# Patient Record
Sex: Male | Born: 1940
Health system: Southern US, Community
[De-identification: ages and names within clinical notes are randomized; demographics above are authoritative.]

## PROBLEM LIST (undated history)

## (undated) DIAGNOSIS — E785 Hyperlipidemia, unspecified: Secondary | ICD-10-CM

## (undated) DIAGNOSIS — I10 Essential (primary) hypertension: Secondary | ICD-10-CM

## (undated) DIAGNOSIS — F32A Depression, unspecified: Secondary | ICD-10-CM

## (undated) DIAGNOSIS — I4891 Unspecified atrial fibrillation: Secondary | ICD-10-CM

## (undated) DIAGNOSIS — Z85820 Personal history of malignant melanoma of skin: Secondary | ICD-10-CM

## (undated) DIAGNOSIS — F329 Major depressive disorder, single episode, unspecified: Secondary | ICD-10-CM

## (undated) DIAGNOSIS — N486 Induration penis plastica: Secondary | ICD-10-CM

## (undated) HISTORY — PX: TONSILLECTOMY: SUR1361

## (undated) HISTORY — DX: Hyperlipidemia, unspecified: E78.5

## (undated) HISTORY — DX: Essential (primary) hypertension: I10

## (undated) HISTORY — DX: Personal history of malignant melanoma of skin: Z85.820

## (undated) HISTORY — PX: MELANOMA EXCISION: SHX5266

## (undated) HISTORY — PX: OTHER SURGICAL HISTORY: SHX169

## (undated) HISTORY — DX: Depression, unspecified: F32.A

## (undated) HISTORY — DX: Unspecified atrial fibrillation: I48.91

## (undated) HISTORY — DX: Major depressive disorder, single episode, unspecified: F32.9

## (undated) HISTORY — PX: KNEE SURGERY: SHX244

## (undated) HISTORY — DX: Induration penis plastica: N48.6

---

## 2005-08-27 ENCOUNTER — Ambulatory Visit: Payer: Self-pay | Admitting: Gastroenterology

## 2005-09-07 ENCOUNTER — Ambulatory Visit: Payer: Self-pay | Admitting: Gastroenterology

## 2006-07-07 ENCOUNTER — Emergency Department (HOSPITAL_COMMUNITY): Admission: EM | Admit: 2006-07-07 | Discharge: 2006-07-08 | Payer: Self-pay | Admitting: Emergency Medicine

## 2006-07-07 ENCOUNTER — Ambulatory Visit: Payer: Self-pay | Admitting: *Deleted

## 2006-09-30 ENCOUNTER — Ambulatory Visit: Payer: Self-pay | Admitting: Cardiology

## 2006-10-24 ENCOUNTER — Ambulatory Visit: Payer: Self-pay

## 2007-03-24 ENCOUNTER — Ambulatory Visit: Payer: Self-pay | Admitting: Cardiology

## 2009-05-06 ENCOUNTER — Emergency Department (HOSPITAL_COMMUNITY): Admission: EM | Admit: 2009-05-06 | Discharge: 2009-05-06 | Payer: Self-pay | Admitting: Emergency Medicine

## 2010-03-24 LAB — COMPREHENSIVE METABOLIC PANEL
ALT: 26 U/L (ref 0–53)
AST: 33 U/L (ref 0–37)
Alkaline Phosphatase: 53 U/L (ref 39–117)
CO2: 27 mEq/L (ref 19–32)
Calcium: 9.3 mg/dL (ref 8.4–10.5)
Chloride: 102 mEq/L (ref 96–112)
GFR calc Af Amer: >60 mL/min (ref >60)
Potassium: 3.7 mEq/L (ref 3.5–5.1)
Sodium: 138 mEq/L (ref 135–145)

## 2010-03-24 LAB — LIPASE, BLOOD: Lipase: 28 U/L (ref 11–59)

## 2010-03-24 LAB — URINALYSIS, ROUTINE W REFLEX MICROSCOPIC
Glucose, UA: NEGATIVE mg/dL
Nitrite: NEGATIVE
Protein, ur: NEGATIVE mg/dL

## 2010-03-24 LAB — CBC
HCT: 43.4 % (ref 39.0–52.0)
MCV: 95.1 fL (ref 78.0–100.0)
Platelets: 126 10*3/uL — ABNORMAL LOW (ref 150–400)
WBC: 8.8 10*3/uL (ref 4.0–10.5)

## 2010-03-24 LAB — DIFFERENTIAL
Basophils Absolute: 0 10*3/uL (ref 0.0–0.1)
Eosinophils Absolute: 0 10*3/uL (ref 0.0–0.7)
Eosinophils Relative: 0 % (ref 0–5)
Lymphocytes Relative: 6 % — ABNORMAL LOW (ref 12–46)
Monocytes Absolute: 0.7 10*3/uL (ref 0.1–1.0)
Monocytes Relative: 8 % (ref 3–12)
Neutro Abs: 7.6 10*3/uL (ref 1.7–7.7)
Neutrophils Relative %: 86 % — ABNORMAL HIGH (ref 43–77)

## 2010-05-19 NOTE — Assessment & Plan Note (Signed)
[facility] HEALTHCARE                            CARDIOLOGY OFFICE NOTE   MAXUM, CASSARINO                  MRN:          [id number]  DATE:09/30/2006                            DOB:          1940-04-18    PRIMARY CARE PHYSICIAN:  Dr. Elias Else.   REASON FOR PRESENTATION:  Evaluate patient with atrial fibrillation.   HISTORY OF PRESENT ILLNESS:  The patient presents after an emergency  room visit on July 3 with tachy palpitations documented to be atrial  fibrillation.  He was seen in consultation by our group.  He had had 2  beers and 4 glasses of wine that evening.  This was felt to be related  to the onset of his rapid atrial fibrillation.  He was treated with a  Diltiazem drip.  Subsequently, he had resolution of his arrhythmia.  He  has not had any further tachy palpitations.  He denies any presyncope or  syncope.  He does not exercise routinely, but he works quite a bit in  his yard.  He does heavy lifting.  He denies any chest pressure, neck  discomfort, arm discomfort, activity-induced nausea and vomiting,  excessive diaphoresis.  He has had no shortness of breath, PND or  orthopnea.  He does drink, sometimes, 4 glasses of wine a night or  slightly more.   PAST MEDICAL HISTORY:  1. Hyperlipidemia.  2. Atrial fibrillation in 2003.   PAST SURGICAL HISTORY:  1. Left knee surgery.  2. Tonsillectomy.  3. Polypectomy.   ALLERGIES:  None.   MEDICATIONS:  1. Lipitor 40 mg daily.  2. Aspirin 325 mg daily.   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  He is married.  He occasionally smokes cigars.  He quit  smoking cigarettes 20 years ago.   FAMILY HISTORY:  Noncontributory for coronary disease.   REVIEW OF SYSTEMS:  As stated in the HPI and otherwise negative for  other systems.   PHYSICAL EXAMINATION:  The patient is in no distress.  Blood pressure 147/84, heart rate 62 and regular, weight 168  pounds,  body mass index 26.  HEENT:  Eyelids unremarkable, pupils are equal, round, and reactive to  light, fundi not visualized.  Oral mucosa unremarkable.  NECK:  No jugular venous distension at 45 degrees, carotid upstroke  brisk and symmetrical.  No bruits.  No thyromegaly.  LYMPHATICS:  No cervical, axillary, inguinal adenopathy.  LUNGS:  Clear to auscultation bilaterally.  BACK:  No costovertebral angle tenderness.  CHEST:  Unremarkable.  HEART:  PMI not displaced or sustained.  S1 and S2 are within normal  limits.  No S3, no S4.  No clicks, no rubs, no murmurs.  ABDOMEN:  Flat, positive bowel sounds, normal in frequency and pitch.  No bruits, no rebound, no guarding.  No midline pulsatile mass.  No  hepatomegaly, no splenomegaly.  SKIN:  No rashes, no nodules.  EXTREMITIES:  2+ pulses throughout, no edema.  NEURO:  Oriented to person, place, and time.  Cranial nerves II-XII  grossly intact, motor grossly intact.   EKG sinus  rhythm, rate 63, axis within normal limits, intervals within  normal limits, no acute ST-wave change.   ASSESSMENT AND PLAN:  1. Atrial fibrillation:  The patient had a recurrent paroxysm of this.      This is his 2nd one.  It has been 5 years between bouts.  At this      point I am going to check an echocardiogram.  He is encouraged not      to drink as much alcohol, as this may have contributed.  At this      point, he has no high risk features for an embolism, and would not      meet absolute criteria for Coumadin.  He would not want to take      this.  He is not taking the Cardizem that was prescription for him.      We discussed a pill in pocket approach.  He will let me know fi      he has any further palpitations.  2. Hypertension:  Blood pressure is slightly elevated but this has not      been an issue previously.  He is going to keep an eye on this and      has access to a blood pressure cuff.  If it is slightly elevated at      that point, he  would take the Cardizem.  3. Alcohol:  We did discuss reducing his alcohol intake to a healthier      level.  4. Followup:  To see him back in 6 months or sooner if needed.     [name], MD, [facility]  Electronically Signed    JH/MedQ  DD: 09/30/2006  DT: 10/01/2006  Job #: [id number]   cc:   [name], M.D.

## 2010-05-19 NOTE — Assessment & Plan Note (Signed)
[facility] HEALTHCARE                                 ON-CALL NOTE   Jose Fuentes, Jose Fuentes                       MRN:          [id number]  DATE:07/07/2006                            DOB:          [birth year]    TELEPHONE CONTACT NOTE   Jose Fuentes states that he saw Dr. Antoine Poche approximately 5 years ago  for some sort of arrhythmia.  He cannot remember exactly what it was,  but he stated that Dr. Antoine Poche told him that he did not need to be seen  again, except on a p.r.n. basis, and it would probably never happen  again.  Today, Jose Fuentes states he had onset of tachy palpitations.  He states that they are causing him some weakness and shortness of  breath, and he is a little bit dizzy.  I advised him that he would need  to come to the hospital to be evaluated, and I requested that he call 9-  1-1 to do so, as I did not feel that he was safe to drive and did not  feel comfortable with anyone else driving him.  He was in agreement with  this as a plan of care.      Jose Demark, PA-C  Electronically Signed      Madolyn Frieze. Jens Som, MD, [facility]  Electronically Signed   RB/MedQ  DD: 07/07/2006  DT: 07/08/2006  Job #: [id number]

## 2010-05-19 NOTE — Assessment & Plan Note (Signed)
[facility] HEALTHCARE                            CARDIOLOGY OFFICE NOTE   Jose Fuentes, Jose Fuentes                  MRN:          [id number]  DATE:03/24/2007                            DOB:          1940/05/13    PRIMARY PHYSICIAN:  Jose Fuentes, M.D.   REASON FOR PRESENTATION:  A patient with atrial fibrillation and  hypertension.   HISTORY OF PRESENT ILLNESS:  The patient is 70 years old.  He returns  for followup of the above.  He has not had any palpitations since I last  saw him.  He has not required any Cardizem.  He denies any presyncope or  syncope.  He has had no chest pain or shortness of breath.  He works in  his yard and does activities in his wood shop but does not exercise as  routinely as I would hope.  Of note he has been keeping his blood  pressure diary.  His blood pressures typically run above 140 systolic.   PAST MEDICAL HISTORY:  Hyperlipidemia, hypertension, now being  diagnosed, atrial fibrillation paroxysms, left knee surgery,  tonsillectomy, polypectomy.   ALLERGIES:  None.   MEDICATIONS:  1. Lipitor 40 mg daily at bedtime.  2. Aspirin 325 mg daily.   REVIEW OF SYSTEMS:  As stated in the HPI and otherwise negative for  other systems.   PHYSICAL EXAMINATION:  GENERAL:  The patient is in no distress.  VITAL SIGNS:  Blood pressure 145/78, heart rate 65 and regular, weight  173 pounds.  HEENT:  Eyelids unremarkable.  Pupils equal, round and reactive.  Fundi  not visualized.  Oral mucosa normal.  NECK:  No jugular venous distention at 45 degrees.  Carotid upstroke  brisk and symmetrical.  No bruits.  No thyromegaly.  LYMPHATICS:  No cervical, axillary or inguinal adenopathy.  LUNGS:  Clear to auscultation bilaterally.  BACK:  No costovertebral angle tenderness.  CHEST:  Unremarkable.  HEART:  PMI not displaced or sustained.  S1 and S2 within normal limits.  No S3, no S4, no clicks, rubs, murmurs.  ABDOMEN:  Flat, positive  bowel sounds.  Normal in frequency, pitch, no  bruits, no rebound, no guarding, no midline pulsatile mass.  No  hepatomegaly, no splenomegaly.  SKIN:  No rashes, no nodules.  EXTREMITIES:  2+ pulses throughout.  No edema, cyanosis, clubbing.  NEURO:  Oriented to person, place and time.  Cranial nerves II-XII  grossly intact.  Motor grossly intact.   EKG sinus rhythm, rate 61, axis within normal limits, intervals within  normal limits.  No acute ST-wave changes.   ASSESSMENT AND PLAN:  1. Atrial fibrillation.  The patient has not had any symptomatic      arrhythmias.  No further cardiovascular testing is suggested.  He      has Cardizem at home should he need it for any sustained      symptomatic arrhythmias.  2. Hypertension.  He does have high blood pressure.  Given this I am      going to prescribe hydrochlorothiazide 12.5 mg daily.  We will get  a BMET in 2 weeks and has been given written instructions for this.      We also discussed therapeutic lifestyle changes to include less      alcohol as he is still drinking 4 glasses of wine a night.  This      and increased physical activity can bring down his pressure and may      allow him to come off the drugs in the future.  3. Followup.  He can come back to this clinic as needed.  He will have      his blood pressure followed at Dr. Benjaman Fuentes.     Jose Rotunda, MD, [facility]  Electronically Signed    JH/MedQ  DD: 03/24/2007  DT: 03/24/2007  Job #: [id number]   cc:   Jose Fuentes, M.D.

## 2010-05-19 NOTE — Consult Note (Signed)
NAME:  Jose Fuentes, Jose Fuentes NO.:  [id number]   MEDICAL RECORD NO.:  [id number]          PATIENT TYPE:  EMS   LOCATION:  [facility]                         FACILITY:  [facility]   PHYSICIAN:  Madolyn Frieze. Jens Som, MD, FACCDATE OF BIRTH:  [birth year]   DATE OF CONSULTATION:  07/08/2006  DATE OF DISCHARGE:                                 CONSULTATION   CHIEF COMPLAINT:  Palpitations.   HISTORY OF PRESENT ILLNESS:  This is a 70 year old gentleman with a  history of hyperlipidemia, who approximately 5 years ago had an episode  of atrial fibrillation that was self-limited while helping his mom move  in [place].  He says at that time he was under a significant amount of  stress dealing with the move with his mother.  He was seen in the  emergency room after developing palpitations and was diagnosed with  atrial fibrillation.  Received a shot of what sounds like probably  diltiazem and at that time cardioverted back into sinus rhythm.  When he  returned back home here to the [place] area, he saw Dr. Antoine Poche who  followed the patient and stated that most likely the patient would never  have another episode.  The patient states in the interim he has had  occasion episodes of self-limited palpitations lasting only a couple of  seconds.   This evening, the patient had 2 beers earlier in the day and 4 glasses  of wine and shortly thereafter developed the onset of palpitations.  They lasted for more than 5 minutes and he became a little bit concerned  as a result.  He told his wife and they came to the emergency  department.  In the emergency department, he was found to be in atrial  fibrillation with a rapid ventricular response with a ventricular rate  in the 150s.  As a result, he was started on a diltiazem drip with  adequate rate control of his atrial fibrillation and resolution of the  majority of his symptoms.  He denies any shortness of breath, chest  pain, lightheadedness, dizziness,  focal weakness, slurred speech, or any  vision loss.  At this point in time, he denies any other symptoms at all  such as nausea, vomiting, diaphoresis.  Additionally he denies any  problems with dyspnea on exertion at baseline, chest pain or angina,  lower extremity edema, orthopnea, or PND.  Additionally, the patient has  a plane ticket to go to [place] tomorrow at 2 p.m. that he is not  interested in rescheduling.   REVIEW OF SYSTEMS:  As above in the HPI.  The remaining 8-point review  of systems is negative.   ALLERGIES:  None.   PAST MEDICAL HISTORY:  1. Hyperlipidemia.  2. Remote history of atrial fibrillation.   CURRENT MEDICATIONS:  Aspirin 325 mg daily, Lipitor daily.   SOCIAL HISTORY:  He is an occasional drinker.  He denies any tobacco  abuse.  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  He lives with his wife.   FAMILY HISTORY:  Noncontributory.   PHYSICAL EXAMINATION:  VITAL SIGNS:  Blood pressure is 121/71, heart  rate is between 79 and 100 currently in atrial fibrillation, O2  saturations are 99% on room air.  He is afebrile.  GENERAL:  He is alert, oriented x3.  No acute distress.  HEENT EXAM:  Normocephalic, atraumatic.  Pupils equal, round, and  reactive to light.  Oropharynx is clear.  Sclerae:  Anicteric.  NECK:  JVP is flat.  No carotid bruits, with 2+ carotids and pulses  symmetric bilaterally.  LUNGS:  Clear to auscultation bilaterally without any wheezes, rhonchi,  or rales.  CARDIOVASCULAR EXAM:  Irregularly irregular, normal S2, without any  murmurs, rubs, or gallops.  ABDOMEN:  Soft, nontender, nondistended, with positive bowel sounds.  EXTREMITY EXAM:  With 2+ radial and posterior tibialis pulses symmetric  bilaterally without any lower extremity edema, clubbing, or cyanosis.  NEUROLOGIC EXAM:  Alert, oriented x3.  Cranial nerves, III through XII,  are intact.  He has 5/5 motor strength throughout.   LABORATORY  DATA:  Cardiac enzymes are negative.  Chem-7 is only  remarkable for a potassium of 3.6.  The remainder of his electrolytes  are within normal limits.  CBC is unremarkable.  A 12-lead EKG initially  showed atrial fibrillation with a ventricular rate of 152, with a normal  axis, without any identifiable Q waves or T wave abnormalities.  He has  some small ST depression in the inferior lateral lead that are difficult  to interpret given his rate.   ASSESSMENT:  1. History of paroxysmal atrial fibrillation, now with a diagnosis of      holiday heart.  2. Hyperlipidemia.   RECOMMENDATIONS:  This patient comes in with a paroxysm of atrial  fibrillation.  It has been controlled now with IV diltiazem in the  emergency department.  He has had a prior history of a diagnosis of  atrial fibrillation.  He currently has no significant risk factors for  stroke, other than his atrial fibrillation.  As a result, it would be  reasonable to continue to treat this gentleman as he is with aspirin 325  mg a day.  At this point, he needs some additional help with rate  control.  As a result, I have sent him home with a prescription of  diltiazem 30 mg p.o. every 6 hours p.r.n. until his heart rate remains  less than 100 or he is back in a regular rhythm.  I have given him  instructions on how to use this medication as well as to continue with  the full-dose aspirin.  I have told him that it would be safe for him to  continue on his trip tomorrow if he felt necessary.  Given I think that  most likely he will cardiovert back on his own and he already has stroke  prophylaxis on board, I have asked him to return to see Dr. Antoine Poche for  follow up as an outpatient.     ______________________________  Eston Esters. Sherryll Burger, MD      Madolyn Frieze. Jens Som, MD, [facility]  Electronically Signed    BRS/MEDQ  D:  07/08/2006  T:  07/08/2006  Job:  [id number]   cc:   [name], MD, [facility]

## 2010-10-20 LAB — DIFFERENTIAL
Basophils Relative: 1
Lymphocytes Relative: 31
Lymphs Abs: 1.6
Neutro Abs: 2.8
Neutrophils Relative %: 56

## 2010-10-20 LAB — CBC
HCT: 46.8
Hemoglobin: 15.9
MCHC: 33.9
MCV: 93.1
Platelets: 152
RBC: 5.02

## 2010-10-20 LAB — I-STAT 8, (EC8 V) (CONVERTED LAB)
HCT: 51
Operator id: 284251
Potassium: 3.6
Sodium: 142
TCO2: 22
pCO2, Ven: 33.2 — ABNORMAL LOW
pH, Ven: 7.411 — ABNORMAL HIGH

## 2010-10-20 LAB — POCT CARDIAC MARKERS
CKMB, poc: 1.5
Myoglobin, poc: 44.8
Troponin i, poc: <0.05

## 2010-10-20 LAB — POCT I-STAT CREATININE
Creatinine, Ser: 0.9
Operator id: 284251

## 2011-02-23 ENCOUNTER — Other Ambulatory Visit: Payer: Self-pay | Admitting: *Deleted

## 2011-06-08 DIAGNOSIS — I10 Essential (primary) hypertension: Secondary | ICD-10-CM | POA: Diagnosis not present

## 2011-06-08 DIAGNOSIS — Z1331 Encounter for screening for depression: Secondary | ICD-10-CM | POA: Diagnosis not present

## 2011-06-08 DIAGNOSIS — F329 Major depressive disorder, single episode, unspecified: Secondary | ICD-10-CM | POA: Diagnosis not present

## 2011-06-08 DIAGNOSIS — E785 Hyperlipidemia, unspecified: Secondary | ICD-10-CM | POA: Diagnosis not present

## 2011-08-06 DIAGNOSIS — E785 Hyperlipidemia, unspecified: Secondary | ICD-10-CM | POA: Diagnosis not present

## 2011-08-06 DIAGNOSIS — I10 Essential (primary) hypertension: Secondary | ICD-10-CM | POA: Diagnosis not present

## 2011-09-10 DIAGNOSIS — H11449 Conjunctival cysts, unspecified eye: Secondary | ICD-10-CM | POA: Diagnosis not present

## 2011-09-10 DIAGNOSIS — H5210 Myopia, unspecified eye: Secondary | ICD-10-CM | POA: Diagnosis not present

## 2011-09-10 DIAGNOSIS — H02059 Trichiasis without entropian unspecified eye, unspecified eyelid: Secondary | ICD-10-CM | POA: Diagnosis not present

## 2011-09-10 DIAGNOSIS — H2510 Age-related nuclear cataract, unspecified eye: Secondary | ICD-10-CM | POA: Diagnosis not present

## 2011-09-20 DIAGNOSIS — Z23 Encounter for immunization: Secondary | ICD-10-CM | POA: Diagnosis not present

## 2011-10-04 DIAGNOSIS — H02059 Trichiasis without entropian unspecified eye, unspecified eyelid: Secondary | ICD-10-CM | POA: Diagnosis not present

## 2014-01-14 DIAGNOSIS — H43813 Vitreous degeneration, bilateral: Secondary | ICD-10-CM | POA: Diagnosis not present

## 2014-01-14 DIAGNOSIS — H5310 Unspecified subjective visual disturbances: Secondary | ICD-10-CM | POA: Diagnosis not present

## 2014-01-14 DIAGNOSIS — H2513 Age-related nuclear cataract, bilateral: Secondary | ICD-10-CM | POA: Diagnosis not present

## 2014-01-28 DIAGNOSIS — H43812 Vitreous degeneration, left eye: Secondary | ICD-10-CM | POA: Diagnosis not present

## 2014-02-15 DIAGNOSIS — H43812 Vitreous degeneration, left eye: Secondary | ICD-10-CM | POA: Diagnosis not present

## 2014-04-29 DIAGNOSIS — R7301 Impaired fasting glucose: Secondary | ICD-10-CM | POA: Diagnosis not present

## 2014-04-29 DIAGNOSIS — B001 Herpesviral vesicular dermatitis: Secondary | ICD-10-CM | POA: Diagnosis not present

## 2014-04-29 DIAGNOSIS — D696 Thrombocytopenia, unspecified: Secondary | ICD-10-CM | POA: Diagnosis not present

## 2014-04-29 DIAGNOSIS — Z0000 Encounter for general adult medical examination without abnormal findings: Secondary | ICD-10-CM | POA: Diagnosis not present

## 2014-04-29 DIAGNOSIS — Z1211 Encounter for screening for malignant neoplasm of colon: Secondary | ICD-10-CM | POA: Diagnosis not present

## 2014-04-29 DIAGNOSIS — I10 Essential (primary) hypertension: Secondary | ICD-10-CM | POA: Diagnosis not present

## 2014-04-29 DIAGNOSIS — E785 Hyperlipidemia, unspecified: Secondary | ICD-10-CM | POA: Diagnosis not present

## 2014-04-29 DIAGNOSIS — Z125 Encounter for screening for malignant neoplasm of prostate: Secondary | ICD-10-CM | POA: Diagnosis not present

## 2014-04-29 DIAGNOSIS — F325 Major depressive disorder, single episode, in full remission: Secondary | ICD-10-CM | POA: Diagnosis not present

## 2014-04-29 DIAGNOSIS — I4891 Unspecified atrial fibrillation: Secondary | ICD-10-CM | POA: Diagnosis not present

## 2014-04-30 ENCOUNTER — Telehealth: Payer: Self-pay | Admitting: Cardiology

## 2014-04-30 NOTE — Telephone Encounter (Signed)
Received fax referral packet 10 pages from [place] at [facility] for up coming appointment on 06/11/2014. Records given to [facility].

## 2014-05-02 ENCOUNTER — Telehealth: Payer: Self-pay | Admitting: Gastroenterology

## 2014-05-02 NOTE — Telephone Encounter (Signed)
Dr. Ardis Hughs reviewed pt's last procedure with Dr. Velora Heckler.  Pt will be due for colonoscopy in 09/2015.  Told wife we would send a letter when that schedule comes out.

## 2014-06-10 ENCOUNTER — Telehealth: Payer: Self-pay | Admitting: Cardiology

## 2014-06-10 NOTE — Telephone Encounter (Signed)
Received records from [place] for appointment on 06/11/14 with Dr Percival Spanish. Records given to [facility] (medical records) for Dr Hochrein's schedule on 06/11/14. lp

## 2014-06-11 ENCOUNTER — Ambulatory Visit (INDEPENDENT_AMBULATORY_CARE_PROVIDER_SITE_OTHER): Payer: Medicare Other | Admitting: Cardiology

## 2014-06-11 ENCOUNTER — Encounter: Payer: Self-pay | Admitting: Cardiology

## 2014-06-11 VITALS — BP 128/70 | HR 63 | Ht 66.5 in | Wt 167.6 lb

## 2014-06-11 DIAGNOSIS — I480 Paroxysmal atrial fibrillation: Secondary | ICD-10-CM | POA: Insufficient documentation

## 2014-06-11 DIAGNOSIS — I4891 Unspecified atrial fibrillation: Secondary | ICD-10-CM | POA: Diagnosis not present

## 2014-06-11 NOTE — Patient Instructions (Signed)
Your physician recommends that you schedule a follow-up appointment in: one year with Dr. Hochrein  

## 2014-06-11 NOTE — Progress Notes (Signed)
Cardiology Office Note   Date:  06/11/2014   ID:  Griff, Badley [birth year], MRN [id number]  PCP:  Vidal Schwalbe, MD  Cardiologist:   Minus Breeding, MD   Chief Complaint  Patient presents with  . Atrial Fibrillation      History of Present Illness: Jose Fuentes is a 74 y.o. male who presents for the patient presents for follow up of atrial fib.  He has had sustained episodes of this twice in the past but not since I saw him last.  He has done well.  The patient denies any new symptoms such as chest discomfort, neck or arm discomfort. There has been no new shortness of breath, PND or orthopnea. There have been no reported palpitations, presyncope or syncope.  He stays active in two properties.      No past medical history on file.  No past surgical history on file.   Current Outpatient Prescriptions  Medication Sig Dispense Refill  . aspirin 325 MG tablet Take 325 mg by mouth daily.    Marland Kitchen atorvastatin (LIPITOR) 80 MG tablet Take 1 tablet by mouth daily.    Marland Kitchen buPROPion (WELLBUTRIN XL) 300 MG 24 hr tablet Take 1 tablet by mouth.    . cholecalciferol (VITAMIN D) 1000 UNITS tablet Take 1,000 Units by mouth 2 (two) times daily.    Marland Kitchen diltiazem (CARDIZEM) 60 MG tablet Take 60 mg by mouth 4 (four) times daily.    . hydrochlorothiazide (MICROZIDE) 12.5 MG capsule Take 1 capsule by mouth daily.    . vitamin B-12 (CYANOCOBALAMIN) 1000 MCG tablet Take 1,000 mcg by mouth daily.    Marland Kitchen ZETIA 10 MG tablet Take 1 tablet by mouth daily.     No current facility-administered medications for this visit.    Allergies:   Review of patient's allergies indicates not on file.    Social History:  The patient  reports that he has never smoked. He does not have any smokeless tobacco history on file. He reports that he does not use illicit drugs.   Family History:  The patient's family history includes Brain cancer in an other family member; Heart attack in his father.    ROS:   Please see the history of present illness.   Otherwise, review of systems are positive for none.   All other systems are reviewed and negative.    PHYSICAL EXAM: VS:  BP 128/70 mmHg  Pulse 63  Ht 5' 6.5" (1.689 m)  Wt 167 lb 9.6 oz (76.023 kg)  BMI 26.65 kg/m2 , BMI Body mass index is 26.65 kg/(m^2). GENERAL:  Well appearing HEENT:  Pupils equal round and reactive, fundi not visualized, oral mucosa unremarkable NECK:  No jugular venous distention, waveform within normal limits, carotid upstroke brisk and symmetric, no bruits, no thyromegaly LYMPHATICS:  No cervical, inguinal adenopathy LUNGS:  Clear to auscultation bilaterally BACK:  No CVA tenderness CHEST:  Unremarkable HEART:  PMI not displaced or sustained,S1 and S2 within normal limits, no S3, no S4, no clicks, no rubs, no murmurs ABD:  Flat, positive bowel sounds normal in frequency in pitch, no bruits, no rebound, no guarding, no midline pulsatile mass, no hepatomegaly, no splenomegaly EXT:  2 plus pulses throughout, no edema, no cyanosis no clubbing SKIN:  No rashes no nodules NEURO:  Cranial nerves II through XII grossly intact, motor grossly intact throughout PSYCH:  Cognitively intact, oriented to person place and time    EKG:  EKG is ordered today.  The ekg ordered today demonstrates sinus rhythm, rate 63, axis within normal limits, intervals within normal limits, no acute ST-T wave changes.   Recent Labs: No results found for requested labs within last 365 days.    Lipid Panel No results found for: CHOL, TRIG, HDL, CHOLHDL, VLDL, LDLCALC, LDLDIRECT    Wt Readings from Last 3 Encounters:  06/11/14 167 lb 9.6 oz (76.023 kg)      Other studies Reviewed: Additional studies/ records that were reviewed today include: Office records. Review of the above records demonstrates:  Please see elsewhere in the note.     ASSESSMENT AND PLAN:  ATRIAL FIB:  The patient denies any recent episodes of sustained dysrhythmia.  No change in therapy is indicated. No further testing is planned.  DYSLIPIDEMIA:  We talked about this for quite a while. Because his last LDL was 163 I believe he should be on a statin. He hadn't been taking his Lipitor consistently and he has not started this. I think he should stop his Zetia. He can get his lipid profile checked again in about 10 weeks. If he's not at target of an LDL less than 100 I would put him on a higher dose of statin or change to Crestor.   Current medicines are reviewed at length with the patient today.  The patient does not have concerns regarding medicines.  The following changes have been made:  Stop Zetia  Labs/ tests ordered today include:   Orders Placed This Encounter  Procedures  . EKG 12-Lead     Disposition:   FU with me as needed    Signed, Minus Breeding, MD  06/11/2014 5:42 PM    [facility]

## 2014-06-24 DIAGNOSIS — Z1211 Encounter for screening for malignant neoplasm of colon: Secondary | ICD-10-CM | POA: Diagnosis not present

## 2014-08-23 DIAGNOSIS — H43812 Vitreous degeneration, left eye: Secondary | ICD-10-CM | POA: Diagnosis not present

## 2014-09-12 ENCOUNTER — Ambulatory Visit
Admission: RE | Admit: 2014-09-12 | Discharge: 2014-09-12 | Disposition: A | Discharge status: Home / Self Care | Payer: Medicare Other | Source: Ambulatory Visit | Attending: Family Medicine | Admitting: Family Medicine

## 2014-09-12 ENCOUNTER — Other Ambulatory Visit: Payer: Self-pay | Admitting: Family Medicine

## 2014-09-12 DIAGNOSIS — S2239XA Fracture of one rib, unspecified side, initial encounter for closed fracture: Secondary | ICD-10-CM | POA: Diagnosis not present

## 2014-09-12 DIAGNOSIS — S2232XA Fracture of one rib, left side, initial encounter for closed fracture: Secondary | ICD-10-CM

## 2014-09-12 DIAGNOSIS — R0781 Pleurodynia: Secondary | ICD-10-CM | POA: Diagnosis not present

## 2014-09-12 DIAGNOSIS — R079 Chest pain, unspecified: Secondary | ICD-10-CM | POA: Diagnosis not present

## 2014-09-12 DIAGNOSIS — T148 Other injury of unspecified body region: Secondary | ICD-10-CM | POA: Diagnosis not present

## 2014-09-23 DIAGNOSIS — E785 Hyperlipidemia, unspecified: Secondary | ICD-10-CM | POA: Diagnosis not present

## 2014-10-28 DIAGNOSIS — D696 Thrombocytopenia, unspecified: Secondary | ICD-10-CM | POA: Diagnosis not present

## 2014-10-28 DIAGNOSIS — E785 Hyperlipidemia, unspecified: Secondary | ICD-10-CM | POA: Diagnosis not present

## 2014-10-28 DIAGNOSIS — I10 Essential (primary) hypertension: Secondary | ICD-10-CM | POA: Diagnosis not present

## 2014-10-28 DIAGNOSIS — Z23 Encounter for immunization: Secondary | ICD-10-CM | POA: Diagnosis not present

## 2014-10-28 DIAGNOSIS — R7301 Impaired fasting glucose: Secondary | ICD-10-CM | POA: Diagnosis not present

## 2014-10-28 DIAGNOSIS — F339 Major depressive disorder, recurrent, unspecified: Secondary | ICD-10-CM | POA: Diagnosis not present

## 2014-11-01 DIAGNOSIS — H43812 Vitreous degeneration, left eye: Secondary | ICD-10-CM | POA: Diagnosis not present

## 2014-11-01 DIAGNOSIS — Z0100 Encounter for examination of eyes and vision without abnormal findings: Secondary | ICD-10-CM | POA: Diagnosis not present

## 2014-11-01 DIAGNOSIS — H2513 Age-related nuclear cataract, bilateral: Secondary | ICD-10-CM | POA: Diagnosis not present

## 2014-12-02 DIAGNOSIS — F324 Major depressive disorder, single episode, in partial remission: Secondary | ICD-10-CM | POA: Diagnosis not present

## 2014-12-02 DIAGNOSIS — I10 Essential (primary) hypertension: Secondary | ICD-10-CM | POA: Diagnosis not present

## 2015-01-20 DIAGNOSIS — F325 Major depressive disorder, single episode, in full remission: Secondary | ICD-10-CM | POA: Diagnosis not present

## 2015-01-20 DIAGNOSIS — R002 Palpitations: Secondary | ICD-10-CM | POA: Diagnosis not present

## 2015-03-24 DIAGNOSIS — L720 Epidermal cyst: Secondary | ICD-10-CM | POA: Diagnosis not present

## 2015-03-24 DIAGNOSIS — D2261 Melanocytic nevi of right upper limb, including shoulder: Secondary | ICD-10-CM | POA: Diagnosis not present

## 2015-03-24 DIAGNOSIS — D1801 Hemangioma of skin and subcutaneous tissue: Secondary | ICD-10-CM | POA: Diagnosis not present

## 2015-03-24 DIAGNOSIS — D485 Neoplasm of uncertain behavior of skin: Secondary | ICD-10-CM | POA: Diagnosis not present

## 2015-03-24 DIAGNOSIS — D2272 Melanocytic nevi of left lower limb, including hip: Secondary | ICD-10-CM | POA: Diagnosis not present

## 2015-03-24 DIAGNOSIS — L738 Other specified follicular disorders: Secondary | ICD-10-CM | POA: Diagnosis not present

## 2015-03-24 DIAGNOSIS — L570 Actinic keratosis: Secondary | ICD-10-CM | POA: Diagnosis not present

## 2015-03-24 DIAGNOSIS — D2262 Melanocytic nevi of left upper limb, including shoulder: Secondary | ICD-10-CM | POA: Diagnosis not present

## 2015-03-24 DIAGNOSIS — D692 Other nonthrombocytopenic purpura: Secondary | ICD-10-CM | POA: Diagnosis not present

## 2015-03-24 DIAGNOSIS — L814 Other melanin hyperpigmentation: Secondary | ICD-10-CM | POA: Diagnosis not present

## 2015-03-24 DIAGNOSIS — Z85820 Personal history of malignant melanoma of skin: Secondary | ICD-10-CM | POA: Diagnosis not present

## 2015-03-24 DIAGNOSIS — L821 Other seborrheic keratosis: Secondary | ICD-10-CM | POA: Diagnosis not present

## 2015-03-24 DIAGNOSIS — D225 Melanocytic nevi of trunk: Secondary | ICD-10-CM | POA: Diagnosis not present

## 2015-03-31 DIAGNOSIS — H43811 Vitreous degeneration, right eye: Secondary | ICD-10-CM | POA: Diagnosis not present

## 2015-04-25 DIAGNOSIS — H43811 Vitreous degeneration, right eye: Secondary | ICD-10-CM | POA: Diagnosis not present

## 2015-04-29 DIAGNOSIS — H43813 Vitreous degeneration, bilateral: Secondary | ICD-10-CM | POA: Diagnosis not present

## 2015-04-29 DIAGNOSIS — H33101 Unspecified retinoschisis, right eye: Secondary | ICD-10-CM | POA: Diagnosis not present

## 2015-05-02 DIAGNOSIS — R29818 Other symptoms and signs involving the nervous system: Secondary | ICD-10-CM | POA: Diagnosis not present

## 2015-05-02 DIAGNOSIS — F325 Major depressive disorder, single episode, in full remission: Secondary | ICD-10-CM | POA: Diagnosis not present

## 2015-05-02 DIAGNOSIS — I10 Essential (primary) hypertension: Secondary | ICD-10-CM | POA: Diagnosis not present

## 2015-05-02 DIAGNOSIS — R296 Repeated falls: Secondary | ICD-10-CM | POA: Diagnosis not present

## 2015-05-02 DIAGNOSIS — R7309 Other abnormal glucose: Secondary | ICD-10-CM | POA: Diagnosis not present

## 2015-05-26 ENCOUNTER — Ambulatory Visit: Payer: Medicare Other | Attending: Family Medicine | Admitting: Physical Therapy

## 2015-05-26 ENCOUNTER — Encounter: Payer: Self-pay | Admitting: Physical Therapy

## 2015-05-26 DIAGNOSIS — R2689 Other abnormalities of gait and mobility: Secondary | ICD-10-CM | POA: Diagnosis not present

## 2015-05-26 DIAGNOSIS — R2681 Unsteadiness on feet: Secondary | ICD-10-CM | POA: Insufficient documentation

## 2015-05-26 DIAGNOSIS — M6281 Muscle weakness (generalized): Secondary | ICD-10-CM | POA: Diagnosis not present

## 2015-05-26 NOTE — Therapy (Signed)
[facility] [address] [place], [place], [address] Phone: [phone]   Fax:  [phone]  Physical Therapy Evaluation  Patient Details  Name: Jose Fuentes MRN: [id number] Date of Birth: [birth year] Referring Provider: Harlan Stains, MD  Encounter Date: 05/26/2015      PT End of Session - 05/26/15 1217    Visit Number 1   Number of Visits 18   Date for PT Re-Evaluation 07/25/15   Authorization Type Medicare need G codes   PT Start Time 0932   PT Stop Time 1016   PT Time Calculation (min) 44 min   Equipment Utilized During Treatment Gait belt   Activity Tolerance Patient tolerated treatment well;No increased pain   Behavior During Therapy [facility] for tasks assessed/performed      Past Medical History  Diagnosis Date  . Hyperlipidemia   . Atrial fibrillation ([place])   . Peyronie disease   . Hypertension     Past Surgical History  Procedure Laterality Date  . Knee surgery    . Tonsillectomy    . Thumb replacement      There were no vitals filed for this visit.       Subjective Assessment - 05/26/15 0938    Subjective Pt is a 75 yr old male here for an recent increase in falls over the past 2 weeks, patient reports 5 falls in those 2 weeks. Pt believe increase in falls is due to medications. He notices the falls more in narrow spaces and "walking on walls". He states he is stumbling and losing his balance in when he is thinking about something else such as carrying an object.  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 No/denies             [facility] PT Assessment - 05/26/15 0001    Assessment   Medical Diagnosis Frequent Falls   Referring Provider Harlan Stains, MD   Hand Dominance Left   Precautions   Precautions None   Restrictions   Weight Bearing Restrictions No   Balance Screen   Has the patient fallen in the past 6 months Yes   How many times? 7  Falls increased in 2 weeks   Has the patient had a decrease in activity level because of a fear of falling?  No   Is the patient reluctant to leave their home because of a fear of falling?  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   Cognition   Overall Cognitive Status Within Functional Limits for tasks assessed  Observation/Other Assessments   Focus on Therapeutic Outcomes (FOTO)  92.60 Functional Status  53 Risk Adjusted   Activities of Balance Confidence Scale (ABC Scale)  86.3%   Fear Avoidance Belief Questionnaire (FABQ)  19 (5)   Sensation   Light Touch --  hand numbness after sleeping   Functional Tests   Functional tests Other  Heel Raises 5x, less coordinated and weaker on R   Posture/Postural Control   Posture/Postural Control Postural limitations   Postural Limitations Forward head;Increased thoracic kyphosis   ROM / Strength   AROM / PROM / Strength Strength   Strength   Strength Assessment Site Hip;Knee;Ankle   Right/Left Hip Right;Left   Right Hip Extension 4+/5   Right Hip ABduction 4+/5   Left Hip Extension 4+/5    Left Hip ABduction 4+/5   Right/Left Knee Right;Left   Right Knee Flexion 5/5   Right Knee Extension 5/5   Left Knee Flexion 5/5   Left Knee Extension 5/5   Right/Left Ankle Right;Left   Right Ankle Dorsiflexion 4-/5   Right Ankle Plantar Flexion 4/5   Left Ankle Dorsiflexion 5/5   Left Ankle Plantar Flexion 5/5   Ambulation/Gait   Ambulation/Gait Yes   Ambulation/Gait Assistance 5: Supervision   Ambulation/Gait Assistance Details Pt able to ambulate with no loss of balance except with object negiotion and head turns   Ambulation Distance (Feet) 300 Feet   Assistive device None   Gait Pattern Step-through pattern;Decreased arm swing - left;Decreased arm swing - right;Trunk flexed;Wide base of support   Ambulation Surface Level;Indoor   Gait velocity 4.18 ft/s   Gait velocity - backwards --   Stairs Yes   Stairs Assistance 7: Independent   Stair Management Technique No rails;Alternating pattern;Forwards   Number of Stairs 4   Height of Stairs 6   Standardized Balance Assessment   Standardized Balance Assessment Berg Balance Test   Berg Balance Test   Sit to Stand Able to stand without using hands and stabilize independently   Standing Unsupported Able to stand safely 2 minutes   Sitting with Back Unsupported but Feet Supported on Floor or Stool Able to sit safely and securely 2 minutes   Stand to Sit Sits safely with minimal use of hands   Transfers Able to transfer safely, minor use of hands   Standing Unsupported with Eyes Closed Able to stand 10 seconds safely   Standing Ubsupported with Feet Together Able to place feet together independently and stand 1 minute safely   From Standing, Reach Forward with Outstretched Arm Can reach confidently >25 cm (10")   From Standing Position, Pick up Object from Floor Able to pick up shoe safely and easily   From Standing Position, Turn to Look Behind Over each Shoulder Looks behind from both sides and weight shifts well   Turn 360  Degrees Able to turn 360 degrees safely but slowly   Standing Unsupported, Alternately Place Feet on Step/Stool Able to complete >2 steps/needs minimal assist   Standing Unsupported, One Foot in Front Able to take small step independently and hold 30 seconds   Standing on One Leg Able to lift leg independently and hold 5-10 seconds   Total Score 48   Functional Gait  Assessment   Gait assessed  Yes   Gait Level Surface Walks 20 ft in less than 5.5 sec, no assistive devices, good speed, no evidence for imbalance, normal gait pattern, deviates no more than 6 in outside of the 12 in walkway  width.   Change in Gait Speed Makes only minor adjustments to walking speed, or accomplishes a change in speed with significant gait deviations, deviates 10-15 in outside the 12 in walkway width, or changes speed but loses balance but is able to recover and continue walking.   Gait with Horizontal Head Turns Performs head turns smoothly with slight change in gait velocity (eg, minor disruption to smooth gait path), deviates 6-10 in outside 12 in walkway width, or uses an assistive device.   Gait with Vertical Head Turns Performs task with slight change in gait velocity (eg, minor disruption to smooth gait path), deviates 6 - 10 in outside 12 in walkway width or uses assistive device   Gait and Pivot Turn Pivot turns safely in greater than 3 sec and stops with no loss of balance, or pivot turns safely within 3 sec and stops with mild imbalance, requires small steps to catch balance.   Step Over Obstacle Is able to step over one shoe box (4.5 in total height) without changing gait speed. No evidence of imbalance.   Gait with Narrow Base of Support Ambulates less than 4 steps heel to toe or cannot perform without assistance.   Gait with Eyes Closed Walks 20 ft, slow speed, abnormal gait pattern, evidence for imbalance, deviates 10-15 in outside 12 in walkway width. Requires more than 9 sec to ambulate 20 ft.   Ambulating  Backwards Walks 20 ft, slow speed, abnormal gait pattern, evidence for imbalance, deviates 10-15 in outside 12 in walkway width.   Steps Alternating feet, no rail.   Total Score 17                           PT Education - 05/26/15 1216    Education provided Yes   Education Details Physical therapy services and remaining active    Person(s) Educated Patient   Methods Explanation   Comprehension Verbalized understanding          PT Short Term Goals - 05/26/15 1500    PT SHORT TERM GOAL #1   Title Patient is independent with initial HEP. (Target Date: 06/25/2015)   Time 1   Period Months   Status New   PT SHORT TERM GOAL #2   Title Patient ambulates and scans environment while maintaining path & pace. (Target Date: 06/25/2015)   Time 1   Period Months   Status New   PT SHORT TERM GOAL #3   Title Patient able to stand tandem for >30 seconds safely to indicate improvement in balance. (Target Date: 06/25/2015)   Time 1   Period Months   Status New           PT Long Term Goals - 05/26/15 1321    PT LONG TERM GOAL #1   Title Pt will be able to ambulate 1000' outside surfaces independently (Target Date: 07/25/2015)   Time 2   Period Months   Status New   PT LONG TERM GOAL #2   Title Pt will improve FGA to 30/30 to enable community ambulation and functional gait (Target Date: 07/25/2015)   Baseline --   Time 2   Period Months   Status New   PT LONG TERM GOAL #3   Title Pt will improve Berg Balance Score >/= 5456 to indicate decrease in fall risk (Target Date: 07/25/2015)   Baseline --   Time 2   Period Months   Status New   PT  LONG TERM GOAL #4   Title Patient is independent in ongoing HEP / fitness plan. (Target Date: 07/25/2015)   Baseline --   Time 2   Period Months   Status New               Plan - 06/01/15 1218    Clinical Impression Statement Pt is a 75 yr old male here for frequent falls onset 2 weeks ago but has stated he feels he  has gotten clumsier over the lat few years. Patients Berg Balance Score= 48/56 and  FGA 19/30 which is predictive for future falls in community dwelling adults, he has increased diffculty with dynamic balance activies and dual tasking, such as stepping over objects and head turns with gait. Strength weakness noted in right hip, R dorsiflexion and plantarflexion and patient reports sensory changes to right hand. These deficits have limited his ability to function within the home and the community. Skilled PT is needed to address R sided fucnitonal weakness and balance deficits in order to increased mobility in the community and home.    Rehab Potential Good   Clinical Impairments Affecting Rehab Potential Pt is active and wants to get better   PT Frequency 2x / week   PT Duration 4 weeks   PT Treatment/Interventions ADLs/Self Care Home Management;Therapeutic exercise;Therapeutic activities;Functional mobility training;Stair training;Gait training;DME Instruction;Balance training;Neuromuscular re-education;Patient/family education;Orthotic Fit/Training;Manual techniques   PT Next Visit Plan orthostatic hypotension test, dynamic high level balance activities, unlevel terrian   Consulted and Agree with Plan of Care Patient      Patient will benefit from skilled therapeutic intervention in order to improve the following deficits and impairments:  Decreased balance, Decreased coordination, Decreased mobility, Decreased strength, Postural dysfunction, Abnormal gait  Visit Diagnosis: Other abnormalities of gait and mobility  Unsteadiness on feet  Muscle weakness (generalized)      G-Codes - 06-01-15 1500    Functional Assessment Tool Used Berg Balance 48/56 & Functional Gait Assessment 19/30   Functional Limitation Mobility: Walking and moving around   Mobility: Walking and Moving Around Current Status [phone]) At least 20 percent but less than 40 percent impaired, limited or restricted   Mobility:  Walking and Moving Around Goal Status [phone]) At least 1 percent but less than 20 percent impaired, limited or restricted       Problem List Patient Active Problem List   Diagnosis Date Noted  . Atrial fibrillation ([place]) 06/11/2014   Dillard Essex, SPT 06/01/15, 1:34 PM  Initial Evaluation completed. Please sign and either place in EPIC or FAX back to [place], PT, DPT PT Specializing in [place] Phone:  [phone]  Fax:  [phone] [place], [place] [address]   [facility] [address] [place], [place], [address] Phone: [phone]   Fax:  [phone]  Name: Jose Fuentes MRN: [id number] Date of Birth: 04/21/1940

## 2015-05-28 ENCOUNTER — Encounter: Payer: Self-pay | Admitting: Physical Therapy

## 2015-05-28 ENCOUNTER — Ambulatory Visit: Payer: Medicare Other | Admitting: Physical Therapy

## 2015-05-28 DIAGNOSIS — M6281 Muscle weakness (generalized): Secondary | ICD-10-CM

## 2015-05-28 DIAGNOSIS — R2681 Unsteadiness on feet: Secondary | ICD-10-CM

## 2015-05-28 DIAGNOSIS — R2689 Other abnormalities of gait and mobility: Secondary | ICD-10-CM | POA: Diagnosis not present

## 2015-05-28 NOTE — Therapy (Signed)
[facility] [address] [place], [place], [address] Phone: [phone]   Fax:  [phone]  Physical Therapy Treatment  Patient Details  Name: Jose Fuentes MRN: [id number] Date of Birth: August 07, 1940 Referring Provider: Harlan Stains, MD  Encounter Date: 05/28/2015      PT End of Session - 05/28/15 1012    Visit Number 2   Number of Visits 18   Date for PT Re-Evaluation 07/25/15   Authorization Type Medicare need G codes   PT Start Time 0932   PT Stop Time 1010   PT Time Calculation (min) 38 min   Activity Tolerance Patient tolerated treatment well;No increased pain      Past Medical History  Diagnosis Date  . Hyperlipidemia   . Atrial fibrillation ([place])   . Peyronie disease   . Hypertension     Past Surgical History  Procedure Laterality Date  . Knee surgery    . Tonsillectomy    . Thumb replacement      There were no vitals filed for this visit.      Subjective Assessment - 05/28/15 0934    Subjective No falls since last visit.   Pertinent History A-fib, Dyslipedmia, HTN, depressoin, thrombocyopenia, hypercholestrolemia   Limitations Walking;House hold activities   Patient Stated Goals Improvement in balance wants to be able to get back to woodoworking   Currently in Pain? No/denies                              Balance Exercises - 05/28/15 0956    Balance Exercises: Standing   Standing Eyes Opened/Closed Narrow base of support (BOS);Foam/compliant surface  Performed exercises on HEP handout below see for detail. Performed at a Supervision level in intermittant UE support. Cues for technique.   SLS Eyes open  amb forward then sideways tapping cones progressing wit Min guard   Tandem Gait Forward  Supervision level, self corrected imbalance           PT Education - 05/28/15 0957    Education Details HEP for standing on compliant surface   Person(s) Educated  Patient   Methods Explanation;Demonstration;Verbal cues;Handout   Comprehension Verbalized understanding;Returned demonstration;Verbal cues required;Need further instruction          PT Short Term Goals - 05/26/15 1500    PT SHORT TERM GOAL #1   Title Patient is independent with initial HEP. (Target Date: 06/25/2015)   Time 1   Period Months   Status New   PT SHORT TERM GOAL #2   Title Patient ambulates and scans environment while maintaining path & pace. (Target Date: 06/25/2015)   Time 1   Period Months   Status New   PT SHORT TERM GOAL #3   Title Patient able to stand tandem for >30 seconds safely to indicate improvement in balance. (Target Date: 06/25/2015)   Time 1   Period Months   Status New           PT Long Term Goals - 05/26/15 1321    PT LONG TERM GOAL #1   Title Pt will be able to ambulate 1000' outside surfaces independently (Target Date: 07/25/2015)   Time 2   Period Months   Status New   PT LONG TERM GOAL #2   Title Pt will improve FGA to 30/30 to enable community ambulation and functional gait (Target Date: 07/25/2015)   Baseline --   Time 2  Period Months   Status New   PT LONG TERM GOAL #3   Title Pt will improve Berg Balance Score >/= 5456 to indicate decrease in fall risk (Target Date: 07/25/2015)   Baseline --   Time 2   Period Months   Status New   PT LONG TERM GOAL #4   Title Patient is independent in ongoing HEP / fitness plan. (Target Date: 07/25/2015)   Baseline --   Time 2   Period Months   Status New               Plan - 05/28/15 1013    Clinical Impression Statement Skilled session focused on initiating HEP for standing balance on compliant surface and worked on balance with narrow BOS and SLS.  Pt requires intermittant UEsupport and supervision to [place] with balance activities today.   Rehab Potential Good   Clinical Impairments Affecting Rehab Potential Pt is active and wants to get better   PT Frequency 2x / week   PT  Duration 4 weeks   PT Treatment/Interventions ADLs/Self Care Home Management;Therapeutic exercise;Therapeutic activities;Functional mobility training;Stair training;Gait training;DME Instruction;Balance training;Neuromuscular re-education;Patient/family education;Orthotic Fit/Training;Manual techniques   PT Next Visit Plan orthostatic hypotension test, Review HEP dynamic high level balance activities, unlevel terrian   Consulted and Agree with Plan of Care Patient      Patient will benefit from skilled therapeutic intervention in order to improve the following deficits and impairments:  Decreased balance, Decreased coordination, Decreased mobility, Decreased strength, Postural dysfunction, Abnormal gait  Visit Diagnosis: Other abnormalities of gait and mobility  Unsteadiness on feet  Muscle weakness (generalized)     Problem List Patient Active Problem List   Diagnosis Date Noted  . Atrial fibrillation ([place]) 06/11/2014    Jose Fuentes, Jose Fuentes  05/28/2015, 10:34 AM [facility] [address] [place], [place], [address] Phone: [phone]   Fax:  [phone]  Name: Jose Fuentes MRN: [id number] Date of Birth: 24-Jun-1940

## 2015-05-28 NOTE — Patient Instructions (Signed)
  Feet Partial Heel-Toe (Compliant Surface) Head Motion - Eyes Open    With eyes open, standing on compliant surface: ____pillow____, right foot partially in front of the other then switch foot in front, move head slowly: up and down, side-to-side and diagonal Repeat __5-10__ times per session. Do _1___ sessions per day.  Copyright  VHI. All rights reserved.  Feet Together (Compliant Surface) Head Motion - Eyes Closed    Stand on compliant surface: ___pillow_____ with feet together. Close eyes and move head slowly, up and down, side to side Repeat _5-10___ times per session. Do __1__ sessions per day.  Copyright  VHI. All rights reserved.

## 2015-06-03 ENCOUNTER — Encounter: Payer: Self-pay | Admitting: Physical Therapy

## 2015-06-03 ENCOUNTER — Ambulatory Visit: Payer: Medicare Other | Admitting: Physical Therapy

## 2015-06-03 DIAGNOSIS — R2689 Other abnormalities of gait and mobility: Secondary | ICD-10-CM | POA: Diagnosis not present

## 2015-06-03 DIAGNOSIS — M6281 Muscle weakness (generalized): Secondary | ICD-10-CM

## 2015-06-03 DIAGNOSIS — R2681 Unsteadiness on feet: Secondary | ICD-10-CM | POA: Diagnosis not present

## 2015-06-03 NOTE — Patient Instructions (Addendum)
Orthostatic Hypotension Orthostatic hypotension is a sudden drop in blood pressure. It happens when you quickly stand up from a seated or lying position. You may feel dizzy or light-headed. This can last for just a few seconds or for up to a few minutes. It is usually not a serious problem. However, if this happens frequently or gets worse, it can be a sign of something more serious. CAUSES  Different things can cause orthostatic hypotension, including:   Loss of body fluids (dehydration).  Medicines that lower blood pressure.  Sudden changes in posture, such as standing up quickly after you have been sitting or lying down.  Taking too much of your medicine. SIGNS AND SYMPTOMS   Light-headedness or dizziness.   Fainting or near-fainting.   A fast heart rate.   Weakness.   Feeling tired (fatigue).  DIAGNOSIS  Your health care provider may do several things to help diagnose your condition and identify the cause. These may include:   Taking a medical history and doing a physical exam.  Checking your blood pressure. Your health care provider will check your blood pressure when you are:  Lying down.  Sitting.  Standing.  Using tilt table testing. In this test, you lie down on a table that moves from a lying position to a standing position. You will be strapped onto the table. This test monitors your blood pressure and heart rate when you are in different positions. TREATMENT  Treatment will vary depending on the cause. Possible treatments include:   Changing the dosage of your medicines.  Wearing compression stockings on your lower legs.  Standing up slowly after sitting or lying down.  Eating more salt.  Eating frequent, small meals.  In some cases, getting IV fluids.  Taking medicine to enhance fluid retention. HOME CARE INSTRUCTIONS  Only take over-the-counter or prescription medicines as directed by your health care provider.  Follow your health care  provider's instructions for changing the dosage of your current medicines.  Do not stop or adjust your medicine on your own.  Stand up slowly after sitting or lying down. This allows your body to adjust to the different position.  Wear compression stockings as directed.  Eat extra salt as directed.  Do not add extra salt to your diet unless directed to by your health care provider.  Eat frequent, small meals.  Avoid standing suddenly after eating.  Avoid hot showers or excessive heat as directed by your health care provider.  Keep all follow-up appointments. SEEK MEDICAL CARE IF:  You continue to feel dizzy or light-headed after standing.  You feel groggy or confused.  You feel cold, clammy, or sick to your stomach (nauseous).  You have blurred vision.  You feel short of breath. SEEK IMMEDIATE MEDICAL CARE IF:   You faint after standing.  You have chest pain.  You have difficulty breathing.   You lose feeling or movement in your arms or legs.   You have slurred speech or difficulty talking, or you are unable to talk.  MAKE SURE YOU:   Understand these instructions.  Will watch your condition.  Will get help right away if you are not doing well or get worse.   This information is not intended to replace advice given to you by your health care provider. Make sure you discuss any questions you have with your health care provider.   Document Released: 12/11/2001 Document Revised: 12/26/2012 Document Reviewed: 10/13/2012 Elsevier Interactive Patient Education 2016 [employer].   "I  love a Database administrator    At counter top: high knee marching forward and then backwards. Hold each knee up for 3-5 seconds with marching. Perform 3 laps each way with UE support on counter top as needed for balance. Perform 1 time a day. http://gt2.exer.us/344   Copyright  VHI. All rights reserved.   Walking on Heels    Walk on heels forward and then backwards using  counter top as needed for balance. Perform 3 laps each way. Do _1 sessions per day.  Copyright  VHI. All rights reserved.   Carpeted Surface With Side to Side Head Motion    Perform without assistive device. Walking along a long, straight pathway (such as hallway), turn head and eyes left for 3 steps. Then, turn head and eyes to opposite side for 3 steps. Repeat sequence _4___ times per session. Do __1__ sessions per day. Repeat in dimly lit room. Copyright  VHI. All rights reserved.

## 2015-06-03 NOTE — Therapy (Signed)
[facility] [address] [place], [place], [address] Phone: [phone]   Fax:  [phone]  Physical Therapy Treatment  Patient Details  Name: Jose Fuentes MRN: [id number] Date of Birth: [birth year] Referring Provider: Harlan Stains, MD  Encounter Date: 06/03/2015      PT End of Session - 06/03/15 0852    Visit Number 3   Number of Visits 18   Date for PT Re-Evaluation 07/25/15   Authorization Type Medicare need G codes   PT Start Time 0849   PT Stop Time 0930   PT Time Calculation (min) 41 min   Equipment Utilized During Treatment Gait belt   Activity Tolerance Patient tolerated treatment well;No increased pain   Behavior During Therapy [facility] for tasks assessed/performed      Past Medical History  Diagnosis Date  . Hyperlipidemia   . Atrial fibrillation ([place])   . Peyronie disease   . Hypertension     Past Surgical History  Procedure Laterality Date  . Knee surgery    . Tonsillectomy    . Thumb replacement      There were no vitals filed for this visit.      Subjective Assessment - 06/03/15 0851    Subjective No new complaints. No falls or pain to report. HEP going well, still challenging.   Pertinent History A-fib, Dyslipedmia, HTN, depressoin, thrombocyopenia, hypercholestrolemia   Limitations Walking;House hold activities   Patient Stated Goals Improvement in balance wants to be able to get back to woodoworking   Currently in Pain? No/denies                Vestibular Assessment - 06/03/15 0855    Orthostatics   BP supine (x 5 minutes) 152/84 mmHg   HR supine (x 5 minutes) 80   BP standing (after 1 minute) 120/68 mmHg   HR standing (after 1 minute) 85   BP standing (after 3 minutes) 130/76 mmHg   HR standing (after 3 minutes) 83   Orthostatics Comment Reported slight dizziness with intitial standing. Resolved within seconds of static standing.           [facility] Adult PT  Treatment/Exercise - 06/03/15 0914    Ambulation/Gait   Gait Comments in long hallway: forward gait with head movements up<>down and left<>right x 3 each way with no AD/UE support, min guard assistance needed.   High Level Balance   High Level Balance Activities Marching forwards;Marching backwards;Head turns  toe/heel walking fwd/bwd;head turns fwd only   High Level Balance Comments at counter top: high knee marching fwd/bwd, toe walking/heel walking fwd/bwd x 3 laps each/each way with no to 1 UE support and min guard assistance.            PT Education - 06/03/15 [phone]    Education provided Yes   Education Details information on orthostatic hypotension;dynamic gait/balance HEP   Person(s) Educated Patient   Methods Explanation;Demonstration;Handout;Verbal cues   Comprehension Verbalized understanding;Returned demonstration;Verbal cues required;Need further instruction          PT Short Term Goals - 05/26/15 1500    PT SHORT TERM GOAL #1   Title Patient is independent with initial HEP. (Target Date: 06/25/2015)   Time 1   Period Months   Status New   PT SHORT TERM GOAL #2   Title Patient ambulates and scans environment while maintaining path & pace. (Target Date: 06/25/2015)   Time 1   Period Months   Status New  PT SHORT TERM GOAL #3   Title Patient able to stand tandem for >30 seconds safely to indicate improvement in balance. (Target Date: 06/25/2015)   Time 1   Period Months   Status New           PT Long Term Goals - 05/26/15 1321    PT LONG TERM GOAL #1   Title Pt will be able to ambulate 1000' outside surfaces independently (Target Date: 07/25/2015)   Time 2   Period Months   Status New   PT LONG TERM GOAL #2   Title Pt will improve FGA to 30/30 to enable community ambulation and functional gait (Target Date: 07/25/2015)   Baseline --   Time 2   Period Months   Status New   PT LONG TERM GOAL #3   Title Pt will improve Berg Balance Score >/= 5456 to  indicate decrease in fall risk (Target Date: 07/25/2015)   Baseline --   Time 2   Period Months   Status New   PT LONG TERM GOAL #4   Title Patient is independent in ongoing HEP / fitness plan. (Target Date: 07/25/2015)   Baseline --   Time 2   Period Months   Status New            Plan - 06/03/15 [id number]    Clinical Impression Statement Today's session addressed orthostatic hypotension and advaced HEP to include dynamic gait balance activites. No issues reported with session. Pt is making steady progress toward goals.    Rehab Potential Good   Clinical Impairments Affecting Rehab Potential Pt is active and wants to get better   PT Frequency 2x / week   PT Duration 4 weeks   PT Treatment/Interventions ADLs/Self Care Home Management;Therapeutic exercise;Therapeutic activities;Functional mobility training;Stair training;Gait training;DME Instruction;Balance training;Neuromuscular re-education;Patient/family education;Orthotic Fit/Training;Manual techniques   PT Next Visit Plan dynamic high level balance activities, unlevel terrian   Consulted and Agree with Plan of Care Patient      Patient will benefit from skilled therapeutic intervention in order to improve the following deficits and impairments:  Decreased balance, Decreased coordination, Decreased mobility, Decreased strength, Postural dysfunction, Abnormal gait  Visit Diagnosis: Other abnormalities of gait and mobility  Unsteadiness on feet  Muscle weakness (generalized)     Problem List Patient Active Problem List   Diagnosis Date Noted  . Atrial fibrillation ([place]) 06/11/2014    Willow Ora, PTA, [facility] [address] [place], [place] [address] [phone] 06/03/2015, 4:02 PM   Name: Jose Fuentes MRN: [id number] Date of Birth: 1940/12/19

## 2015-06-05 ENCOUNTER — Ambulatory Visit: Payer: Medicare Other | Attending: Family Medicine | Admitting: Physical Therapy

## 2015-06-05 ENCOUNTER — Encounter: Payer: Self-pay | Admitting: Physical Therapy

## 2015-06-05 DIAGNOSIS — R2681 Unsteadiness on feet: Secondary | ICD-10-CM | POA: Diagnosis not present

## 2015-06-05 DIAGNOSIS — R2689 Other abnormalities of gait and mobility: Secondary | ICD-10-CM | POA: Insufficient documentation

## 2015-06-05 DIAGNOSIS — M6281 Muscle weakness (generalized): Secondary | ICD-10-CM | POA: Insufficient documentation

## 2015-06-05 NOTE — Therapy (Signed)
[facility] [address] [place], [place], [address] Phone: [phone]   Fax:  [phone]  Physical Therapy Treatment  Patient Details  Name: Jose Fuentes MRN: [id number] Date of Birth: 02-10-1940 Referring Provider: Harlan Stains, MD  Encounter Date: 06/05/2015      PT End of Session - 06/05/15 0809    Visit Number 4   Number of Visits 18   Date for PT Re-Evaluation 07/25/15   Authorization Type Medicare need G codes   PT Start Time 0806   PT Stop Time 0845   PT Time Calculation (min) 39 min   Equipment Utilized During Treatment Gait belt   Activity Tolerance Patient tolerated treatment well;No increased pain   Behavior During Therapy [facility] for tasks assessed/performed      Past Medical History  Diagnosis Date  . Hyperlipidemia   . Atrial fibrillation ([place])   . Peyronie disease   . Hypertension     Past Surgical History  Procedure Laterality Date  . Knee surgery    . Tonsillectomy    . Thumb replacement      There were no vitals filed for this visit.      Subjective Assessment - 06/05/15 0808    Subjective No new complaints. No falls or pain to report. HEP going well, gait with head turns most challenging.   Pertinent History A-fib, Dyslipedmia, HTN, depressoin, thrombocyopenia, hypercholestrolemia   Limitations Walking;House hold activities   Patient Stated Goals Improvement in balance wants to be able to get back to woodoworking           [facility] Adult PT Treatment/Exercise - 06/05/15 0810    High Level Balance   High Level Balance Activities Marching forwards;Marching backwards;Tandem walking  tandem fwd/bwd, heel/toe walk fwd/bwd   High Level Balance Comments on red mats x 3 laps each with min guard to min assist, no UE support;   Neuro Re-ed    Neuro Re-ed Details  blue mat over ramp: performed both ways on ramp with feet apart progressing to feet together: EC no head movements, EC  with head movements up<>down, left<>right and diagonals both ways, up to min assist for balance; Facing up ramp: alternating stepping forward and then back to start stance position, emphasis on weight shifting onto forward leg and then back with stepping x 10 each with min assist for balance at times, then while facing down the ramp alternating stepping backward and then returning to start position with emphasis again on weight shifting each time, x 10 each side with up to min assist needed for balance.               Balance Exercises - 06/05/15 0817    Balance Exercises: Standing   SLS with Vectors Foam/compliant surface;Other reps (comment);Limitations   Balance Beam blue foam balance beam- pt standing with feet across beam in normal width stance: alternating forward heel taps x 10 each side, alternating backwards toe taps x 10 each with no UE support and up to min assist for balance. cues on posture and weight shifting. same position- alternating large foward stepping to floor and then back onto foam x 10 reps each, alternating backwards large stepping to floor and then back onto foam x 10 reps each leg, cues on posture, weight shifitng with up to min assist needed.                      Balance Exercises: Standing  SLS with Vectors Limitations 6 cones along edge of mat: alternating toe taps toe each one with side stepping left<>right x 1 lap each way;alternating double toe tapping to each one with side stepping left<>right x 1 lap each way; 5 cones down center of mat: forward toe tap to each cone with weaving around them, alternating sides each time, x 4 laps forward. up to min assist needed for balance.                                   PT Short Term Goals - 05/26/15 1500    PT SHORT TERM GOAL #1   Title Patient is independent with initial HEP. (Target Date: 06/25/2015)   Time 1   Period Months   Status New   PT SHORT TERM GOAL #2   Title Patient ambulates and scans environment  while maintaining path & pace. (Target Date: 06/25/2015)   Time 1   Period Months   Status New   PT SHORT TERM GOAL #3   Title Patient able to stand tandem for >30 seconds safely to indicate improvement in balance. (Target Date: 06/25/2015)   Time 1   Period Months   Status New           PT Long Term Goals - 05/26/15 1321    PT LONG TERM GOAL #1   Title Pt will be able to ambulate 1000' outside surfaces independently (Target Date: 07/25/2015)   Time 2   Period Months   Status New   PT LONG TERM GOAL #2   Title Pt will improve FGA to 30/30 to enable community ambulation and functional gait (Target Date: 07/25/2015)   Baseline --   Time 2   Period Months   Status New   PT LONG TERM GOAL #3   Title Pt will improve Berg Balance Score >/= 5456 to indicate decrease in fall risk (Target Date: 07/25/2015)   Baseline --   Time 2   Period Months   Status New   PT LONG TERM GOAL #4   Title Patient is independent in ongoing HEP / fitness plan. (Target Date: 07/25/2015)   Baseline --   Time 2   Period Months   Status New            Plan - 06/05/15 0809    Clinical Impression Statement Today's session continued to focus on high level balance activities without any issues reported. Pt challenged with complaint surfaces and with single leg stance/coordination activiites. Pt is making steady progress toward goals.    Rehab Potential Good   Clinical Impairments Affecting Rehab Potential Pt is active and wants to get better   PT Frequency 2x / week   PT Duration 4 weeks   PT Treatment/Interventions ADLs/Self Care Home Management;Therapeutic exercise;Therapeutic activities;Functional mobility training;Stair training;Gait training;DME Instruction;Balance training;Neuromuscular re-education;Patient/family education;Orthotic Fit/Training;Manual techniques   PT Next Visit Plan dynamic high level balance activities, unlevel terrian   Consulted and Agree with Plan of Care Patient       Patient will benefit from skilled therapeutic intervention in order to improve the following deficits and impairments:  Decreased balance, Decreased coordination, Decreased mobility, Decreased strength, Postural dysfunction, Abnormal gait  Visit Diagnosis: Other abnormalities of gait and mobility  Unsteadiness on feet  Muscle weakness (generalized)     Problem List Patient Active Problem List   Diagnosis Date Noted  . Atrial fibrillation ([place]) 06/11/2014  Willow Ora, PTA, [facility] [address], [place], [place] [address] [phone] 06/05/2015, 12:07 PM   Name: Devrin Athey MRN: [id number] Date of Birth: 1940-06-22

## 2015-06-09 ENCOUNTER — Ambulatory Visit: Payer: Medicare Other | Admitting: Physical Therapy

## 2015-06-09 DIAGNOSIS — R2681 Unsteadiness on feet: Secondary | ICD-10-CM

## 2015-06-09 DIAGNOSIS — R2689 Other abnormalities of gait and mobility: Secondary | ICD-10-CM | POA: Diagnosis not present

## 2015-06-09 DIAGNOSIS — M6281 Muscle weakness (generalized): Secondary | ICD-10-CM | POA: Diagnosis not present

## 2015-06-09 NOTE — Therapy (Signed)
[facility] [address] [place], [place], [address] Phone: [phone]   Fax:  [phone]  Physical Therapy Treatment  Patient Details  Name: Jose Fuentes MRN: [id number] Date of Birth: [birth year] Referring Provider: Harlan Stains, MD  Encounter Date: 06/09/2015      PT End of Session - 06/09/15 1712    Visit Number 5   Number of Visits 18   Date for PT Re-Evaluation 07/25/15   Authorization Type Medicare need G codes   PT Start Time 1230-04-12   PT Stop Time 1316   PT Time Calculation (min) 44 min   Equipment Utilized During Treatment Gait belt   Activity Tolerance Patient tolerated treatment well;No increased pain   Behavior During Therapy [facility] for tasks assessed/performed      Past Medical History  Diagnosis Date  . Hyperlipidemia   . Atrial fibrillation ([place])   . Peyronie disease   . Hypertension     Past Surgical History  Procedure Laterality Date  . Knee surgery    . Tonsillectomy    . Thumb replacement      There were no vitals filed for this visit.      Subjective Assessment - 06/09/15 1236    Subjective No falls, got dizzy this morning from sit to stand. HEP is getting easy except for marching backwards andwalking with head turns    Pertinent History A-fib, Dyslipedmia, HTN, depressoin, thrombocyopenia, hypercholestrolemia   Limitations Walking;House hold activities   Patient Stated Goals Improvement in balance wants to be able to get back to woodoworking   Currently in Pain? No/denies                         [facility] Adult PT Treatment/Exercise - 06/09/15 1300    High Level Balance   High Level Balance Activities --  tandem fwd/bwd, heel/toe walk fwd/bwd   High Level Balance Comments --   Neuro Re-ed    Neuro Re-ed Details  Weaving in and out of cones while kicking playground ball 2x             Balance Exercises - 06/10/15 1152    Balance Exercises: Standing   Rebounder Foam/compliant surface;15 reps  foam balance beam   Rockerboard Anterior/posterior;Lateral;Head turns  reaching lateral, overhead, across midline   Balance Beam foam balance beam horizontal, vertical, diagonal head turns 15x each direction, single limb stance on foam balance beam with anterior/posterior  and clockwise rotation on playground balll.    Balance Exercises: Standing   SLS with Vectors Limitations 6 cones along edge of mat: alternating toe taps toe each one with side stepping left<>right x 1 lap each way;alternating double toe tapping to each one with side stepping left<>right x 1 lap each way; 5 cones down center of mat: forward toe tap to each cone with weaving around them, alternating sides each time, x 4 laps forward. up to min assist needed for balance.                                 PT Education - 06/09/15 1712    Education provided Yes   Education Details educated on safety with HEP   Person(s) Educated Patient   Methods Explanation   Comprehension Verbalized understanding          PT Short Term Goals - 05/26/15 1500    PT SHORT TERM GOAL #  1   Title Patient is independent with initial HEP. (Target Date: 06/25/2015)   Time 1   Period Months   Status New   PT SHORT TERM GOAL #2   Title Patient ambulates and scans environment while maintaining path & pace. (Target Date: 06/25/2015)   Time 1   Period Months   Status New   PT SHORT TERM GOAL #3   Title Patient able to stand tandem for >30 seconds safely to indicate improvement in balance. (Target Date: 06/25/2015)   Time 1   Period Months   Status New           PT Long Term Goals - 05/26/15 1321    PT LONG TERM GOAL #1   Title Pt will be able to ambulate 1000' outside surfaces independently (Target Date: 07/25/2015)   Time 2   Period Months   Status New   PT LONG TERM GOAL #2   Title Pt will improve FGA to 30/30 to enable community ambulation and functional gait (Target Date: 07/25/2015)    Baseline --   Time 2   Period Months   Status New   PT LONG TERM GOAL #3   Title Pt will improve Berg Balance Score >/= 5456 to indicate decrease in fall risk (Target Date: 07/25/2015)   Baseline --   Time 2   Period Months   Status New   PT LONG TERM GOAL #4   Title Patient is independent in ongoing HEP / fitness plan. (Target Date: 07/25/2015)   Baseline --   Time 2   Period Months   Status New               Plan - 06/09/15 1713    Clinical Impression Statement Session continued to focus on high level balance activites, he has increased diffculty on complaint surfaces with horizontal head turns. Pt is continuing to progress toward goals   Rehab Potential Good   Clinical Impairments Affecting Rehab Potential Pt is active and wants to get better   PT Frequency 2x / week   PT Duration 4 weeks   PT Treatment/Interventions ADLs/Self Care Home Management;Therapeutic exercise;Therapeutic activities;Functional mobility training;Stair training;Gait training;DME Instruction;Balance training;Neuromuscular re-education;Patient/family education;Orthotic Fit/Training;Manual techniques   PT Next Visit Plan dynamic high level balance activities, unlevel terrian   Consulted and Agree with Plan of Care Patient      Patient will benefit from skilled therapeutic intervention in order to improve the following deficits and impairments:  Decreased balance, Decreased coordination, Decreased mobility, Decreased strength, Postural dysfunction, Abnormal gait  Visit Diagnosis: Unsteadiness on feet  Other abnormalities of gait and mobility  Muscle weakness (generalized)     Problem List Patient Active Problem List   Diagnosis Date Noted  . Atrial fibrillation ([place]) 06/11/2014    Dillard Essex, SPT 06/10/2015, 11:54 AM  [facility] [address] [place], [place], [address] Phone: [phone]   Fax:  [phone]  Name:  Jose Fuentes MRN: [id number] Date of Birth: Jun 04, 1940

## 2015-06-13 ENCOUNTER — Ambulatory Visit: Payer: Medicare Other | Admitting: Physical Therapy

## 2015-06-13 ENCOUNTER — Encounter: Payer: Self-pay | Admitting: Physical Therapy

## 2015-06-13 DIAGNOSIS — R2681 Unsteadiness on feet: Secondary | ICD-10-CM

## 2015-06-13 DIAGNOSIS — R2689 Other abnormalities of gait and mobility: Secondary | ICD-10-CM

## 2015-06-13 DIAGNOSIS — M6281 Muscle weakness (generalized): Secondary | ICD-10-CM | POA: Diagnosis not present

## 2015-06-15 NOTE — Therapy (Signed)
[facility] [address] [place], [place], [address] Phone: [phone]   Fax:  [phone]  Physical Therapy Treatment  Patient Details  Name: Jose Fuentes MRN: [id number] Date of Birth: 07/10/1940 Referring Provider: Harlan Stains, MD  Encounter Date: 06/13/2015   06/13/15 0936  PT Visits / Re-Eval  Visit Number 6  Number of Visits 18  Date for PT Re-Evaluation 07/25/15  Authorization  Authorization Type Medicare need G codes  PT Time Calculation  PT Start Time 0932  PT Stop Time 1015  PT Time Calculation (min) 43 min  PT - End of Session  Equipment Utilized During Treatment Gait belt  Activity Tolerance Patient tolerated treatment well;No increased pain  Behavior During Therapy [facility] for tasks assessed/performed     Past Medical History  Diagnosis Date  . Hyperlipidemia   . Atrial fibrillation ([place])   . Peyronie disease   . Hypertension     Past Surgical History  Procedure Laterality Date  . Knee surgery    . Tonsillectomy    . Thumb replacement      There were no vitals filed for this visit.     06/13/15 0935  Symptoms/Limitations  Subjective No new complaints. No falls to report. No dizzness since last reported.   Pertinent History A-fib, Dyslipedmia, HTN, depressoin, thrombocyopenia, hypercholestrolemia  Limitations Walking;House hold activities  Patient Stated Goals Improvement in balance wants to be able to get back to woodoworking  Pain Assessment  Currently in Pain? No/denies     06/13/15 0001  High Level Balance  High Level Balance Activities Tandem walking;Marching forwards;Marching backwards;Head turns (toe walking, heel walking)  High Level Balance Comments on red mats x 3 laps each with min guard to min assist, no UE support;      06/13/15 0956  Balance Exercises: Standing  SLS with Vectors Foam/compliant surface;Other reps (comment);Limitations  Balance Beam blue foam  balance beam: standing with feet across beam alternating forward heel taps and backwards toe taps to floor and then back onto foam beam x 10 each foot with up to min assist and no UE support;standing with feet across beam performed zoom ball x 3-4 minutes.  Other Standing Exercises blue mat on ramp: pt performed the following activities facing both up/down ramp- alternating forward lunge and back to start position x 10 each foot, cues to step back to starting position and maintain balance with weight shifting; feet together EC no head movements, EC head movements up<>down and left<>right.                                Balance Exercises: Standing  SLS with Vectors Limitations 6 cones along edge of mats: alternating double toe taps to each with side stepping left<>right x 1 lap each way; tapping cones left>right>floor with each foot then side stepping left<>right x 1 lap each way; 5 cones down middle of mats: toe tapping to each cone with each foot and then stepping around cone to next cone, alternating directions x 4 laps forward.                                        PT Short Term Goals - 05/26/15 1500    PT SHORT TERM GOAL #1   Title Patient is independent with initial HEP. (Target Date: 06/25/2015)  Time 1   Period Months   Status New   PT SHORT TERM GOAL #2   Title Patient ambulates and scans environment while maintaining path & pace. (Target Date: 06/25/2015)   Time 1   Period Months   Status New   PT SHORT TERM GOAL #3   Title Patient able to stand tandem for >30 seconds safely to indicate improvement in balance. (Target Date: 06/25/2015)   Time 1   Period Months   Status New           PT Long Term Goals - 05/26/15 1321    PT LONG TERM GOAL #1   Title Pt will be able to ambulate 1000' outside surfaces independently (Target Date: 07/25/2015)   Time 2   Period Months   Status New   PT LONG TERM GOAL #2   Title Pt will improve FGA to 30/30 to enable community ambulation and  functional gait (Target Date: 07/25/2015)   Baseline --   Time 2   Period Months   Status New   PT LONG TERM GOAL #3   Title Pt will improve Berg Balance Score >/= 5456 to indicate decrease in fall risk (Target Date: 07/25/2015)   Baseline --   Time 2   Period Months   Status New   PT LONG TERM GOAL #4   Title Patient is independent in ongoing HEP / fitness plan. (Target Date: 07/25/2015)   Baseline --   Time 2   Period Months   Status New        06/13/15 0936  Plan  Clinical Impression Statement Today's session focused on high level balance activiities with no issues reported. Pt continues to make steady progress toward goals  Pt will benefit from skilled therapeutic intervention in order to improve on the following deficits Decreased balance;Decreased coordination;Decreased mobility;Decreased strength;Postural dysfunction;Abnormal gait  Rehab Potential Good  Clinical Impairments Affecting Rehab Potential Pt is active and wants to get better  PT Frequency 2x / week  PT Duration 4 weeks  PT Treatment/Interventions ADLs/Self Care Home Management;Therapeutic exercise;Therapeutic activities;Functional mobility training;Stair training;Gait training;DME Instruction;Balance training;Neuromuscular re-education;Patient/family education;Orthotic Fit/Training;Manual techniques  PT Next Visit Plan dynamic high level balance activities, unlevel terrian  Consulted and Agree with Plan of Care Patient       Patient will benefit from skilled therapeutic intervention in order to improve the following deficits and impairments:  Decreased balance, Decreased coordination, Decreased mobility, Decreased strength, Postural dysfunction, Abnormal gait  Visit Diagnosis: Unsteadiness on feet  Other abnormalities of gait and mobility  Muscle weakness (generalized)     Problem List Patient Active Problem List   Diagnosis Date Noted  . Atrial fibrillation ([place]) 06/11/2014    Willow Ora, PTA,  [facility] [address], [place], [place] [address] [phone] 06/15/2015, 5:40 PM   Name: Jose Fuentes MRN: [id number] Date of Birth: Apr 21, 1940

## 2015-06-16 ENCOUNTER — Encounter: Payer: Self-pay | Admitting: Physical Therapy

## 2015-06-16 ENCOUNTER — Ambulatory Visit: Payer: Medicare Other | Admitting: Physical Therapy

## 2015-06-16 DIAGNOSIS — R2689 Other abnormalities of gait and mobility: Secondary | ICD-10-CM | POA: Diagnosis not present

## 2015-06-16 DIAGNOSIS — M6281 Muscle weakness (generalized): Secondary | ICD-10-CM

## 2015-06-16 DIAGNOSIS — R2681 Unsteadiness on feet: Secondary | ICD-10-CM | POA: Diagnosis not present

## 2015-06-16 NOTE — Therapy (Signed)
[facility] [address] [place], [place], [address] Phone: [phone]   Fax:  [phone]  Physical Therapy Treatment  Patient Details  Name: Jose Fuentes MRN: [id number] Date of Birth: 21-Mar-1940 Referring Provider: Harlan Stains, MD  Encounter Date: 06/16/2015      PT End of Session - 06/16/15 0937    Visit Number 7   Number of Visits 18   Date for PT Re-Evaluation 07/25/15   Authorization Type Medicare need G codes   PT Start Time 0932   PT Stop Time 1015   PT Time Calculation (min) 43 min   Equipment Utilized During Treatment Gait belt   Activity Tolerance Patient tolerated treatment well;No increased pain   Behavior During Therapy [facility] for tasks assessed/performed      Past Medical History  Diagnosis Date  . Hyperlipidemia   . Atrial fibrillation ([place])   . Peyronie disease   . Hypertension     Past Surgical History  Procedure Laterality Date  . Knee surgery    . Tonsillectomy    . Thumb replacement      There were no vitals filed for this visit.      Subjective Assessment - 06/16/15 0937    Subjective No new complaints. No falls to report. No dizzness since last reported.    Pertinent History A-fib, Dyslipedmia, HTN, depressoin, thrombocyopenia, hypercholestrolemia   Limitations Walking;House hold activities   Patient Stated Goals Improvement in balance wants to be able to get back to woodoworking   Currently in Pain? No/denies            [facility] Adult PT Treatment/Exercise - 06/16/15 0938    Ambulation/Gait   Ambulation/Gait Yes   Ambulation/Gait Assistance 5: Supervision;6: Modified independent (Device/Increase time)   Ambulation Distance (Feet) 1000 Feet  or more   Assistive device None   Ambulation Surface Level;Unlevel;Indoor;Outdoor;Paved;Gravel;Grass   High Level Balance   High Level Balance Activities Negotiating over obstacles;Tandem walking;Marching forwards;Marching  backwards  toe and heel walking   High Level Balance Comments red/blue mat combined: stepping over hurdles of varied heights x 4 laps; high knee marching fwd/bed, toe walking fwd/bwd, heel walking fwd/bwd and tandem walking fwd/bwd x 3 laps each with up to min assist for balance.                                             Balance Exercises - 06/16/15 1007    Balance Exercises: Standing   Standing Eyes Opened Foam/compliant surface;Limitations   Standing Eyes Closed Foam/compliant surface;Head turns;Other reps (comment);Limitations   Rockerboard Anterior/posterior;Lateral;10 reps   Other Standing Exercises on grassy hill outside: high knee marching fwd down hill/bwd up hill x 3 laps, tandem walking fwd down hill/bwd up hill x 3 laps. min guard to min assist for balance.   Balance Exercises: Standing   Standing Eyes Opened Limitations on inverted BOSU: EO rocking fwd/bwd and laterally with emphasis on tall posture   Standing Eyes Closed Limitations on inverted BOSU: EC head movements up<>down, left<>right, and diagonals both ways with up to min assist for balance and no UE support.   Rebounder Limitations sit<>stands with feet on balance board, performed both ways on board x 10 reps without UE support, cues on form, weight shifting and to come up into full upright posture.  PT Short Term Goals - 05/26/15 1500    PT SHORT TERM GOAL #1   Title Patient is independent with initial HEP. (Target Date: 06/25/2015)   Time 1   Period Months   Status New   PT SHORT TERM GOAL #2   Title Patient ambulates and scans environment while maintaining path & pace. (Target Date: 06/25/2015)   Time 1   Period Months   Status New   PT SHORT TERM GOAL #3   Title Patient able to stand tandem for >30 seconds safely to indicate improvement in balance. (Target Date: 06/25/2015)   Time 1   Period Months   Status New           PT Long Term Goals - 05/26/15 1321    PT  LONG TERM GOAL #1   Title Pt will be able to ambulate 1000' outside surfaces independently (Target Date: 07/25/2015)   Time 2   Period Months   Status New   PT LONG TERM GOAL #2   Title Pt will improve FGA to 30/30 to enable community ambulation and functional gait (Target Date: 07/25/2015)   Baseline --   Time 2   Period Months   Status New   PT LONG TERM GOAL #3   Title Pt will improve Berg Balance Score >/= 5456 to indicate decrease in fall risk (Target Date: 07/25/2015)   Baseline --   Time 2   Period Months   Status New   PT LONG TERM GOAL #4   Title Patient is independent in ongoing HEP / fitness plan. (Target Date: 07/25/2015)   Baseline --   Time 2   Period Months   Status New           Plan - 06/16/15 0937    Clinical Impression Statement Continued to work on high level balance activities. Pt most challenged with backwards on complaint surfaces. Progressing toward goals.   Rehab Potential Good   Clinical Impairments Affecting Rehab Potential Pt is active and wants to get better   PT Frequency 2x / week   PT Duration 4 weeks   PT Treatment/Interventions ADLs/Self Care Home Management;Therapeutic exercise;Therapeutic activities;Functional mobility training;Stair training;Gait training;DME Instruction;Balance training;Neuromuscular re-education;Patient/family education;Orthotic Fit/Training;Manual techniques   PT Next Visit Plan dynamic high level balance activities, unlevel terrian   Consulted and Agree with Plan of Care Patient      Patient will benefit from skilled therapeutic intervention in order to improve the following deficits and impairments:  Decreased balance, Decreased coordination, Decreased mobility, Decreased strength, Postural dysfunction, Abnormal gait  Visit Diagnosis: Unsteadiness on feet  Other abnormalities of gait and mobility  Muscle weakness (generalized)     Problem List Patient Active Problem List   Diagnosis Date Noted  . Atrial  fibrillation ([place]) 06/11/2014   Willow Ora, PTA, [facility] [address], [place], [place] [address] [phone] 06/16/2015, 2:44 PM   Name: Jose Fuentes MRN: [id number] Date of Birth: 20-Aug-1940

## 2015-06-20 ENCOUNTER — Ambulatory Visit: Payer: Medicare Other | Admitting: Physical Therapy

## 2015-06-20 ENCOUNTER — Encounter: Payer: Self-pay | Admitting: Physical Therapy

## 2015-06-20 DIAGNOSIS — R2689 Other abnormalities of gait and mobility: Secondary | ICD-10-CM | POA: Diagnosis not present

## 2015-06-20 DIAGNOSIS — R2681 Unsteadiness on feet: Secondary | ICD-10-CM | POA: Diagnosis not present

## 2015-06-20 DIAGNOSIS — M6281 Muscle weakness (generalized): Secondary | ICD-10-CM | POA: Diagnosis not present

## 2015-06-20 NOTE — Therapy (Signed)
[facility] [address] [place], [place], [address] Phone: [phone]   Fax:  [phone]  Physical Therapy Treatment  Patient Details  Name: Jose Fuentes MRN: [id number] Date of Birth: 1940/03/21 Referring Provider: Laurann [place], MD  Encounter Date: 06/20/2015    06/20/15 0936  PT Visits / Re-Eval  Visit Number 8  Number of Visits 18  Date for PT Re-Evaluation 07/25/15  Authorization  Authorization Type Medicare need G codes  PT Time Calculation  PT Start Time 0932  PT Stop Time 1015  PT Time Calculation (min) 43 min  PT - End of Session  Equipment Utilized During Treatment Gait belt  Activity Tolerance Patient tolerated treatment well;No increased pain  Behavior During Therapy [facility] for tasks assessed/performed    Past Medical History  Diagnosis Date  . Hyperlipidemia   . Atrial fibrillation ([facility])   . Peyronie disease   . Hypertension     Past Surgical History  Procedure Laterality Date  . Knee surgery    . Tonsillectomy    . Thumb replacement      There were no vitals filed for this visit.      Subjective Assessment - 06/20/15 0936    Subjective No new complaints. No falls to report. No dizzness since last reported.    Pertinent History A-fib, Dyslipedmia, HTN, depressoin, thrombocyopenia, hypercholestrolemia   Limitations Walking;House hold activities   Patient Stated Goals Improvement in balance wants to be able to get back to woodoworking   Currently in Pain? No/denies            [facility] PT Assessment - 06/20/15 0938    Berg Balance Test   Sit to Stand Able to stand without using hands and stabilize independently   Standing Unsupported Able to stand safely 2 minutes   Sitting with Back Unsupported but Feet Supported on Floor or Stool Able to sit safely and securely 2 minutes   Stand to Sit Sits safely with minimal use of hands   Transfers Able to transfer safely, minor use of  hands   Standing Unsupported with Eyes Closed Able to stand 10 seconds safely   Standing Ubsupported with Feet Together Able to place feet together independently and stand 1 minute safely   From Standing, Reach Forward with Outstretched Arm Can reach confidently >25 cm (10")   From Standing Position, Pick up Object from Floor Able to pick up shoe safely and easily   From Standing Position, Turn to Look Behind Over each Shoulder Looks behind from both sides and weight shifts well   Turn 360 Degrees Able to turn 360 degrees safely one side only in 4 seconds or less  toward right only   Standing Unsupported, Alternately Place Feet on Step/Stool Able to stand independently and safely and complete 8 steps in 20 seconds  6.69 sec   Standing Unsupported, One Foot in Front Able to place foot tandem independently and hold 30 seconds   Standing on One Leg Able to lift leg independently and hold 5-10 seconds   Total Score 54   Functional Gait  Assessment   Gait assessed  Yes   Gait Level Surface Walks 20 ft in less than 5.5 sec, no assistive devices, good speed, no evidence for imbalance, normal gait pattern, deviates no more than 6 in outside of the 12 in walkway width.   Change in Gait Speed Able to smoothly change walking speed without loss of balance or gait deviation. Deviate no more  than 6 in outside of the 12 in walkway width.   Gait with Horizontal Head Turns Performs head turns smoothly with no change in gait. Deviates no more than 6 in outside 12 in walkway width   Gait with Vertical Head Turns Performs head turns with no change in gait. Deviates no more than 6 in outside 12 in walkway width.   Gait and Pivot Turn Pivot turns safely within 3 sec and stops quickly with no loss of balance.   Step Over Obstacle Is able to step over 2 stacked shoe boxes taped together (9 in total height) without changing gait speed. No evidence of imbalance.   Gait with Narrow Base of Support Is able to ambulate for  10 steps heel to toe with no staggering.   Gait with Eyes Closed Walks 20 ft, no assistive devices, good speed, no evidence of imbalance, normal gait pattern, deviates no more than 6 in outside 12 in walkway width. Ambulates 20 ft in less than 7 sec.   Ambulating Backwards Walks 20 ft, no assistive devices, good speed, no evidence for imbalance, normal gait   Steps Alternating feet, no rail.   Total Score 30   FGA comment: 30/30 scored today           [facility] Adult PT Treatment/Exercise - 06/20/15 0938    Ambulation/Gait   Ambulation/Gait Yes   Ambulation/Gait Assistance 7: Independent   Ambulation Distance (Feet) 1000 Feet   Assistive device None   Gait Pattern Within Functional Limits   Ambulation Surface Level;Unlevel;Indoor;Outdoor;Paved   Gait velocity 9.78 sec's= 3.35 ft/sec no AD            PT Short Term Goals - 06/20/15 0936    PT SHORT TERM GOAL #1   Title Patient is independent with initial HEP. (Target Date: 06/25/2015)   Baseline met on 06/20/15   Time --   Period --   Status Achieved   PT SHORT TERM GOAL #2   Title Patient ambulates and scans environment while maintaining path & pace. (Target Date: 06/25/2015)   Baseline met on 06/20/15   Time --   Period --   Status Achieved   PT SHORT TERM GOAL #3   Title Patient able to stand tandem for >30 seconds safely to indicate improvement in balance. (Target Date: 06/25/2015)   Baseline met on 06/20/15   Time --   Period --   Status Achieved           PT Long Term Goals - 06/20/15 0936    PT LONG TERM GOAL #1   Title Pt will be able to ambulate 1000' outside surfaces independently (Target Date: 07/25/2015)   Baseline met 06/20/15   Time --   Period --   Status Achieved   PT LONG TERM GOAL #2   Title Pt will improve FGA to 30/30 to enable community ambulation and functional gait (Target Date: 07/25/2015)   Baseline met on 06/20/15   Time --   Period --   Status Achieved   PT LONG TERM GOAL #3   Title Pt will  improve Berg Balance Score >/= 5456 to indicate decrease in fall risk (Target Date: 07/25/2015)   Baseline 06/20/15: 54/56 scored today   Time --   Period --   Status Achieved   PT LONG TERM GOAL #4   Title Patient is independent in ongoing HEP / fitness plan. (Target Date: 07/25/2015)   Time 2   Period Months   Status On-going  06/20/15 0936  Plan  Clinical Impression Statement Pt has met most all STGs and LTGs. Pt is to check into joining [employer] between now and next appt. Pt is to also bring any questions he has at that time as well. Plan will be to discharge at next visit.  Pt will benefit from skilled therapeutic intervention in order to improve on the following deficits Decreased balance;Decreased coordination;Decreased mobility;Decreased strength;Postural dysfunction;Abnormal gait  Rehab Potential Good  Clinical Impairments Affecting Rehab Potential Pt is active and wants to get better  PT Frequency 2x / week  PT Duration 4 weeks  PT Treatment/Interventions ADLs/Self Care Home Management;Therapeutic exercise;Therapeutic activities;Functional mobility training;Stair training;Gait training;DME Instruction;Balance training;Neuromuscular re-education;Patient/family education;Orthotic Fit/Training;Manual techniques  PT Next Visit Plan finalize pt's HEP/check on communtiy fitnes options, discharge per PT plan of care.  Consulted and Agree with Plan of Care Patient       Patient will benefit from skilled therapeutic intervention in order to improve the following deficits and impairments:  Decreased balance, Decreased coordination, Decreased mobility, Decreased strength, Postural dysfunction, Abnormal gait  Visit Diagnosis: Unsteadiness on feet  Other abnormalities of gait and mobility  Muscle weakness (generalized)     Problem List Patient Active Problem List   Diagnosis Date Noted  . Atrial fibrillation ([place]) 06/11/2014    Willow Ora, PTA, [facility] [address], [place], [place] [address] [phone] 06/20/2015, 10:11 AM   Name: Jose Fuentes MRN: [id number] Date of Birth: [birth year]

## 2015-06-23 ENCOUNTER — Ambulatory Visit: Payer: Medicare Other | Admitting: Physical Therapy

## 2015-06-27 ENCOUNTER — Encounter: Payer: Self-pay | Admitting: Physical Therapy

## 2015-06-27 ENCOUNTER — Ambulatory Visit: Payer: Medicare Other | Admitting: Physical Therapy

## 2015-06-27 DIAGNOSIS — M6281 Muscle weakness (generalized): Secondary | ICD-10-CM

## 2015-06-27 DIAGNOSIS — R2681 Unsteadiness on feet: Secondary | ICD-10-CM | POA: Diagnosis not present

## 2015-06-27 DIAGNOSIS — R2689 Other abnormalities of gait and mobility: Secondary | ICD-10-CM | POA: Diagnosis not present

## 2015-06-27 NOTE — Therapy (Signed)
[facility] [address] [place], [place], [address] Phone: [phone]   Fax:  [phone]  Physical Therapy Treatment  Patient Details  Name: Tra Wilemon MRN: [id number] Date of Birth: [birth year] Referring Provider: Harlan Stains, MD  Encounter Date: 06/27/2015      PT End of Session - 06/27/15 0930    Visit Number 9   Number of Visits 18   Date for PT Re-Evaluation 07/25/15   Authorization Type Medicare need G codes   PT Start Time 0930   PT Stop Time 4801  discharge visit, not all time needed   PT Time Calculation (min) 20 min   Equipment Utilized During Treatment Gait belt   Activity Tolerance Patient tolerated treatment well;No increased pain   Behavior During Therapy [facility] for tasks assessed/performed      Past Medical History  Diagnosis Date  . Hyperlipidemia   . Atrial fibrillation ([place])   . Peyronie disease   . Hypertension     Past Surgical History  Procedure Laterality Date  . Knee surgery    . Tonsillectomy    . Thumb replacement      There were no vitals filed for this visit.      Subjective Assessment - 06/27/15 0930    Subjective No new complaints. No falls to report. No dizzness since last reported.    Pertinent History A-fib, Dyslipedmia, HTN, depressoin, thrombocyopenia, hypercholestrolemia   Limitations Walking;House hold activities   Patient Stated Goals Improvement in balance wants to be able to get back to woodoworking   Currently in Pain? No/denies     Treatment: Updated HEP as follows today:   Feet Together (Compliant Surface) Head Motion - Eyes Closed    Stand on compliant surface: ___pillow_____ with feet together. Close eyes and move head slowly, up and down, side to side Repeat _5-10___ times per session. Do __1__ sessions per day.  Copyright  VHI. All rights reserved.   Walking on Heels    Walk on heels forward and then backwards using counter top  as needed for balance. Perform 3 laps each way. Do _1 sessions per day.  Copyright  VHI. All rights reserved.   Carpeted Surface With Diagonal Head Motion    Perform without assistive device. Walking on carpet/along wall, move head and eyes up to left for __2__ steps. Then, move head and eyes down to opposite side for _2__ steps. Repeat this while walking backwards to the starting point. Repeat sequence __3__ times per session. Do _1-2__ sessions per day. Copyright  VHI. All rights reserved.  Braiding    Move to side: 1) cross right leg in front of left, 2) bring back leg out to side, then 3) cross right leg behind left, 4) bring left leg out to side. Continue sequence in same direction. Reverse sequence, moving in opposite direction. Repeat sequence _3 times per session. Do __1-2_ sessions per day.  Copyright  VHI. All rights reserved.           PT Education - 06/27/15 0946    Education provided Yes   Education Details updated HEP for balanvce   Person(s) Educated Patient   Methods Explanation;Demonstration;Handout   Comprehension Verbalized understanding;Returned demonstration          PT Short Term Goals - 06/20/15 0936    PT SHORT TERM GOAL #1   Title Patient is independent with initial HEP. (Target Date: 06/25/2015)   Baseline met on 06/20/15   Time --  Period --   Status Achieved   PT SHORT TERM GOAL #2   Title Patient ambulates and scans environment while maintaining path & pace. (Target Date: 06/25/2015)   Baseline met on 06/20/15   Time --   Period --   Status Achieved   PT SHORT TERM GOAL #3   Title Patient able to stand tandem for >30 seconds safely to indicate improvement in balance. (Target Date: 06/25/2015)   Baseline met on 06/20/15   Time --   Period --   Status Achieved           PT Long Term Goals - 07/11/15 0931    PT LONG TERM GOAL #1   Title Pt will be able to ambulate 1000' outside surfaces independently (Target Date: 07/25/2015)    Baseline met 06/20/15   Status Achieved   PT LONG TERM GOAL #2   Title Pt will improve FGA to 30/30 to enable community ambulation and functional gait (Target Date: 07/25/2015)   Baseline met on 06/20/15   Status Achieved   PT LONG TERM GOAL #3   Title Pt will improve Berg Balance Score >/= 5456 to indicate decrease in fall risk (Target Date: 07/25/2015)   Baseline 06/20/15: 54/56 scored today   Status Achieved   PT LONG TERM GOAL #4   Title Patient is independent in ongoing HEP / fitness plan. (Target Date: 07/25/2015)   Baseline met on 07/11/2015   Status Achieved           Plan - 11-Jul-2015 0930    Clinical Impression Statement Pt has joined the [employer] with his spouse and they are to meet with a trainer either today or tomorrow. Today's session focused on HEP review and advancement. Pt has met all LTGs and is agreeable to discharge today.   Rehab Potential Good   Clinical Impairments Affecting Rehab Potential Pt is active and wants to get better   PT Frequency 2x / week   PT Duration 4 weeks   PT Treatment/Interventions ADLs/Self Care Home Management;Therapeutic exercise;Therapeutic activities;Functional mobility training;Stair training;Gait training;DME Instruction;Balance training;Neuromuscular re-education;Patient/family education;Orthotic Fit/Training;Manual techniques   PT Next Visit Plan discharge today.   Consulted and Agree with Plan of Care Patient      Patient will benefit from skilled therapeutic intervention in order to improve the following deficits and impairments:  Decreased balance, Decreased coordination, Decreased mobility, Decreased strength, Postural dysfunction, Abnormal gait  Visit Diagnosis: Unsteadiness on feet  Other abnormalities of gait and mobility  Muscle weakness (generalized)       G-Codes - 2015-07-11 0950    Functional Assessment Tool Used 54/56 Berg Balance Test, 30/30 Functional gait assessment   Functional Limitation Mobility: Walking and moving  around      Problem List Patient Active Problem List   Diagnosis Date Noted  . Atrial fibrillation ([place]) 06/11/2014    Willow Ora, PTA, [place] [address] [place], [place] [address] [phone] 07-11-15, 9:51 AM  Name: Infant Zink MRN: [id number] Date of Birth: 13-Jun-1940

## 2015-06-27 NOTE — Patient Instructions (Signed)
Feet Together (Compliant Surface) Head Motion - Eyes Closed    Stand on compliant surface: ___pillow_____ with feet together. Close eyes and move head slowly, up and down, side to side and diagonals.  Repeat _5-10___ times per session. Do __1__ sessions per day.  Copyright  VHI. All rights reserved.  Walking on Heels    Walk on heels forward and then backwards using counter top as needed for balance. Perform 3 laps each way. Do _1 sessions per day.  Copyright  VHI. All rights reserved.   Carpeted Surface With Diagonal Head Motion    Perform without assistive device. Walking on carpet/along wall, move head and eyes up to left for __2__ steps. Then, move head and eyes down to opposite side for _2__ steps. Repeat this while walking backwards to the starting point. Repeat sequence __3__ times per session. Do _1-2__ sessions per day. Copyright  VHI. All rights reserved.  Braiding    Move to side: 1) cross right leg in front of left, 2) bring back leg out to side, then 3) cross right leg behind left, 4) bring left leg out to side. Continue sequence in same direction. Reverse sequence, moving in opposite direction. Repeat sequence _3 times per session. Do __1-2_ sessions per day.  Copyright  VHI. All rights reserved.

## 2015-07-10 ENCOUNTER — Encounter: Payer: Self-pay | Admitting: Physical Therapy

## 2015-07-10 NOTE — Therapy (Signed)
[facility] [address] [place], [place], [address] Phone: [phone]   Fax:  [phone]  Patient Details  Name: Calyn Rubi MRN: [id number] Date of Birth: [birth year] Referring Provider:  Harlan Stains, MD  Encounter Date: 07/10/2015   PHYSICAL THERAPY DISCHARGE SUMMARY  Visits from Start of Care: 9  Current functional level related to goals / functional outcomes:     PT Long Term Goals - 06/27/15 0931    PT LONG TERM GOAL #1   Title Pt will be able to ambulate 1000' outside surfaces independently (Target Date: 07/25/2015)   Baseline met 06/20/15   Status Achieved   PT LONG TERM GOAL #2   Title Pt will improve FGA to 30/30 to enable community ambulation and functional gait (Target Date: 07/25/2015)   Baseline met on 06/20/15   Status Achieved   PT LONG TERM GOAL #3   Title Pt will improve Berg Balance Score >/= 5456 to indicate decrease in fall risk (Target Date: 07/25/2015)   Baseline 06/20/15: 54/56 scored today   Status Achieved   PT LONG TERM GOAL #4   Title Patient is independent in ongoing HEP / fitness plan. (Target Date: 07/25/2015)   Baseline met on 06/27/15   Status Achieved        Remaining deficits: Patient tests with minimal fall risk as noted above   Education / Equipment: HEP  Plan: Patient agrees to discharge.  Patient goals were met. Patient is being discharged due to meeting the stated rehab goals.  ?????       Maryiah Olvey PT, DPT 07/10/2015, 12:57 PM  [facility] [address] [place], [place], [address] Phone: [phone]   Fax:  [phone]

## 2015-07-21 DIAGNOSIS — Z1211 Encounter for screening for malignant neoplasm of colon: Secondary | ICD-10-CM | POA: Diagnosis not present

## 2015-07-21 DIAGNOSIS — E785 Hyperlipidemia, unspecified: Secondary | ICD-10-CM | POA: Diagnosis not present

## 2015-07-21 DIAGNOSIS — F324 Major depressive disorder, single episode, in partial remission: Secondary | ICD-10-CM | POA: Diagnosis not present

## 2015-07-21 DIAGNOSIS — R7301 Impaired fasting glucose: Secondary | ICD-10-CM | POA: Diagnosis not present

## 2015-07-21 DIAGNOSIS — R296 Repeated falls: Secondary | ICD-10-CM | POA: Diagnosis not present

## 2015-07-21 DIAGNOSIS — I10 Essential (primary) hypertension: Secondary | ICD-10-CM | POA: Diagnosis not present

## 2015-07-28 ENCOUNTER — Encounter: Payer: Self-pay | Admitting: Gastroenterology

## 2015-07-28 DIAGNOSIS — D692 Other nonthrombocytopenic purpura: Secondary | ICD-10-CM | POA: Diagnosis not present

## 2015-07-28 DIAGNOSIS — Z85820 Personal history of malignant melanoma of skin: Secondary | ICD-10-CM | POA: Diagnosis not present

## 2015-08-18 ENCOUNTER — Other Ambulatory Visit: Payer: Self-pay | Admitting: Family Medicine

## 2015-08-18 DIAGNOSIS — Z136 Encounter for screening for cardiovascular disorders: Secondary | ICD-10-CM

## 2015-08-18 DIAGNOSIS — F17201 Nicotine dependence, unspecified, in remission: Secondary | ICD-10-CM

## 2015-08-29 DIAGNOSIS — H2513 Age-related nuclear cataract, bilateral: Secondary | ICD-10-CM | POA: Diagnosis not present

## 2015-08-29 DIAGNOSIS — H5213 Myopia, bilateral: Secondary | ICD-10-CM | POA: Diagnosis not present

## 2015-09-01 ENCOUNTER — Ambulatory Visit
Admission: RE | Admit: 2015-09-01 | Discharge: 2015-09-01 | Disposition: A | Discharge status: Home / Self Care | Payer: Medicare Other | Source: Ambulatory Visit | Attending: Family Medicine | Admitting: Family Medicine

## 2015-09-01 DIAGNOSIS — Z87891 Personal history of nicotine dependence: Secondary | ICD-10-CM | POA: Diagnosis not present

## 2015-09-01 DIAGNOSIS — Z136 Encounter for screening for cardiovascular disorders: Secondary | ICD-10-CM

## 2015-09-01 DIAGNOSIS — F17201 Nicotine dependence, unspecified, in remission: Secondary | ICD-10-CM

## 2015-09-23 DIAGNOSIS — F458 Other somatoform disorders: Secondary | ICD-10-CM | POA: Diagnosis not present

## 2015-09-23 DIAGNOSIS — F324 Major depressive disorder, single episode, in partial remission: Secondary | ICD-10-CM | POA: Diagnosis not present

## 2015-09-23 DIAGNOSIS — Z23 Encounter for immunization: Secondary | ICD-10-CM | POA: Diagnosis not present

## 2015-09-26 DIAGNOSIS — L570 Actinic keratosis: Secondary | ICD-10-CM | POA: Diagnosis not present

## 2015-09-26 DIAGNOSIS — D225 Melanocytic nevi of trunk: Secondary | ICD-10-CM | POA: Diagnosis not present

## 2015-09-26 DIAGNOSIS — L812 Freckles: Secondary | ICD-10-CM | POA: Diagnosis not present

## 2015-09-26 DIAGNOSIS — D2271 Melanocytic nevi of right lower limb, including hip: Secondary | ICD-10-CM | POA: Diagnosis not present

## 2015-09-26 DIAGNOSIS — D1801 Hemangioma of skin and subcutaneous tissue: Secondary | ICD-10-CM | POA: Diagnosis not present

## 2015-09-26 DIAGNOSIS — L821 Other seborrheic keratosis: Secondary | ICD-10-CM | POA: Diagnosis not present

## 2015-09-26 DIAGNOSIS — D2272 Melanocytic nevi of left lower limb, including hip: Secondary | ICD-10-CM | POA: Diagnosis not present

## 2015-09-26 DIAGNOSIS — Z85820 Personal history of malignant melanoma of skin: Secondary | ICD-10-CM | POA: Diagnosis not present

## 2015-10-20 DIAGNOSIS — E785 Hyperlipidemia, unspecified: Secondary | ICD-10-CM | POA: Diagnosis not present

## 2015-10-20 DIAGNOSIS — F324 Major depressive disorder, single episode, in partial remission: Secondary | ICD-10-CM | POA: Diagnosis not present

## 2015-10-20 DIAGNOSIS — R109 Unspecified abdominal pain: Secondary | ICD-10-CM | POA: Diagnosis not present

## 2015-10-20 DIAGNOSIS — M545 Low back pain: Secondary | ICD-10-CM | POA: Diagnosis not present

## 2015-10-20 DIAGNOSIS — F458 Other somatoform disorders: Secondary | ICD-10-CM | POA: Diagnosis not present

## 2015-10-24 ENCOUNTER — Other Ambulatory Visit: Payer: Self-pay | Admitting: Family Medicine

## 2015-10-24 ENCOUNTER — Ambulatory Visit
Admission: RE | Admit: 2015-10-24 | Discharge: 2015-10-24 | Disposition: A | Discharge status: Home / Self Care | Payer: Medicare Other | Source: Ambulatory Visit | Attending: Family Medicine | Admitting: Family Medicine

## 2015-10-24 DIAGNOSIS — R109 Unspecified abdominal pain: Secondary | ICD-10-CM

## 2015-10-24 DIAGNOSIS — M546 Pain in thoracic spine: Secondary | ICD-10-CM | POA: Diagnosis not present

## 2015-10-24 DIAGNOSIS — R079 Chest pain, unspecified: Secondary | ICD-10-CM | POA: Diagnosis not present

## 2015-10-27 DIAGNOSIS — K648 Other hemorrhoids: Secondary | ICD-10-CM | POA: Diagnosis not present

## 2015-10-27 DIAGNOSIS — K644 Residual hemorrhoidal skin tags: Secondary | ICD-10-CM | POA: Diagnosis not present

## 2015-10-27 DIAGNOSIS — D124 Benign neoplasm of descending colon: Secondary | ICD-10-CM | POA: Diagnosis not present

## 2015-10-27 DIAGNOSIS — D123 Benign neoplasm of transverse colon: Secondary | ICD-10-CM | POA: Diagnosis not present

## 2015-10-27 DIAGNOSIS — D126 Benign neoplasm of colon, unspecified: Secondary | ICD-10-CM | POA: Diagnosis not present

## 2015-10-27 DIAGNOSIS — D120 Benign neoplasm of cecum: Secondary | ICD-10-CM | POA: Diagnosis not present

## 2015-10-27 DIAGNOSIS — Z1211 Encounter for screening for malignant neoplasm of colon: Secondary | ICD-10-CM | POA: Diagnosis not present

## 2015-10-30 DIAGNOSIS — Z1211 Encounter for screening for malignant neoplasm of colon: Secondary | ICD-10-CM | POA: Diagnosis not present

## 2015-10-30 DIAGNOSIS — D126 Benign neoplasm of colon, unspecified: Secondary | ICD-10-CM | POA: Diagnosis not present

## 2015-11-07 ENCOUNTER — Ambulatory Visit: Payer: Medicare Other | Attending: Family Medicine | Admitting: Physical Therapy

## 2015-11-07 ENCOUNTER — Encounter: Payer: Self-pay | Admitting: Physical Therapy

## 2015-11-07 DIAGNOSIS — M545 Low back pain, unspecified: Secondary | ICD-10-CM

## 2015-11-07 DIAGNOSIS — M62830 Muscle spasm of back: Secondary | ICD-10-CM

## 2015-11-07 DIAGNOSIS — G8929 Other chronic pain: Secondary | ICD-10-CM | POA: Diagnosis not present

## 2015-11-07 DIAGNOSIS — R2681 Unsteadiness on feet: Secondary | ICD-10-CM | POA: Insufficient documentation

## 2015-11-07 DIAGNOSIS — M6281 Muscle weakness (generalized): Secondary | ICD-10-CM | POA: Diagnosis not present

## 2015-11-07 DIAGNOSIS — M546 Pain in thoracic spine: Secondary | ICD-10-CM | POA: Diagnosis not present

## 2015-11-07 DIAGNOSIS — R2689 Other abnormalities of gait and mobility: Secondary | ICD-10-CM | POA: Diagnosis not present

## 2015-11-07 NOTE — Therapy (Signed)
[place], [place], [address] Phone: [phone]   Fax:  [phone]  Physical Therapy Evaluation  Patient Details  Name: Jose Fuentes MRN: [id number] Date of Birth: 03-28-1940 Referring Provider: Dr. Dema Severin  Encounter Date: 11/07/2015      PT End of Session - 11/07/15 0951    Visit Number 1   Date for PT Re-Evaluation 01/02/16   PT Start Time 0845   PT Stop Time 0940   PT Time Calculation (min) 55 min   Activity Tolerance Patient tolerated treatment well;No increased pain   Behavior During Therapy WFL for tasks assessed/performed      Past Medical History:  Diagnosis Date  . Atrial fibrillation ([place])   . Hyperlipidemia   . Hypertension   . Peyronie disease     Past Surgical History:  Procedure Laterality Date  . KNEE SURGERY    . Thumb replacement    . TONSILLECTOMY      There were no vitals filed for this visit.       Subjective Assessment - 11/07/15 0852    Subjective Patient states that about 3 years ago, he went to lift about "300 pounds" with his wife and at that time, he felt "something slip" and began to have low back pain. Since that time, for about a year, he has always had a "dull" pain in the low back and in the middle of his back. He states the pain is on the right side only.    Limitations Sitting;Lifting;House hold activities   How long can you sit comfortably? 1 hour   How long can you walk comfortably? 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 Yes   Pain Score 1   at worst 10/10.    Pain Location Back   Pain Orientation Right;Mid;Lower   Pain Descriptors / Indicators Dull;Sharp   Pain Type Chronic pain   Aggravating Factors  forward flexion   Pain Relieving  Factors sitting "quietly", sleeping on back   Multiple Pain Sites No            [facility] PT Assessment - 11/07/15 0001      Assessment   Medical Diagnosis Arthritic spine, bone spurs   Referring Provider Dr. Dema Severin   Next MD Visit 2 weeks from today   Prior Therapy not for back  PT for balance for 6 weeks stopped about 6 months ago      Precautions   Precautions None     Balance Screen   Has the patient fallen in the past 6 months Yes   How many times? 1-2 times   Has the patient had a decrease in activity level because of a fear of falling?  Yes   Is the patient reluctant to leave their home because of a fear of falling?  No     Home Environment   Living Environment Private residence   Living Arrangements Spouse/significant other   Type of [place] to enter   Entrance Stairs-Number of Steps 14  from 1st to 2nd floor     Prior Function   Level of Independence Independent   Vocation Retired   Leisure Wood-work, Designer, industrial/product, hiking     Tone   Assessment Location Right  Lower Extremity;Left Lower Extremity     ROM / Strength   AROM / PROM / Strength Strength;PROM;AROM     AROM   Overall AROM Comments Forward flexion: stopped within 1st 25% P! .  R SB  25 % limited by pain. Every other motion WNL     PROM   Overall PROM Comments HS 90/90 Rt: lack 35 degrees from neutral, Lt: lack 45 degrees from neutral   PROM Assessment Site Knee   Right/Left Knee Right;Left     Strength   Strength Assessment Site Hip;Knee;Ankle   Right/Left Hip Right;Left   Right Hip Flexion 4/5   Left Hip Flexion 4-/5  felt pull in right lower back   Right/Left Knee Right;Left   Right Knee Flexion 5/5   Right Knee Extension 5/5   Left Knee Flexion 5/5   Left Knee Extension 5/5   Right/Left Ankle Right;Left   Right Ankle Dorsiflexion 5/5   Right Ankle Plantar Flexion 5/5  functional toe raise 10x   Left Ankle Dorsiflexion 5/5   Left Ankle Plantar Flexion 5/5   functional toe raise 10x     RLE Tone   RLE Tone Hypertonic     RLE Tone   Hypertonic Details HS 90/90, felt as if pt was resisting the through PROM but pt reports he was not resisting at all     LLE Tone   LLE Tone Hypertonic     LLE Tone   Hypertonic Details HS 90/90 felt as if the pt was resisting the whole way througH PROM                           PT Education - 11/07/15 0950    Education provided Yes   Education Details HEP given: HS stretch, gastroc stretch, pelvic tilt   Person(s) Educated Patient   Methods Demonstration;Tactile cues;Verbal cues;Explanation   Comprehension Verbalized understanding;Returned demonstration          PT Short Term Goals - 11/07/15 0959      PT SHORT TERM GOAL #1   Title Pt will demonstrate recall of HEP.           PT Long Term Goals - 11/07/15 1001      PT LONG TERM GOAL #1   Title Pt will be increase hip flexion strength to 4+/5 bilaterally in order to improve functional abilities.   Time 8   Period Weeks   Status New     PT LONG TERM GOAL #2   Title Pt will report 0/10 pain while performing household activities such as gardening and woodshop work.   Time 8   Period Weeks   Status New     PT LONG TERM GOAL #3   Title Pt will be able to perform forwad flexion and Rt SB without any increase in symptoms in order to improve functional mobility.   Time 8   Period Weeks   Status New               Plan - 11/07/15 0951    Clinical Impression Statement Patient presents with low to mid back pain on the right side. Patient has very tight hamstrings bilaterally and weak hip flexor strength. Patient reports that he has never stretched consistently if ever. Patient also has history of falls and reports balance issues. At end of treatment, patient rpeorts decreased symptoms.   Rehab Potential Good   PT Frequency 2x / week  PT Duration 8 weeks   PT Treatment/Interventions ADLs/Self Care Home  Management;Electrical Stimulation;Moist Heat;Cryotherapy;Gait training;Stair training;Patient/family education;Balance training;Therapeutic exercise;Therapeutic activities;Manual techniques   PT Next Visit Plan Pelvic tilt progression, prone on elbows, scap retract with core stability, heat,    PT Home Exercise Plan HS, gastroc stretch, pelvic tilts   Consulted and Agree with Plan of Care Patient      Patient will benefit from skilled therapeutic intervention in order to improve the following deficits and impairments:  Decreased activity tolerance, Decreased balance, Decreased coordination, Decreased strength, Pain, Improper body mechanics, Postural dysfunction, Impaired flexibility  Visit Diagnosis: Chronic right-sided low back pain without sciatica  Muscle spasm of back  Pain in thoracic spine     Problem List Patient Active Problem List   Diagnosis Date Noted  . Atrial fibrillation ([place]) 06/11/2014    Toy Baker, SPT 11/07/2015, 10:14 AM  [facility] [address] [place] [address] [place], [place], [address] Phone: [phone]   Fax:  [phone]  Name: Jose Fuentes MRN: [id number] Date of Birth: 04/09/1940

## 2015-11-10 ENCOUNTER — Ambulatory Visit: Payer: Medicare Other | Admitting: Physical Therapy

## 2015-11-10 ENCOUNTER — Encounter: Payer: Self-pay | Admitting: Physical Therapy

## 2015-11-10 DIAGNOSIS — G8929 Other chronic pain: Secondary | ICD-10-CM | POA: Diagnosis not present

## 2015-11-10 DIAGNOSIS — M545 Low back pain, unspecified: Secondary | ICD-10-CM

## 2015-11-10 DIAGNOSIS — M546 Pain in thoracic spine: Secondary | ICD-10-CM | POA: Diagnosis not present

## 2015-11-10 DIAGNOSIS — R2681 Unsteadiness on feet: Secondary | ICD-10-CM | POA: Diagnosis not present

## 2015-11-10 DIAGNOSIS — R2689 Other abnormalities of gait and mobility: Secondary | ICD-10-CM | POA: Diagnosis not present

## 2015-11-10 DIAGNOSIS — M62830 Muscle spasm of back: Secondary | ICD-10-CM | POA: Diagnosis not present

## 2015-11-10 DIAGNOSIS — M6281 Muscle weakness (generalized): Secondary | ICD-10-CM

## 2015-11-10 NOTE — Therapy (Signed)
[place], [place], [address] Phone: [phone]   Fax:  [phone]  Physical Therapy Treatment  Patient Details  Name: Jose Fuentes MRN: [id number] Date of Birth: 30-Jun-1940 Referring Provider: Dr. Dema Severin  Encounter Date: 11/10/2015      PT End of Session - 11/10/15 1336    Visit Number 2   Date for PT Re-Evaluation 01/02/16   PT Start Time 0105   PT Stop Time 0148   PT Time Calculation (min) 43 min   Activity Tolerance Patient tolerated treatment well;No increased pain   Behavior During Therapy WFL for tasks assessed/performed      Past Medical History:  Diagnosis Date  . Atrial fibrillation ([place])   . Hyperlipidemia   . Hypertension   . Peyronie disease     Past Surgical History:  Procedure Laterality Date  . KNEE SURGERY    . Thumb replacement    . TONSILLECTOMY      There were no vitals filed for this visit.      Subjective Assessment - 11/10/15 1306    Subjective Patient reports feeling "good" today. He states his back is feeling better since the initial evaluation and he has been dong all of his stretches at home. Patient states he has already been to the [facility] and exercised this morning.   Limitations Sitting;Lifting;House hold activities   How long can you walk comfortably? walking downhill is only time he feel discomfort   Diagnostic tests Xrays: positive for "arthritis in spine and bone spurs"   Patient Stated Goals reduce pain, bending forward without discomfort, lifitng carefully                         [facility] Adult PT Treatment/Exercise - 11/10/15 0001      Neuro Re-ed    Neuro Re-ed Details  Airex pad, dbl leg stance, staggered stance, feet together, lumbar rotation with yellow band  30 seconds at a time     Exercises   Exercises Knee/Hip;Lumbar;Shoulder     Lumbar Exercises: Aerobic   Stationary Bike NuStep lvl 4, 6 minutes     Lumbar  Exercises: Standing   Other Standing Lumbar Exercises airex pad, lumbar rotation with 3 lb     Knee/Hip Exercises: Standing   Hip Flexion Stengthening;Left;Right;20 reps  marching with belt resistance   Hip Abduction Stengthening;10 reps;Knee straight;Right;Left  2x10     Shoulder Exercises: Standing   Extension Strengthening;Theraband;10 reps;Both  2 sets   Theraband Level (Shoulder Extension) Level 3 (Green)   Row Strengthening;10 reps;Theraband  2 sets   Theraband Level (Shoulder Row) Level 3 (Green)     Modalities   Modalities Moist Heat     Moist Heat Therapy   Number Minutes Moist Heat 10 Minutes   Moist Heat Location Lumbar Spine     Manual Therapy   Manual Therapy Passive ROM   Manual therapy comments HS stretch, gastroc stretch, piriformis                PT Education - 11/10/15 1335    Education provided Yes   Education Details focus on core engagement, LE active stretches   Person(s) Educated Patient   Methods Explanation   Comprehension Verbalized understanding          PT Short Term Goals - 11/07/15 0959      PT SHORT TERM GOAL #1   Title Pt will demonstrate  recall of HEP.           PT Long Term Goals - 11/07/15 1001      PT LONG TERM GOAL #1   Title Pt will be increase hip flexion strength to 4+/5 bilaterally in order to improve functional abilities.   Time 8   Period Weeks   Status New     PT LONG TERM GOAL #2   Title Pt will report 0/10 pain while performing household activities such as gardening and woodshop work.   Time 8   Period Weeks   Status New     PT LONG TERM GOAL #3   Title Pt will be able to perform forwad flexion and Rt SB without any increase in symptoms in order to improve functional mobility.   Time 8   Period Weeks   Status New               Plan - 11/10/15 1337    Clinical Impression Statement Patient tolerated treatment well and was able to complete all exercises without increased symptoms. Focus  was placed on dynamic exercises that incporporated balnce deficits, LE strength deficits, and core engagement. Continue to progress per pt tolerance.   Rehab Potential Good   PT Frequency 2x / week   PT Duration 8 weeks   PT Treatment/Interventions ADLs/Self Care Home Management;Electrical Stimulation;Moist Heat;Cryotherapy;Gait training;Stair training;Patient/family education;Balance training;Therapeutic exercise;Therapeutic activities;Manual techniques   PT Next Visit Plan Core stability in standing, balance/neuroreeducation   PT Home Exercise Plan HS, gastroc stretch, pelvic tilts   Consulted and Agree with Plan of Care Patient      Patient will benefit from skilled therapeutic intervention in order to improve the following deficits and impairments:  Decreased activity tolerance, Decreased balance, Decreased coordination, Decreased strength, Pain, Improper body mechanics, Postural dysfunction, Impaired flexibility  Visit Diagnosis: Chronic right-sided low back pain without sciatica  Muscle spasm of back  Pain in thoracic spine  Unsteadiness on feet  Other abnormalities of gait and mobility  Muscle weakness (generalized)     Problem List Patient Active Problem List   Diagnosis Date Noted  . Atrial fibrillation ([place]) 06/11/2014    Toy Baker, SPT 11/10/2015, 1:40 PM  [place], [place], [address] Phone: [phone]   Fax:  [phone]  Name: Jose Fuentes MRN: [id number] Date of Birth: 1940-12-22

## 2015-11-14 ENCOUNTER — Encounter: Payer: Self-pay | Admitting: Physical Therapy

## 2015-11-14 ENCOUNTER — Ambulatory Visit: Payer: Medicare Other | Admitting: Physical Therapy

## 2015-11-14 DIAGNOSIS — M545 Low back pain: Secondary | ICD-10-CM | POA: Diagnosis not present

## 2015-11-14 DIAGNOSIS — M546 Pain in thoracic spine: Secondary | ICD-10-CM | POA: Diagnosis not present

## 2015-11-14 DIAGNOSIS — R2681 Unsteadiness on feet: Secondary | ICD-10-CM

## 2015-11-14 DIAGNOSIS — G8929 Other chronic pain: Secondary | ICD-10-CM

## 2015-11-14 DIAGNOSIS — M62830 Muscle spasm of back: Secondary | ICD-10-CM

## 2015-11-14 DIAGNOSIS — R2689 Other abnormalities of gait and mobility: Secondary | ICD-10-CM | POA: Diagnosis not present

## 2015-11-14 NOTE — Therapy (Signed)
[place], [place], [address] Phone: [phone]   Fax:  [phone]  Physical Therapy Treatment  Patient Details  Name: Jose Fuentes MRN: [id number] Date of Birth: 12/20/1940 Referring Provider: Dr. Dema Severin  Encounter Date: 11/14/2015      PT End of Session - 11/14/15 1004    Visit Number 3   Date for PT Re-Evaluation 01/02/16   PT Start Time 0926   PT Stop Time 1019   PT Time Calculation (min) 53 min   Activity Tolerance Patient tolerated treatment well;No increased pain   Behavior During Therapy WFL for tasks assessed/performed      Past Medical History:  Diagnosis Date  . Atrial fibrillation ([place])   . Hyperlipidemia   . Hypertension   . Peyronie disease     Past Surgical History:  Procedure Laterality Date  . KNEE SURGERY    . Thumb replacement    . TONSILLECTOMY      There were no vitals filed for this visit.      Subjective Assessment - 11/14/15 0923    Subjective Pt reports that he was carrying the laundry yesterday causing some increase stress on his lower back. Pt reports that his back is a lot better compared to when he started.   Currently in Pain? Yes   Pain Score 2    Pain Location Back   Pain Orientation Right;Lower                         [facility] Adult PT Treatment/Exercise - 11/14/15 0001      Lumbar Exercises: Stretches   Passive Hamstring Stretch 5 reps;10 seconds     Lumbar Exercises: Aerobic   Stationary Bike NuStep lvl 4, 6 minutes     Lumbar Exercises: Machines for Strengthening   Cybex Knee Extension 10lb 2x10   Cybex Knee Flexion 25lb 2x15   Other Lumbar Machine Exercise Rows 35lb 2x10   Other Lumbar Machine Exercise Lat 35lb 2x10      Lumbar Exercises: Seated   Sit to Stand 10 reps  x2, with 6lb dumbbell     Lumbar Exercises: Supine   Bridge 10 reps;2 seconds  x2   Other Supine Lumbar Exercises physo ball  bridges,K2C, rotations      Modalities   Modalities Moist Heat;Electrical Stimulation     Moist Heat Therapy   Number Minutes Moist Heat 15 Minutes   Moist Heat Location Lumbar Spine     Electrical Stimulation   Electrical Stimulation Location 15   Electrical Stimulation Action IFC   Electrical Stimulation Parameters supine, to pt tolerance   Electrical Stimulation Goals Pain                  PT Short Term Goals - 11/07/15 0959      PT SHORT TERM GOAL #1   Title Pt will demonstrate recall of HEP.           PT Long Term Goals - 11/07/15 1001      PT LONG TERM GOAL #1   Title Pt will be increase hip flexion strength to 4+/5 bilaterally in order to improve functional abilities.   Time 8   Period Weeks   Status New     PT LONG TERM GOAL #2   Title Pt will report 0/10 pain while performing household activities such as gardening and woodshop work.   Time 8  Period Weeks   Status New     PT LONG TERM GOAL #3   Title Pt will be able to perform forwad flexion and Rt SB without any increase in symptoms in order to improve functional mobility.   Time 8   Period Weeks   Status New               Plan - 11/14/15 1005    Clinical Impression Statement Pt tolerated all of today's exercises well, evident by no subjective reports of increase pain.    Rehab Potential Good   PT Frequency 2x / week   PT Duration 8 weeks   PT Treatment/Interventions ADLs/Self Care Home Management;Electrical Stimulation;Moist Heat;Cryotherapy;Gait training;Stair training;Patient/family education;Balance training;Therapeutic exercise;Therapeutic activities;Manual techniques   PT Next Visit Plan Core stability in standing, balance/neuroreeducation, functional box carry      Patient will benefit from skilled therapeutic intervention in order to improve the following deficits and impairments:  Decreased activity tolerance, Decreased balance, Decreased coordination, Decreased strength,  Pain, Improper body mechanics, Postural dysfunction, Impaired flexibility  Visit Diagnosis: Chronic right-sided low back pain without sciatica  Muscle spasm of back  Unsteadiness on feet  Pain in thoracic spine     Problem List Patient Active Problem List   Diagnosis Date Noted  . Atrial fibrillation ([place]) 06/11/2014    Scot Jun, PTA 11/14/2015, 10:07 AM  [facility] [address] [place], [place], [address] Phone: [phone]   Fax:  [phone]  Name: Jose Fuentes MRN: [id number] Date of Birth: [birth year]

## 2015-11-17 ENCOUNTER — Ambulatory Visit: Payer: Medicare Other | Admitting: Physical Therapy

## 2015-11-17 ENCOUNTER — Encounter: Payer: Self-pay | Admitting: Physical Therapy

## 2015-11-17 DIAGNOSIS — M546 Pain in thoracic spine: Secondary | ICD-10-CM | POA: Diagnosis not present

## 2015-11-17 DIAGNOSIS — M62830 Muscle spasm of back: Secondary | ICD-10-CM

## 2015-11-17 DIAGNOSIS — M545 Low back pain: Secondary | ICD-10-CM | POA: Diagnosis not present

## 2015-11-17 DIAGNOSIS — R2681 Unsteadiness on feet: Secondary | ICD-10-CM | POA: Diagnosis not present

## 2015-11-17 DIAGNOSIS — G8929 Other chronic pain: Secondary | ICD-10-CM | POA: Diagnosis not present

## 2015-11-17 DIAGNOSIS — R2689 Other abnormalities of gait and mobility: Secondary | ICD-10-CM | POA: Diagnosis not present

## 2015-11-17 NOTE — Therapy (Signed)
[place], [place], [address] Phone: [phone]   Fax:  [phone]  Physical Therapy Treatment  Patient Details  Name: Jose Fuentes MRN: [id number] Date of Birth: 1940/05/16 Referring Provider: Dr. Dema Severin  Encounter Date: 11/17/2015      PT End of Session - 11/17/15 1433    Visit Number 4   Date for PT Re-Evaluation 01/02/16   PT Start Time [id number]   PT Stop Time 1448   PT Time Calculation (min) 63 min   Activity Tolerance Patient tolerated treatment well;No increased pain   Behavior During Therapy WFL for tasks assessed/performed      Past Medical History:  Diagnosis Date  . Atrial fibrillation ([place])   . Hyperlipidemia   . Hypertension   . Peyronie disease     Past Surgical History:  Procedure Laterality Date  . KNEE SURGERY    . Thumb replacement    . TONSILLECTOMY      There were no vitals filed for this visit.      Subjective Assessment - 11/17/15 1347    Subjective Pt reports some soreness in the back of his legs from last session. Pt also reports that he was at the gym today on the torso machine and he experienced some dull pain   Currently in Pain? Yes   Pain Score 3    Pain Location Back   Pain Orientation Right                         [facility] Adult PT Treatment/Exercise - 11/17/15 0001      Lumbar Exercises: Stretches   Passive Hamstring Stretch 5 reps;10 seconds   Piriformis Stretch 3 reps;10 seconds     Lumbar Exercises: Aerobic   Stationary Bike NuStep lvl 4, 7 minutes     Lumbar Exercises: Machines for Strengthening   Cybex Knee Extension 10lb 3x10   Cybex Knee Flexion 25lb 3x10   Leg Press 30lb 2x15   Other Lumbar Machine Exercise Rows 35lb 2x15   Other Lumbar Machine Exercise Lat 35lb 2x10      Lumbar Exercises: Standing   Other Standing Lumbar Exercises Standing trunk rotations yellow ball x20   Other Standing Lumbar Exercises  Sanctioning overhead back ext blue ball x10, yellow ball x15     Modalities   Modalities Moist Heat;Electrical Stimulation     Moist Heat Therapy   Number Minutes Moist Heat 15 Minutes   Moist Heat Location Lumbar Spine     Electrical Stimulation   Electrical Stimulation Location 15   Electrical Stimulation Action IFC   Electrical Stimulation Parameters supine, pt tolerance    Electrical Stimulation Goals Pain                  PT Short Term Goals - 11/07/15 0959      PT SHORT TERM GOAL #1   Title Pt will demonstrate recall of HEP.           PT Long Term Goals - 11/17/15 1434      PT LONG TERM GOAL #1   Title Pt will be increase hip flexion strength to 4+/5 bilaterally in order to improve functional abilities.   Status On-going     PT LONG TERM GOAL #2   Title Pt will report 0/10 pain while performing household activities such as gardening and woodshop work.   Status On-going     PT  LONG TERM GOAL #3   Title Pt will be able to perform forwad flexion and Rt SB without any increase in symptoms in order to improve functional mobility.   Status On-going               Plan - 11/17/15 1434    Clinical Impression Statement Pt is progressing towards goals, able to tolerate all of today's activities without pain.    Rehab Potential Good   PT Frequency 2x / week   PT Duration 8 weeks   PT Treatment/Interventions ADLs/Self Care Home Management;Electrical Stimulation;Moist Heat;Cryotherapy;Gait training;Stair training;Patient/family education;Balance training;Therapeutic exercise;Therapeutic activities;Manual techniques   PT Next Visit Plan Core stability in standing, balance/neuroreeducation, functional box carry      Patient will benefit from skilled therapeutic intervention in order to improve the following deficits and impairments:  Decreased activity tolerance, Decreased balance, Decreased coordination, Decreased strength, Pain, Improper body mechanics,  Postural dysfunction, Impaired flexibility  Visit Diagnosis: Chronic right-sided low back pain without sciatica  Muscle spasm of back  Unsteadiness on feet  Pain in thoracic spine     Problem List Patient Active Problem List   Diagnosis Date Noted  . Atrial fibrillation ([place]) 06/11/2014    Scot Jun 11/17/2015, 2:35 PM  [place] [address] [place], [place], [address] Phone: [phone]   Fax:  [phone]  Name: Jose Fuentes MRN: [id number] Date of Birth: [birth year]

## 2015-11-19 ENCOUNTER — Encounter: Payer: Self-pay | Admitting: Neurology

## 2015-11-21 ENCOUNTER — Ambulatory Visit: Payer: Medicare Other | Admitting: Physical Therapy

## 2015-11-21 ENCOUNTER — Encounter: Payer: Self-pay | Admitting: Physical Therapy

## 2015-11-21 DIAGNOSIS — M6281 Muscle weakness (generalized): Secondary | ICD-10-CM

## 2015-11-21 DIAGNOSIS — M545 Low back pain, unspecified: Secondary | ICD-10-CM

## 2015-11-21 DIAGNOSIS — M62830 Muscle spasm of back: Secondary | ICD-10-CM

## 2015-11-21 DIAGNOSIS — M546 Pain in thoracic spine: Secondary | ICD-10-CM | POA: Diagnosis not present

## 2015-11-21 DIAGNOSIS — R2681 Unsteadiness on feet: Secondary | ICD-10-CM

## 2015-11-21 DIAGNOSIS — G8929 Other chronic pain: Secondary | ICD-10-CM | POA: Diagnosis not present

## 2015-11-21 DIAGNOSIS — R2689 Other abnormalities of gait and mobility: Secondary | ICD-10-CM

## 2015-11-21 NOTE — Progress Notes (Signed)
Jose Fuentes was seen today in neurologic consultation at the request of WHITE,CYNTHIA S, MD.  The consultation is for the evaluation of "teeth chattering," "twitching of the eyes" and possible tics, which he had as a child.  The records that were made available to me were reviewed.  Pt states that it started about a week after he started sertraline.  States that he had blinking as a child and would move around in the chair/arms and he feels confident he had tourettes.  He states that those sx's were gone from the age of 75 y/o until 3-4 months ago.  He noted the teeth chattering more than anything, but thinks that the blinking started around the same time as the teeth.  He denies any vocalizations.  He states that he was "falling regularly" about a year ago but went to balance PT and it really helped.  He still thinks that he is "clumsy."  He reports that he has been on wellbutrin for 1.5 years.  States that he notes the "teeth chattering" most in the AM and diminishes some during the day.  If he juts out the jaw, he will note that he has more teeth chattering.    Neuroimaging has not previously been performed.    ALLERGIES:  No Known Allergies  CURRENT MEDICATIONS:  Outpatient Encounter Prescriptions as of 11/24/2015  Medication Sig  . aspirin EC 81 MG tablet Take 81 mg by mouth daily.  Marland Kitchen buPROPion (WELLBUTRIN XL) 150 MG 24 hr tablet Take 150 mg by mouth daily.  . cholecalciferol (VITAMIN D) 1000 UNITS tablet Take 1,000 Units by mouth 2 (two) times daily.  . hydrochlorothiazide (MICROZIDE) 12.5 MG capsule Take 1 capsule by mouth daily.  . sertraline (ZOLOFT) 100 MG tablet Take 100 mg by mouth daily.   . vitamin B-12 (CYANOCOBALAMIN) 1000 MCG tablet Take 1,000 mcg by mouth daily.  . [DISCONTINUED] aspirin 325 MG tablet Take 81 mg by mouth daily.   . [DISCONTINUED] buPROPion (WELLBUTRIN XL) 300 MG 24 hr tablet Take 1 tablet by mouth.  . [DISCONTINUED] atorvastatin (LIPITOR) 80 MG tablet  Take 1 tablet by mouth daily.  . [DISCONTINUED] diltiazem (CARDIZEM) 60 MG tablet Take 60 mg by mouth as needed.   . [DISCONTINUED] ZETIA 10 MG tablet Take 1 tablet by mouth daily. Reported on 05/26/2015   No facility-administered encounter medications on file as of 11/24/2015.     PAST MEDICAL HISTORY:   Past Medical History:  Diagnosis Date  . Atrial fibrillation ([place])   . Depression   . History of melanoma   . Hyperlipidemia   . Hypertension   . Peyronie disease     PAST SURGICAL HISTORY:   Past Surgical History:  Procedure Laterality Date  . KNEE SURGERY Left    meniscal tear  . MELANOMA EXCISION    . Thumb replacement Left   . TONSILLECTOMY      SOCIAL HISTORY:   Social History   Social History  . Marital status: Married    Spouse name: N/A  . Number of children: 0  . Years of education: N/A   Occupational History  . retired     professor of Cabin crew at [employer]  . Smoking status: Former Smoker    Quit date: 11/06/1968  . Smokeless tobacco: Never Used  . Alcohol use 0.0 oz/week     Comment: 3 drinks daily (wine)  . Drug use: No  . Sexual activity: Not on  file   Other Topics Concern  . Not on file   Social History Narrative   Lives with wife.  [employer] chem prof.     FAMILY HISTORY:   Family Status  Relation Status  . Father Deceased  . Mother Deceased  . Brother Alive  . Brother Alive  . Sister Deceased    ROS:  A complete 10 system review of systems was obtained and was unremarkable apart from what is mentioned above.  PHYSICAL EXAMINATION:    VITALS:   Vitals:   11/24/15 1249  BP: 120/80  Pulse: 84  Weight: 154 lb (69.9 kg)  Height: 5\' 7"  (1.702 m)    GEN:  Normal appears male in no acute distress.  Appears stated age. HEENT:  Normocephalic, atraumatic. The mucous membranes are moist. The superficial temporal arteries are without ropiness or tenderness. Cardiovascular: Regular rate and rhythm. Lungs: Clear  to auscultation bilaterally. Neck/Heme: There are no carotid bruits noted bilaterally.  NEUROLOGICAL: Orientation:  The patient is alert and oriented x 3.  Fund of knowledge is appropriate.  Recent and remote memory intact.  Attention span and concentration normal.  Repeats and names without difficulty. Cranial nerves: There is good facial symmetry. The pupils are equal round and reactive to light bilaterally. Fundoscopic exam is attempted but the disc margins are not well visualized bilaterally.  Extraocular muscles are intact and visual fields are full to confrontational testing. Speech is fluent and clear. Soft palate rises symmetrically and there is no tongue deviation. Hearing is intact to conversational tone. Tone: Tone is good throughout. Sensation: Sensation is intact to light touch and pinprick throughout (facial, trunk, extremities). Vibration is intact at the bilateral big toe. There is no extinction with double simultaneous stimulation. There is no sensory dermatomal level identified. Coordination:  The patient has no difficulty with RAM's or FNF bilaterally. Motor: Strength is 5/5 in the bilateral upper and lower extremities.  Shoulder shrug is equal and symmetric. There is no pronator drift.  There are no fasciculations noted. DTR's: Deep tendon reflexes are 2/4 at the bilateral biceps, triceps, brachioradialis, patella and achilles.  Plantar responses are downgoing bilaterally. Gait and Station: The patient is able to ambulate without difficulty. The patient is able to heel toe walk without any difficulty. The patient is able to ambulate in a tandem fashion. The patient is able to stand in the Romberg position.  Abnormal Movements:  There is blepharospasm.  I did not note any jaw tremor until he purposefully jutted out the mandible and then he could induce a "tremor."    No results found for: TSH   IMPRESSION/PLAN  1. Blepharospasm with complaints of jaw tremor (but true jaw tremor  was not noted on exam)  -He does have hx of this as child with hx of tics as a child.  He reports that all sx's were gone until he started zoloft.  Unfortunately, no family is here to corroborate his story.  Sometimes, patients will have blepharospasm and not realize it.  Nonetheless, he also told me that the risks of getting off of zoloft (states that he is temperamental off of the medication) outweigh the benefits of trying to get off of that, even if for a short period of time.  While I am not necessarily convinced that the symptoms are from the sertraline, he reports that they started after he started taking it.  My first recommendation would be to see if his sertraline can be changed to anything else.  This may require the assistance from psychiatry.    -We talked about using clonazepam, but even if this was true jaw tremor, it is generally very resistant to treatment with medication.  He really does not want any medication right now.  I would also be a little leery of clonazepam given the amount of alcohol he is taking in.  This really should be tapered.  -We will do an MRI of the brain just to make sure we are not missing anything.  I told him we will likely see cerebral small vessel disease and perhaps some brain atrophy as well.    -we will do the following labs: CBC, CMP, ceruloplasmin, copper, LFTs, TSH, PTH, ferritin, sedimentation rate, ANA, antiphospholipid antibody, lupus anticoagulant, RPR, B12, antigliadin antibody  -f/u in 4-6 weeks, sooner should new neuro issues arise.      Cc:  Vidal Schwalbe, MD

## 2015-11-21 NOTE — Therapy (Signed)
[place], [place], [address] Phone: [phone]   Fax:  [phone]  Physical Therapy Treatment  Patient Details  Name: Jose Fuentes MRN: [id number] Date of Birth: 08-06-1940 Referring Provider: Dr. Dema Severin  Encounter Date: 11/21/2015      PT End of Session - 11/21/15 1103    Visit Number 5   Date for PT Re-Evaluation 01/02/16   PT Start Time [id number]   PT Stop Time 1110   PT Time Calculation (min) 55 min   Activity Tolerance Patient tolerated treatment well;No increased pain   Behavior During Therapy WFL for tasks assessed/performed      Past Medical History:  Diagnosis Date  . Atrial fibrillation ([place])   . Hyperlipidemia   . Hypertension   . Peyronie disease     Past Surgical History:  Procedure Laterality Date  . KNEE SURGERY    . Thumb replacement    . TONSILLECTOMY      There were no vitals filed for this visit.      Subjective Assessment - 11/21/15 1016    Subjective Pt reports that he rode to [place] yesterday and states that he noticed the pain pretty soon after riding in the backseat. Patinet also states that he notices the pain when perfroming certain quick movements   Limitations Sitting;Lifting;House hold activities   How long can you sit comfortably? 1 hour   How long can you walk comfortably? 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 Yes   Pain Score 1    Pain Location Back   Pain Orientation Lower                         [facility] Adult PT Treatment/Exercise - 11/21/15 0001      Exercises   Exercises Lumbar;Knee/Hip     Lumbar Exercises: Aerobic   Stationary Bike NuStep 10 minutes     Lumbar Exercises: Machines for Strengthening   Cybex Knee  Extension 20lbs 3x20   Cybex Knee Flexion 35 lbs 3x20   Leg Press 30lbs x30     Lumbar Exercises: Prone   Other Prone Lumbar Exercises prone on elbows 2x2 minutes     Modalities   Modalities Traction     Traction   Type of Traction Lumbar   Min (lbs) 0   Max (lbs) 70   Time 15     Manual Therapy   Manual Therapy Passive ROM;Muscle Energy Technique   Manual therapy comments piriformis stretch   Muscle Energy Technique hip extension/ flexion on right                PT Education - 11/21/15 1102    Education provided Yes   Education Details piriformis stretch for home   Person(s) Educated Patient   Methods Explanation;Demonstration;Tactile cues;Verbal cues   Comprehension Verbalized understanding;Returned demonstration          PT Short Term Goals - 11/07/15 0959      PT SHORT TERM GOAL #1   Title Pt will demonstrate recall of HEP.           PT Long Term Goals - 11/17/15 1434      PT LONG TERM GOAL #1   Title Pt will be increase  hip flexion strength to 4+/5 bilaterally in order to improve functional abilities.   Status On-going     PT LONG TERM GOAL #2   Title Pt will report 0/10 pain while performing household activities such as gardening and woodshop work.   Status On-going     PT LONG TERM GOAL #3   Title Pt will be able to perform forwad flexion and Rt SB without any increase in symptoms in order to improve functional mobility.   Status On-going               Plan - 11/21/15 1103    Clinical Impression Statement Patient continues to tolerate tretament well with no increase in pain with the exercises however as soon as heside bends to the right or rotates the "wrog way" he begins to feel the sharp pain in the right lumbar region. Patient tried traction today and reports that "it actually feels good".   Rehab Potential Good   PT Frequency 2x / week   PT Duration 8 weeks   PT Treatment/Interventions ADLs/Self Care Home Management;Electrical  Stimulation;Moist Heat;Cryotherapy;Gait training;Stair training;Patient/family education;Balance training;Therapeutic exercise;Therapeutic activities;Manual techniques   PT Next Visit Plan Core stability in standing, balance/neuroreeducation, functional box carry, traction   PT Home Exercise Plan HS, gastroc stretch, pelvic tilts   Consulted and Agree with Plan of Care Patient      Patient will benefit from skilled therapeutic intervention in order to improve the following deficits and impairments:  Decreased activity tolerance, Decreased balance, Decreased coordination, Decreased strength, Pain, Improper body mechanics, Postural dysfunction, Impaired flexibility  Visit Diagnosis: Chronic right-sided low back pain without sciatica  Muscle spasm of back  Unsteadiness on feet  Pain in thoracic spine  Other abnormalities of gait and mobility  Muscle weakness (generalized)     Problem List Patient Active Problem List   Diagnosis Date Noted  . Atrial fibrillation ([place]) 06/11/2014    Ren[facility] 11/21/2015, 11:09 AM  [place] [address] [place], [place], [address] Phone: [phone]   Fax:  [phone]  Name: Jose Fuentes MRN: [id number] Date of Birth: 06-17-1940

## 2015-11-24 ENCOUNTER — Other Ambulatory Visit: Payer: Self-pay | Admitting: Neurology

## 2015-11-24 ENCOUNTER — Encounter: Payer: Self-pay | Admitting: Neurology

## 2015-11-24 ENCOUNTER — Ambulatory Visit: Payer: Medicare Other | Admitting: Physical Therapy

## 2015-11-24 ENCOUNTER — Other Ambulatory Visit: Payer: Medicare Other

## 2015-11-24 ENCOUNTER — Ambulatory Visit (INDEPENDENT_AMBULATORY_CARE_PROVIDER_SITE_OTHER): Payer: Medicare Other | Admitting: Neurology

## 2015-11-24 VITALS — BP 120/80 | HR 84 | Ht 67.0 in | Wt 154.0 lb

## 2015-11-24 DIAGNOSIS — G245 Blepharospasm: Secondary | ICD-10-CM

## 2015-11-24 DIAGNOSIS — R259 Unspecified abnormal involuntary movements: Secondary | ICD-10-CM

## 2015-11-24 DIAGNOSIS — R251 Tremor, unspecified: Secondary | ICD-10-CM

## 2015-11-24 DIAGNOSIS — D696 Thrombocytopenia, unspecified: Secondary | ICD-10-CM | POA: Diagnosis not present

## 2015-11-24 DIAGNOSIS — I4891 Unspecified atrial fibrillation: Secondary | ICD-10-CM | POA: Diagnosis not present

## 2015-11-24 DIAGNOSIS — R5383 Other fatigue: Secondary | ICD-10-CM | POA: Diagnosis not present

## 2015-11-24 LAB — CBC WITH DIFFERENTIAL/PLATELET
BASOS PCT: 0 %
Basophils Absolute: 0 cells/uL (ref 0–200)
EOS ABS: 48 {cells}/uL (ref 15–500)
Eosinophils Relative: 1 %
HEMATOCRIT: 44 % (ref 38.5–50.0)
HEMOGLOBIN: 15.1 g/dL (ref 13.2–17.1)
LYMPHS PCT: 23 %
Lymphs Abs: 1104 cells/uL (ref 850–3900)
MCH: 31.7 pg (ref 27.0–33.0)
MCHC: 34.3 g/dL (ref 32.0–36.0)
MCV: 92.4 fL (ref 80.0–100.0)
MONO ABS: 528 {cells}/uL (ref 200–950)
MPV: 9.8 fL (ref 7.5–12.5)
Monocytes Relative: 11 %
NEUTROS ABS: 3120 {cells}/uL (ref 1500–7800)
Neutrophils Relative %: 65 %
Platelets: 130 10*3/uL — ABNORMAL LOW (ref 140–400)
RBC: 4.76 MIL/uL (ref 4.20–5.80)
RDW: 14.7 % (ref 11.0–15.0)
WBC: 4.8 10*3/uL (ref 3.8–10.8)

## 2015-11-24 LAB — COMPREHENSIVE METABOLIC PANEL
ALK PHOS: 72 U/L (ref 40–115)
ALT: 14 U/L (ref 9–46)
AST: 18 U/L (ref 10–35)
Albumin: 4.3 g/dL (ref 3.6–5.1)
BUN: 14 mg/dL (ref 7–25)
CALCIUM: 9.3 mg/dL (ref 8.6–10.3)
CO2: 26 mmol/L (ref 20–31)
Chloride: 102 mmol/L (ref 98–110)
Creat: 0.88 mg/dL (ref 0.70–1.18)
Glucose, Bld: 86 mg/dL (ref 65–99)
POTASSIUM: 3.7 mmol/L (ref 3.5–5.3)
Sodium: 140 mmol/L (ref 135–146)
TOTAL PROTEIN: 6.6 g/dL (ref 6.1–8.1)
Total Bilirubin: 0.4 mg/dL (ref 0.2–1.2)

## 2015-11-24 LAB — HEPATIC FUNCTION PANEL
ALT: 14 U/L (ref 9–46)
AST: 18 U/L (ref 10–35)
Albumin: 4.3 g/dL (ref 3.6–5.1)
Alkaline Phosphatase: 72 U/L (ref 40–115)
BILIRUBIN DIRECT: 0.1 mg/dL (ref <=0.2)
BILIRUBIN INDIRECT: 0.3 mg/dL (ref 0.2–1.2)
TOTAL PROTEIN: 6.6 g/dL (ref 6.1–8.1)
Total Bilirubin: 0.4 mg/dL (ref 0.2–1.2)

## 2015-11-24 LAB — TSH: TSH: 1.78 mIU/L (ref 0.40–4.50)

## 2015-11-24 LAB — VITAMIN B12: VITAMIN B 12: 641 pg/mL (ref 200–1100)

## 2015-11-24 LAB — FERRITIN: Ferritin: 252 ng/mL (ref 20–380)

## 2015-11-24 NOTE — Patient Instructions (Signed)
1. We have sent a referral to  Imaging for your MRI and they will call you directly to schedule your appt. They are located at [address] If you need to contact them directly please call [phone].  2. Your provider has requested that you have labwork completed today. Please go to [facility] ([id number]) on the second floor of this building before leaving the office today. You do not need to check in. If you are not called within 15 minutes please check with the front desk.   

## 2015-11-25 LAB — SEDIMENTATION RATE: SED RATE: 4 mm/h (ref 0–20)

## 2015-11-25 LAB — RPR

## 2015-11-25 LAB — ANA: ANA: NEGATIVE

## 2015-11-25 LAB — PTH, INTACT AND CALCIUM
CALCIUM: 9.3 mg/dL (ref 8.6–10.3)
PTH: 33 pg/mL (ref 14–64)

## 2015-11-26 LAB — GLIADIN ANTIBODIES, SERUM
GLIADIN IGA: 3 U (ref <20)
Gliadin IgG: 1 Units (ref <20)

## 2015-11-26 LAB — CERULOPLASMIN: CERULOPLASMIN: 29 mg/dL (ref 18–36)

## 2015-11-26 LAB — COPPER, URINE - RANDOM OR 24 HOUR
COPPER / CREATININE RATIO: 17 ug/g{creat} (ref 0–49)
Copper, Ur: 9 ug/L
Creatinine(Crt),U: 0.53 g/L (ref 0.30–3.00)

## 2015-11-26 LAB — RFX DRVVT SCR W/RFLX CONF 1:1 MIX: DRVVT SCREEN: 32 s (ref <=45)

## 2015-11-26 LAB — LUPUS ANTICOAGULANT PANEL

## 2015-11-26 LAB — RFX PTT-LA W/RFX TO HEX PHASE CONF: PTT-LA Screen: 33 s (ref <=40)

## 2015-12-01 ENCOUNTER — Encounter: Payer: Self-pay | Admitting: Physical Therapy

## 2015-12-01 ENCOUNTER — Ambulatory Visit: Payer: Medicare Other | Admitting: Physical Therapy

## 2015-12-01 DIAGNOSIS — R2681 Unsteadiness on feet: Secondary | ICD-10-CM | POA: Diagnosis not present

## 2015-12-01 DIAGNOSIS — G8929 Other chronic pain: Secondary | ICD-10-CM | POA: Diagnosis not present

## 2015-12-01 DIAGNOSIS — M545 Low back pain: Secondary | ICD-10-CM | POA: Diagnosis not present

## 2015-12-01 DIAGNOSIS — E785 Hyperlipidemia, unspecified: Secondary | ICD-10-CM | POA: Diagnosis not present

## 2015-12-01 DIAGNOSIS — M546 Pain in thoracic spine: Secondary | ICD-10-CM | POA: Diagnosis not present

## 2015-12-01 DIAGNOSIS — I10 Essential (primary) hypertension: Secondary | ICD-10-CM | POA: Diagnosis not present

## 2015-12-01 DIAGNOSIS — M62830 Muscle spasm of back: Secondary | ICD-10-CM

## 2015-12-01 DIAGNOSIS — F324 Major depressive disorder, single episode, in partial remission: Secondary | ICD-10-CM | POA: Diagnosis not present

## 2015-12-01 DIAGNOSIS — M549 Dorsalgia, unspecified: Secondary | ICD-10-CM | POA: Diagnosis not present

## 2015-12-01 DIAGNOSIS — R2689 Other abnormalities of gait and mobility: Secondary | ICD-10-CM | POA: Diagnosis not present

## 2015-12-01 NOTE — Therapy (Signed)
[place], [place], [address] Phone: [phone]   Fax:  [phone]  Physical Therapy Treatment  Patient Details  Name: Jose Fuentes MRN: [id number] Date of Birth: [birth year] Referring Provider: Dr. Dema Severin  Encounter Date: 12/01/2015      PT End of Session - 12/01/15 1216    Visit Number 6   Date for PT Re-Evaluation 01/02/16   PT Start Time 1132   PT Stop Time 1230   PT Time Calculation (min) 58 min   Activity Tolerance Patient tolerated treatment well;No increased pain   Behavior During Therapy WFL for tasks assessed/performed      Past Medical History:  Diagnosis Date  . Atrial fibrillation ([place])   . Depression   . History of melanoma   . Hyperlipidemia   . Hypertension   . Peyronie disease     Past Surgical History:  Procedure Laterality Date  . KNEE SURGERY Left    meniscal tear  . MELANOMA EXCISION    . Thumb replacement Left   . TONSILLECTOMY      There were no vitals filed for this visit.      Subjective Assessment - 12/01/15 1135    Subjective Pt reports that he isn't feeling the catching as much, he reports doing som leaves this weekend   Currently in Pain? No/denies   Pain Score 0-No pain                         [facility] Adult PT Treatment/Exercise - 12/01/15 0001      Lumbar Exercises: Aerobic   Stationary Bike NuStep L6 6 min      Lumbar Exercises: Machines for Strengthening   Other Lumbar Machine Exercise Rows 25lb 2x15   Other Lumbar Machine Exercise Lat 35lb 2x15      Knee/Hip Exercises: Standing   Hip ADduction 2 sets;10 reps   Hip ADduction Limitations 5   Hip Extension Both;2 sets;10 reps;Knee straight   Extension Limitations 5lb     Knee/Hip Exercises: Seated   Sit to Sand 2 sets;without UE support;15 reps  9lb      Traction   Type of Traction Lumbar   Min (lbs) 0   Max (lbs) 70   Time 15     Manual Therapy   Manual Therapy Joint mobilization;Soft tissue mobilization   Joint Mobilization Grade 1-2 jt mob right lumbar facets (L1-3)   Soft tissue mobilization Lumbar paraspinal soft tissue mobilzations (right side)                  PT Short Term Goals - 12/01/15 1217      PT SHORT TERM GOAL #1   Title Pt will demonstrate recall of HEP.   Status Achieved     PT SHORT TERM GOAL #2   Title Patient ambulates and scans environment while maintaining path & pace. (Target Date: 06/25/2015)   Status Achieved     PT SHORT TERM GOAL #3   Title Patient able to stand tandem for >30 seconds safely to indicate improvement in balance. (Target Date: 06/25/2015)   Status Achieved           PT Long Term Goals - 12/01/15 1218      PT LONG TERM GOAL #1   Title Pt will be increase hip flexion strength to 4+/5 bilaterally in order to improve functional abilities.   Status On-going  PT LONG TERM GOAL #2   Title Pt will report 0/10 pain while performing household activities such as gardening and woodshop work.   Status On-going     PT LONG TERM GOAL #3   Title Pt will be able to perform forwad flexion and Rt SB without any increase in symptoms in order to improve functional mobility.   Status On-going               Plan - 12/01/15 1221    Clinical Impression Statement SPT assisted in treatment performing MT to lumbar spine. Pt tolerated all exercises well without increase in symptoms. Pt with some weakness with standing hip extensions.    Rehab Potential Good   PT Frequency 2x / week   PT Duration 8 weeks   PT Treatment/Interventions ADLs/Self Care Home Management;Electrical Stimulation;Moist Heat;Cryotherapy;Gait training;Stair training;Patient/family education;Balance training;Therapeutic exercise;Therapeutic activities;Manual techniques   PT Next Visit Plan assess Tx, Core stability in standing, balance/neuroreeducation, functional box carry, traction      Patient will benefit  from skilled therapeutic intervention in order to improve the following deficits and impairments:  Decreased activity tolerance, Decreased balance, Decreased coordination, Decreased strength, Pain, Improper body mechanics, Postural dysfunction, Impaired flexibility  Visit Diagnosis: Chronic right-sided low back pain without sciatica  Muscle spasm of back     Problem List Patient Active Problem List   Diagnosis Date Noted  . Atrial fibrillation ([place]) 06/11/2014    Scot Jun 12/01/2015, 12:24 PM  [place] [address] [place], [place], [address] Phone: [phone]   Fax:  [phone]  Name: Jose Fuentes MRN: [id number] Date of Birth: 11-19-1940

## 2015-12-02 ENCOUNTER — Telehealth: Payer: Self-pay | Admitting: Neurology

## 2015-12-02 LAB — CARDIOLIPIN ANTIBODY: Phospholipids: 289 mg/dL — ABNORMAL HIGH (ref 151–264)

## 2015-12-02 NOTE — Telephone Encounter (Signed)
-----   Message from [place], DO sent at 11/29/2015 12:38 PM EST ----- Jose Fuentes, let patient know that labs are normal

## 2015-12-02 NOTE — Telephone Encounter (Signed)
Patient already made aware and labs sent to PCP.

## 2015-12-02 NOTE — Telephone Encounter (Signed)
-----   Message from [place], DO sent at 12/02/2015 11:50 AM EST ----- Let pt know that labs looked okay except platelets just little low (and have been) and "phospholipids" ordered instead of antiphospholipid AB but no need to repeat

## 2015-12-02 NOTE — Telephone Encounter (Signed)
Lab results sent to Harlan Stains, MD.

## 2015-12-02 NOTE — Telephone Encounter (Signed)
LMOM making patient aware labs are normal and to call with any questions.

## 2015-12-02 NOTE — Telephone Encounter (Signed)
Elizabeth Glinsky [facility] 09/11/1940. He was requesting that his results be sent to his PCP office. Thank you

## 2015-12-05 ENCOUNTER — Ambulatory Visit: Payer: Medicare Other | Attending: Family Medicine | Admitting: Physical Therapy

## 2015-12-05 ENCOUNTER — Encounter: Payer: Self-pay | Admitting: Physical Therapy

## 2015-12-05 DIAGNOSIS — M545 Low back pain, unspecified: Secondary | ICD-10-CM

## 2015-12-05 DIAGNOSIS — M546 Pain in thoracic spine: Secondary | ICD-10-CM | POA: Insufficient documentation

## 2015-12-05 DIAGNOSIS — G8929 Other chronic pain: Secondary | ICD-10-CM | POA: Diagnosis not present

## 2015-12-05 DIAGNOSIS — M62830 Muscle spasm of back: Secondary | ICD-10-CM

## 2015-12-05 DIAGNOSIS — R2681 Unsteadiness on feet: Secondary | ICD-10-CM | POA: Diagnosis not present

## 2015-12-05 NOTE — Therapy (Signed)
[place], [place], [address] Phone: [phone]   Fax:  [phone]  Physical Therapy Treatment  Patient Details  Name: Jose Fuentes MRN: [id number] Date of Birth: 28-Aug-1940 Referring Provider: Dr. Dema Severin  Encounter Date: 12/05/2015      PT End of Session - 12/05/15 1011    Visit Number 7   Date for PT Re-Evaluation 01/02/16   PT Start Time 0930   PT Stop Time 1027   PT Time Calculation (min) 57 min   Activity Tolerance Patient tolerated treatment well;No increased pain   Behavior During Therapy WFL for tasks assessed/performed      Past Medical History:  Diagnosis Date  . Atrial fibrillation ([place])   . Depression   . History of melanoma   . Hyperlipidemia   . Hypertension   . Peyronie disease     Past Surgical History:  Procedure Laterality Date  . KNEE SURGERY Left    meniscal tear  . MELANOMA EXCISION    . Thumb replacement Left   . TONSILLECTOMY      There were no vitals filed for this visit.      Subjective Assessment - 12/05/15 0934    Subjective "See the problem is that I still do heavy lifting like hauling leaves and that irritates it. But I don't get that stabbing pain as often"   Currently in Pain? Yes   Pain Score 2    Pain Location Back   Pain Orientation Lower                         [facility] Adult PT Treatment/Exercise - 12/05/15 0001      Lumbar Exercises: Stretches   Passive Hamstring Stretch 3 reps;10 seconds   Double Knee to Chest Stretch 2 reps;10 seconds   Lower Trunk Rotation 3 reps;10 seconds     Lumbar Exercises: Aerobic   Stationary Bike NuStep L8 5 min    Elliptical I10 R5 x3 min      Lumbar Exercises: Machines for Strengthening   Leg Press 60lbs 2x15   Other Lumbar Machine Exercise Rows 35lb 3x10   Other Lumbar Machine Exercise Lat 35lb 2x15; seated high to low rows 45llb 2x15      Lumbar Exercises: Prone   Other  Prone Lumbar Exercises Palnks 3x30sec     Knee/Hip Exercises: Standing   Functional Squat 2 sets;10 reps  2nd set with a forward reach 4lb   Other Standing Knee Exercises Squats with trunt rotation holding blue ball 2x6      Traction   Type of Traction Lumbar   Min (lbs) 0   Max (lbs) 75   Time 15                  PT Short Term Goals - 12/01/15 1217      PT SHORT TERM GOAL #1   Title Pt will demonstrate recall of HEP.   Status Achieved     PT SHORT TERM GOAL #2   Title Patient ambulates and scans environment while maintaining path & pace. (Target Date: 06/25/2015)   Status Achieved     PT SHORT TERM GOAL #3   Title Patient able to stand tandem for >30 seconds safely to indicate improvement in balance. (Target Date: 06/25/2015)   Status Achieved           PT Long Term Goals - 12/01/15 1218  PT LONG TERM GOAL #1   Title Pt will be increase hip flexion strength to 4+/5 bilaterally in order to improve functional abilities.   Status On-going     PT LONG TERM GOAL #2   Title Pt will report 0/10 pain while performing household activities such as gardening and woodshop work.   Status On-going     PT LONG TERM GOAL #3   Title Pt will be able to perform forwad flexion and Rt SB without any increase in symptoms in order to improve functional mobility.   Status On-going               Plan - 12/05/15 1011    Clinical Impression Statement Pt does requires an demonstration verbal and tactile cures to perform a appropriate functional squat. Pt with no increase in symptoms with today's interventions. pt reports that he feels well worked out.    Rehab Potential Good   PT Frequency 2x / week   PT Duration 8 weeks   PT Treatment/Interventions ADLs/Self Care Home Management;Electrical Stimulation;Moist Heat;Cryotherapy;Gait training;Stair training;Patient/family education;Balance training;Therapeutic exercise;Therapeutic activities;Manual techniques   PT Next  Visit Plan Core stability in standing, balance/neuroreeducation, functional box carry, traction      Patient will benefit from skilled therapeutic intervention in order to improve the following deficits and impairments:  Decreased activity tolerance, Decreased balance, Decreased coordination, Decreased strength, Pain, Improper body mechanics, Postural dysfunction, Impaired flexibility  Visit Diagnosis: Chronic right-sided low back pain without sciatica  Muscle spasm of back  Unsteadiness on feet  Pain in thoracic spine     Problem List Patient Active Problem List   Diagnosis Date Noted  . Atrial fibrillation ([place]) 06/11/2014    Scot Jun, PTA 12/05/2015, 10:14 AM  [place] [address] [place], [place], [address] Phone: [phone]   Fax:  [phone]  Name: Jose Fuentes MRN: [id number] Date of Birth: [birth year]

## 2015-12-08 ENCOUNTER — Encounter: Payer: Self-pay | Admitting: Physical Therapy

## 2015-12-08 ENCOUNTER — Ambulatory Visit: Payer: Medicare Other | Admitting: Physical Therapy

## 2015-12-08 ENCOUNTER — Other Ambulatory Visit: Payer: Self-pay | Admitting: Neurology

## 2015-12-08 DIAGNOSIS — M545 Low back pain, unspecified: Secondary | ICD-10-CM

## 2015-12-08 DIAGNOSIS — Z77018 Contact with and (suspected) exposure to other hazardous metals: Secondary | ICD-10-CM

## 2015-12-08 DIAGNOSIS — M62830 Muscle spasm of back: Secondary | ICD-10-CM | POA: Diagnosis not present

## 2015-12-08 DIAGNOSIS — G8929 Other chronic pain: Secondary | ICD-10-CM

## 2015-12-08 DIAGNOSIS — R2681 Unsteadiness on feet: Secondary | ICD-10-CM | POA: Diagnosis not present

## 2015-12-08 DIAGNOSIS — M546 Pain in thoracic spine: Secondary | ICD-10-CM | POA: Diagnosis not present

## 2015-12-08 NOTE — Therapy (Signed)
[place], [place], [address] Phone: [phone]   Fax:  [phone]  Physical Therapy Treatment  Patient Details  Name: Jose Fuentes MRN: [id number] Date of Birth: Jan 08, 1940 Referring Provider: Dr. Dema Severin  Encounter Date: 12/08/2015      PT End of Session - 12/08/15 1345    Visit Number 8   Date for PT Re-Evaluation 01/02/16   PT Start Time 1300   PT Stop Time 1400   PT Time Calculation (min) 60 min   Activity Tolerance Patient tolerated treatment well;No increased pain   Behavior During Therapy WFL for tasks assessed/performed      Past Medical History:  Diagnosis Date  . Atrial fibrillation ([place])   . Depression   . History of melanoma   . Hyperlipidemia   . Hypertension   . Peyronie disease     Past Surgical History:  Procedure Laterality Date  . KNEE SURGERY Left    meniscal tear  . MELANOMA EXCISION    . Thumb replacement Left   . TONSILLECTOMY      There were no vitals filed for this visit.      Subjective Assessment - 12/08/15 1300    Subjective "The sharp stabbing pain is gone, If I bend a certain way I can feel it "   Currently in Pain? No/denies   Pain Score 0-No pain                         [facility] Adult PT Treatment/Exercise - 12/08/15 0001      Lumbar Exercises: Aerobic   Stationary Bike NuStep L8 6 min    Elliptical I10 R6 x4 min      Lumbar Exercises: Machines for Strengthening   Leg Press 60lbs 2x15   Other Lumbar Machine Exercise Rows 35lb 3x10   Other Lumbar Machine Exercise Lat 35lb 2x15; seated high to low rows 45llb 2x15      Lumbar Exercises: Standing   Other Standing Lumbar Exercises Standing diagonal trunk rotations blue ball x10 each   Other Standing Lumbar Exercises overhead sit to stane with cane 2x10      Lumbar Exercises: Prone   Other Prone Lumbar Exercises Palnks 45 sec, 60 sec x2      Traction   Type of  Traction Lumbar   Min (lbs) 0   Max (lbs) 75   Time 15                  PT Short Term Goals - 12/01/15 1217      PT SHORT TERM GOAL #1   Title Pt will demonstrate recall of HEP.   Status Achieved     PT SHORT TERM GOAL #2   Title Patient ambulates and scans environment while maintaining path & pace. (Target Date: 06/25/2015)   Status Achieved     PT SHORT TERM GOAL #3   Title Patient able to stand tandem for >30 seconds safely to indicate improvement in balance. (Target Date: 06/25/2015)   Status Achieved           PT Long Term Goals - 12/08/15 1346      PT LONG TERM GOAL #1   Title Pt will be increase hip flexion strength to 4+/5 bilaterally in order to improve functional abilities.   Status Partially Met     PT LONG TERM GOAL #2   Title Pt will report 0/10 pain  while performing household activities such as gardening and woodshop work.   Status Partially Met     PT LONG TERM GOAL #3   Title Pt will be able to perform forwad flexion and Rt SB without any increase in symptoms in order to improve functional mobility.   Status Partially Met               Plan - 12/08/15 1349    Clinical Impression Statement Pt does require cues to maintain good form with trunk rotations. He reports decrease pain and improved function overall. No reports no increase pain with today's activities.   Rehab Potential Good   PT Frequency 2x / week   PT Duration 8 weeks   PT Treatment/Interventions ADLs/Self Care Home Management;Electrical Stimulation;Moist Heat;Cryotherapy;Gait training;Stair training;Patient/family education;Balance training;Therapeutic exercise;Therapeutic activities;Manual techniques   PT Next Visit Plan Core stability in standing, balance/neuroreeducation, functional box carry, traction, D/C within next 2 visits      Patient will benefit from skilled therapeutic intervention in order to improve the following deficits and impairments:  Decreased activity  tolerance, Decreased balance, Decreased coordination, Decreased strength, Pain, Improper body mechanics, Postural dysfunction, Impaired flexibility  Visit Diagnosis: Chronic right-sided low back pain without sciatica  Muscle spasm of back  Unsteadiness on feet  Pain in thoracic spine     Problem List Patient Active Problem List   Diagnosis Date Noted  . Atrial fibrillation ([place]) 06/11/2014    Scot Jun, PTA 12/08/2015, 1:55 PM  [place] [address] [place], [place], [address] Phone: [phone]   Fax:  [phone]  Name: Jose Fuentes MRN: [id number] Date of Birth: Jun 05, 1940

## 2015-12-12 ENCOUNTER — Ambulatory Visit: Payer: Medicare Other | Admitting: Physical Therapy

## 2015-12-12 ENCOUNTER — Encounter: Payer: Self-pay | Admitting: Physical Therapy

## 2015-12-12 DIAGNOSIS — M62830 Muscle spasm of back: Secondary | ICD-10-CM | POA: Diagnosis not present

## 2015-12-12 DIAGNOSIS — M546 Pain in thoracic spine: Secondary | ICD-10-CM | POA: Diagnosis not present

## 2015-12-12 DIAGNOSIS — G8929 Other chronic pain: Secondary | ICD-10-CM

## 2015-12-12 DIAGNOSIS — R2681 Unsteadiness on feet: Secondary | ICD-10-CM | POA: Diagnosis not present

## 2015-12-12 DIAGNOSIS — M545 Low back pain, unspecified: Secondary | ICD-10-CM

## 2015-12-12 NOTE — Therapy (Signed)
[place], [place], [address] Phone: [phone]   Fax:  [phone]  Physical Therapy Treatment  Patient Details  Name: Jose Fuentes MRN: [id number] Date of Birth: 1940/04/15 Referring Provider: Dr. Dema Severin  Encounter Date: 12/12/2015      PT End of Session - 12/12/15 0915    Visit Number 9   Date for PT Re-Evaluation 01/02/16   PT Start Time 0845   PT Stop Time 0925   PT Time Calculation (min) 40 min      Past Medical History:  Diagnosis Date  . Atrial fibrillation ([place])   . Depression   . History of melanoma   . Hyperlipidemia   . Hypertension   . Peyronie disease     Past Surgical History:  Procedure Laterality Date  . KNEE SURGERY Left    meniscal tear  . MELANOMA EXCISION    . Thumb replacement Left   . TONSILLECTOMY      There were no vitals filed for this visit.      Subjective Assessment - 12/12/15 0846    Subjective ready to graduate today, 70% better   Currently in Pain? No/denies                         [facility] Adult PT Treatment/Exercise - 12/12/15 0001      Lumbar Exercises: Aerobic   Stationary Bike NuStep L8 6 min      Lumbar Exercises: Machines for Strengthening   Leg Press 60lbs 2x15   Other Lumbar Machine Exercise Rows 35lb 3x10   Other Lumbar Machine Exercise Lat 35lb 2x15     Lumbar Exercises: Standing   Scapular Retraction Both;15 reps  25# row   Other Standing Lumbar Exercises upright row 25# 2 sets 10   Other Standing Lumbar Exercises 20# trunk rotation 15 times each way                PT Education - 12/12/15 0854    Education provided Yes   Education Details scap stab green tband   Person(s) Educated Patient   Methods Explanation;Demonstration;Handout   Comprehension Verbalized understanding;Returned demonstration          PT Short Term Goals - 12/01/15 1217      PT SHORT TERM GOAL #1   Title Pt will  demonstrate recall of HEP.   Status Achieved     PT SHORT TERM GOAL #2   Title Patient ambulates and scans environment while maintaining path & pace. (Target Date: 06/25/2015)   Status Achieved     PT SHORT TERM GOAL #3   Title Patient able to stand tandem for >30 seconds safely to indicate improvement in balance. (Target Date: 06/25/2015)   Status Achieved           PT Long Term Goals - 12/12/15 0915      PT LONG TERM GOAL #1   Title Pt will be increase hip flexion strength to 4+/5 bilaterally in order to improve functional abilities.   Status Achieved     PT LONG TERM GOAL #2   Title Pt will report 0/10 pain while performing household activities such as gardening and woodshop work.   Status Achieved     PT LONG TERM GOAL #3   Title Pt will be able to perform forwad flexion and Rt SB without any increase in symptoms in order to improve functional mobility.   Status  Achieved     PT LONG TERM GOAL #4   Title Patient is independent in ongoing HEP / fitness plan. (Target Date: 07/25/2015)               Plan - 12/12/15 7654    Clinical Impression Statement Goals met. HEP met. D/C    PT Next Visit Plan D/C      Patient will benefit from skilled therapeutic intervention in order to improve the following deficits and impairments:  Decreased activity tolerance, Decreased balance, Decreased coordination, Decreased strength, Pain, Improper body mechanics, Postural dysfunction, Impaired flexibility  Visit Diagnosis: Chronic right-sided low back pain without sciatica  Muscle spasm of back     Problem List Patient Active Problem List   Diagnosis Date Noted  . Atrial fibrillation ([place]) 06/11/2014    PAYSEUR,ANGIE PTA 12/12/2015, 9:21 AM  [place] [address] [place] [address] [place], [place], [address] Phone: [phone]   Fax:  [phone]  Name: Jose Fuentes MRN: [id number] Date of Birth: Oct 31, 1940

## 2015-12-15 ENCOUNTER — Ambulatory Visit
Admission: RE | Admit: 2015-12-15 | Discharge: 2015-12-15 | Disposition: A | Discharge status: Home / Self Care | Payer: Medicare Other | Source: Ambulatory Visit | Attending: Neurology | Admitting: Neurology

## 2015-12-15 ENCOUNTER — Other Ambulatory Visit: Payer: Medicare Other

## 2015-12-15 ENCOUNTER — Ambulatory Visit: Payer: Medicare Other | Admitting: Physical Therapy

## 2015-12-15 DIAGNOSIS — Z77018 Contact with and (suspected) exposure to other hazardous metals: Secondary | ICD-10-CM

## 2015-12-15 DIAGNOSIS — Z01818 Encounter for other preprocedural examination: Secondary | ICD-10-CM | POA: Diagnosis not present

## 2015-12-15 DIAGNOSIS — G245 Blepharospasm: Secondary | ICD-10-CM

## 2015-12-15 DIAGNOSIS — R259 Unspecified abnormal involuntary movements: Secondary | ICD-10-CM

## 2015-12-15 DIAGNOSIS — R251 Tremor, unspecified: Secondary | ICD-10-CM

## 2015-12-16 ENCOUNTER — Telehealth: Payer: Self-pay | Admitting: Neurology

## 2015-12-16 NOTE — Telephone Encounter (Signed)
Left message on machine for patient to call back.

## 2015-12-16 NOTE — Telephone Encounter (Signed)
-----   Message from [place], DO sent at 12/16/2015  7:31 AM EST ----- Scattered T2 hyperintensities noted, including small one in R pons (only seen on T2 weighted image and didn't see on FLAIR).  Jose Fuentes, let pt know that MRI just showed some mild hardening of arteries in brain but nothing causing sx's and was fairly unremarkable.

## 2015-12-16 NOTE — Telephone Encounter (Signed)
Patient made aware of results.  

## 2015-12-19 ENCOUNTER — Ambulatory Visit: Payer: Medicare Other | Admitting: Physical Therapy

## 2016-01-19 DIAGNOSIS — F324 Major depressive disorder, single episode, in partial remission: Secondary | ICD-10-CM | POA: Diagnosis not present

## 2016-01-19 DIAGNOSIS — E785 Hyperlipidemia, unspecified: Secondary | ICD-10-CM | POA: Diagnosis not present

## 2016-03-29 DIAGNOSIS — L821 Other seborrheic keratosis: Secondary | ICD-10-CM | POA: Diagnosis not present

## 2016-03-29 DIAGNOSIS — L812 Freckles: Secondary | ICD-10-CM | POA: Diagnosis not present

## 2016-03-29 DIAGNOSIS — D225 Melanocytic nevi of trunk: Secondary | ICD-10-CM | POA: Diagnosis not present

## 2016-03-29 DIAGNOSIS — D1801 Hemangioma of skin and subcutaneous tissue: Secondary | ICD-10-CM | POA: Diagnosis not present

## 2016-03-29 DIAGNOSIS — D2271 Melanocytic nevi of right lower limb, including hip: Secondary | ICD-10-CM | POA: Diagnosis not present

## 2016-03-29 DIAGNOSIS — D2272 Melanocytic nevi of left lower limb, including hip: Secondary | ICD-10-CM | POA: Diagnosis not present

## 2016-03-29 DIAGNOSIS — Z85820 Personal history of malignant melanoma of skin: Secondary | ICD-10-CM | POA: Diagnosis not present

## 2016-03-29 DIAGNOSIS — L570 Actinic keratosis: Secondary | ICD-10-CM | POA: Diagnosis not present

## 2016-05-17 DIAGNOSIS — E785 Hyperlipidemia, unspecified: Secondary | ICD-10-CM | POA: Diagnosis not present

## 2016-05-17 DIAGNOSIS — F325 Major depressive disorder, single episode, in full remission: Secondary | ICD-10-CM | POA: Diagnosis not present

## 2016-05-17 DIAGNOSIS — I4891 Unspecified atrial fibrillation: Secondary | ICD-10-CM | POA: Diagnosis not present

## 2016-05-17 DIAGNOSIS — I10 Essential (primary) hypertension: Secondary | ICD-10-CM | POA: Diagnosis not present

## 2016-05-17 DIAGNOSIS — Z23 Encounter for immunization: Secondary | ICD-10-CM | POA: Diagnosis not present

## 2016-05-17 DIAGNOSIS — S8012XA Contusion of left lower leg, initial encounter: Secondary | ICD-10-CM | POA: Diagnosis not present

## 2016-08-30 DIAGNOSIS — H2513 Age-related nuclear cataract, bilateral: Secondary | ICD-10-CM | POA: Diagnosis not present

## 2016-08-30 DIAGNOSIS — H5213 Myopia, bilateral: Secondary | ICD-10-CM | POA: Diagnosis not present

## 2016-10-04 DIAGNOSIS — Z23 Encounter for immunization: Secondary | ICD-10-CM | POA: Diagnosis not present

## 2016-10-04 DIAGNOSIS — L821 Other seborrheic keratosis: Secondary | ICD-10-CM | POA: Diagnosis not present

## 2016-10-04 DIAGNOSIS — D2271 Melanocytic nevi of right lower limb, including hip: Secondary | ICD-10-CM | POA: Diagnosis not present

## 2016-10-04 DIAGNOSIS — Z85820 Personal history of malignant melanoma of skin: Secondary | ICD-10-CM | POA: Diagnosis not present

## 2016-10-04 DIAGNOSIS — D1801 Hemangioma of skin and subcutaneous tissue: Secondary | ICD-10-CM | POA: Diagnosis not present

## 2016-10-04 DIAGNOSIS — D2272 Melanocytic nevi of left lower limb, including hip: Secondary | ICD-10-CM | POA: Diagnosis not present

## 2016-10-04 DIAGNOSIS — L812 Freckles: Secondary | ICD-10-CM | POA: Diagnosis not present

## 2016-11-12 DIAGNOSIS — I4891 Unspecified atrial fibrillation: Secondary | ICD-10-CM | POA: Diagnosis not present

## 2016-11-12 DIAGNOSIS — F339 Major depressive disorder, recurrent, unspecified: Secondary | ICD-10-CM | POA: Diagnosis not present

## 2016-11-12 DIAGNOSIS — Z136 Encounter for screening for cardiovascular disorders: Secondary | ICD-10-CM | POA: Diagnosis not present

## 2016-11-12 DIAGNOSIS — I10 Essential (primary) hypertension: Secondary | ICD-10-CM | POA: Diagnosis not present

## 2016-11-12 DIAGNOSIS — Z0000 Encounter for general adult medical examination without abnormal findings: Secondary | ICD-10-CM | POA: Diagnosis not present

## 2016-11-12 DIAGNOSIS — E785 Hyperlipidemia, unspecified: Secondary | ICD-10-CM | POA: Diagnosis not present

## 2016-11-12 DIAGNOSIS — D696 Thrombocytopenia, unspecified: Secondary | ICD-10-CM | POA: Diagnosis not present

## 2016-11-12 DIAGNOSIS — R7301 Impaired fasting glucose: Secondary | ICD-10-CM | POA: Diagnosis not present

## 2017-03-18 DIAGNOSIS — F331 Major depressive disorder, recurrent, moderate: Secondary | ICD-10-CM | POA: Diagnosis not present

## 2017-04-04 DIAGNOSIS — Z85820 Personal history of malignant melanoma of skin: Secondary | ICD-10-CM | POA: Diagnosis not present

## 2017-04-04 DIAGNOSIS — L570 Actinic keratosis: Secondary | ICD-10-CM | POA: Diagnosis not present

## 2017-04-04 DIAGNOSIS — M6740 Ganglion, unspecified site: Secondary | ICD-10-CM | POA: Diagnosis not present

## 2017-04-04 DIAGNOSIS — D1801 Hemangioma of skin and subcutaneous tissue: Secondary | ICD-10-CM | POA: Diagnosis not present

## 2017-04-04 DIAGNOSIS — D2272 Melanocytic nevi of left lower limb, including hip: Secondary | ICD-10-CM | POA: Diagnosis not present

## 2017-04-04 DIAGNOSIS — D225 Melanocytic nevi of trunk: Secondary | ICD-10-CM | POA: Diagnosis not present

## 2017-04-04 DIAGNOSIS — L821 Other seborrheic keratosis: Secondary | ICD-10-CM | POA: Diagnosis not present

## 2017-04-19 DIAGNOSIS — F331 Major depressive disorder, recurrent, moderate: Secondary | ICD-10-CM | POA: Diagnosis not present

## 2017-05-31 DIAGNOSIS — F331 Major depressive disorder, recurrent, moderate: Secondary | ICD-10-CM | POA: Diagnosis not present

## 2017-06-06 DIAGNOSIS — R1310 Dysphagia, unspecified: Secondary | ICD-10-CM | POA: Diagnosis not present

## 2017-06-27 DIAGNOSIS — M67432 Ganglion, left wrist: Secondary | ICD-10-CM | POA: Diagnosis not present

## 2017-06-27 DIAGNOSIS — M79642 Pain in left hand: Secondary | ICD-10-CM | POA: Diagnosis not present

## 2017-06-27 DIAGNOSIS — M67442 Ganglion, left hand: Secondary | ICD-10-CM | POA: Diagnosis not present

## 2017-07-18 DIAGNOSIS — J029 Acute pharyngitis, unspecified: Secondary | ICD-10-CM | POA: Diagnosis not present

## 2017-07-25 ENCOUNTER — Other Ambulatory Visit: Payer: Self-pay | Admitting: Gastroenterology

## 2017-07-25 DIAGNOSIS — R1310 Dysphagia, unspecified: Secondary | ICD-10-CM

## 2017-07-29 ENCOUNTER — Ambulatory Visit
Admission: RE | Admit: 2017-07-29 | Discharge: 2017-07-29 | Disposition: A | Discharge status: Home / Self Care | Payer: Medicare Other | Source: Ambulatory Visit | Attending: Gastroenterology | Admitting: Gastroenterology

## 2017-07-29 DIAGNOSIS — R1310 Dysphagia, unspecified: Secondary | ICD-10-CM | POA: Diagnosis not present

## 2017-08-05 DIAGNOSIS — R2689 Other abnormalities of gait and mobility: Secondary | ICD-10-CM | POA: Diagnosis not present

## 2017-08-05 DIAGNOSIS — K219 Gastro-esophageal reflux disease without esophagitis: Secondary | ICD-10-CM | POA: Diagnosis not present

## 2017-08-05 DIAGNOSIS — J029 Acute pharyngitis, unspecified: Secondary | ICD-10-CM | POA: Diagnosis not present

## 2017-08-08 DIAGNOSIS — K317 Polyp of stomach and duodenum: Secondary | ICD-10-CM | POA: Diagnosis not present

## 2017-08-08 DIAGNOSIS — K2950 Unspecified chronic gastritis without bleeding: Secondary | ICD-10-CM | POA: Diagnosis not present

## 2017-08-08 DIAGNOSIS — D490 Neoplasm of unspecified behavior of digestive system: Secondary | ICD-10-CM | POA: Diagnosis not present

## 2017-08-08 DIAGNOSIS — R1310 Dysphagia, unspecified: Secondary | ICD-10-CM | POA: Diagnosis not present

## 2017-08-09 DIAGNOSIS — F331 Major depressive disorder, recurrent, moderate: Secondary | ICD-10-CM | POA: Diagnosis not present

## 2017-08-11 DIAGNOSIS — R1310 Dysphagia, unspecified: Secondary | ICD-10-CM | POA: Diagnosis not present

## 2017-08-11 DIAGNOSIS — D490 Neoplasm of unspecified behavior of digestive system: Secondary | ICD-10-CM | POA: Diagnosis not present

## 2017-08-11 DIAGNOSIS — K2950 Unspecified chronic gastritis without bleeding: Secondary | ICD-10-CM | POA: Diagnosis not present

## 2017-08-26 ENCOUNTER — Encounter: Payer: Self-pay | Admitting: Cardiology

## 2017-08-26 DIAGNOSIS — H349 Unspecified retinal vascular occlusion: Secondary | ICD-10-CM | POA: Diagnosis not present

## 2017-08-26 DIAGNOSIS — H2513 Age-related nuclear cataract, bilateral: Secondary | ICD-10-CM | POA: Diagnosis not present

## 2017-08-26 DIAGNOSIS — H52203 Unspecified astigmatism, bilateral: Secondary | ICD-10-CM | POA: Diagnosis not present

## 2017-09-12 ENCOUNTER — Other Ambulatory Visit (HOSPITAL_COMMUNITY): Payer: Self-pay | Admitting: Ophthalmology

## 2017-09-12 DIAGNOSIS — H349 Unspecified retinal vascular occlusion: Secondary | ICD-10-CM

## 2017-09-16 ENCOUNTER — Ambulatory Visit (HOSPITAL_COMMUNITY)
Admission: RE | Admit: 2017-09-16 | Discharge: 2017-09-16 | Disposition: A | Discharge status: Home / Self Care | Payer: Medicare Other | Source: Ambulatory Visit | Attending: Cardiovascular Disease | Admitting: Cardiovascular Disease

## 2017-09-16 DIAGNOSIS — H349 Unspecified retinal vascular occlusion: Secondary | ICD-10-CM | POA: Diagnosis not present

## 2017-09-27 DIAGNOSIS — Z23 Encounter for immunization: Secondary | ICD-10-CM | POA: Diagnosis not present

## 2017-10-07 DIAGNOSIS — D1801 Hemangioma of skin and subcutaneous tissue: Secondary | ICD-10-CM | POA: Diagnosis not present

## 2017-10-07 DIAGNOSIS — L821 Other seborrheic keratosis: Secondary | ICD-10-CM | POA: Diagnosis not present

## 2017-10-07 DIAGNOSIS — Z85820 Personal history of malignant melanoma of skin: Secondary | ICD-10-CM | POA: Diagnosis not present

## 2017-10-07 DIAGNOSIS — L814 Other melanin hyperpigmentation: Secondary | ICD-10-CM | POA: Diagnosis not present

## 2017-10-07 DIAGNOSIS — D485 Neoplasm of uncertain behavior of skin: Secondary | ICD-10-CM | POA: Diagnosis not present

## 2017-10-07 DIAGNOSIS — L812 Freckles: Secondary | ICD-10-CM | POA: Diagnosis not present

## 2017-10-07 DIAGNOSIS — D225 Melanocytic nevi of trunk: Secondary | ICD-10-CM | POA: Diagnosis not present

## 2017-10-10 DIAGNOSIS — F331 Major depressive disorder, recurrent, moderate: Secondary | ICD-10-CM | POA: Diagnosis not present

## 2017-11-07 DIAGNOSIS — F331 Major depressive disorder, recurrent, moderate: Secondary | ICD-10-CM | POA: Diagnosis not present

## 2017-11-22 DIAGNOSIS — F331 Major depressive disorder, recurrent, moderate: Secondary | ICD-10-CM | POA: Diagnosis not present

## 2017-11-29 DIAGNOSIS — F331 Major depressive disorder, recurrent, moderate: Secondary | ICD-10-CM | POA: Diagnosis not present

## 2017-12-13 DIAGNOSIS — M1990 Unspecified osteoarthritis, unspecified site: Secondary | ICD-10-CM | POA: Diagnosis not present

## 2017-12-13 DIAGNOSIS — K219 Gastro-esophageal reflux disease without esophagitis: Secondary | ICD-10-CM | POA: Diagnosis not present

## 2017-12-13 DIAGNOSIS — Z87891 Personal history of nicotine dependence: Secondary | ICD-10-CM | POA: Diagnosis not present

## 2017-12-13 DIAGNOSIS — R12 Heartburn: Secondary | ICD-10-CM | POA: Diagnosis not present

## 2017-12-13 DIAGNOSIS — K228 Other specified diseases of esophagus: Secondary | ICD-10-CM | POA: Diagnosis not present

## 2017-12-13 DIAGNOSIS — D130 Benign neoplasm of esophagus: Secondary | ICD-10-CM | POA: Diagnosis not present

## 2017-12-13 DIAGNOSIS — Q398 Other congenital malformations of esophagus: Secondary | ICD-10-CM | POA: Diagnosis not present

## 2017-12-13 DIAGNOSIS — Q402 Other specified congenital malformations of stomach: Secondary | ICD-10-CM | POA: Diagnosis not present

## 2017-12-13 DIAGNOSIS — I10 Essential (primary) hypertension: Secondary | ICD-10-CM | POA: Diagnosis not present

## 2017-12-13 DIAGNOSIS — Z79899 Other long term (current) drug therapy: Secondary | ICD-10-CM | POA: Diagnosis not present

## 2017-12-13 DIAGNOSIS — E785 Hyperlipidemia, unspecified: Secondary | ICD-10-CM | POA: Diagnosis not present

## 2018-03-03 DIAGNOSIS — H349 Unspecified retinal vascular occlusion: Secondary | ICD-10-CM | POA: Diagnosis not present

## 2018-03-03 DIAGNOSIS — H2513 Age-related nuclear cataract, bilateral: Secondary | ICD-10-CM | POA: Diagnosis not present

## 2018-03-10 DIAGNOSIS — F331 Major depressive disorder, recurrent, moderate: Secondary | ICD-10-CM | POA: Diagnosis not present

## 2018-04-17 DIAGNOSIS — F3341 Major depressive disorder, recurrent, in partial remission: Secondary | ICD-10-CM | POA: Diagnosis not present

## 2018-05-05 DIAGNOSIS — H2511 Age-related nuclear cataract, right eye: Secondary | ICD-10-CM | POA: Diagnosis not present

## 2018-05-05 DIAGNOSIS — H25811 Combined forms of age-related cataract, right eye: Secondary | ICD-10-CM | POA: Diagnosis not present

## 2018-05-18 DIAGNOSIS — H25812 Combined forms of age-related cataract, left eye: Secondary | ICD-10-CM | POA: Diagnosis not present

## 2018-05-18 DIAGNOSIS — H2512 Age-related nuclear cataract, left eye: Secondary | ICD-10-CM | POA: Diagnosis not present

## 2018-06-16 DIAGNOSIS — H349 Unspecified retinal vascular occlusion: Secondary | ICD-10-CM | POA: Diagnosis not present

## 2018-06-21 ENCOUNTER — Other Ambulatory Visit: Payer: Self-pay

## 2018-06-21 ENCOUNTER — Ambulatory Visit
Admission: RE | Admit: 2018-06-21 | Discharge: 2018-06-21 | Disposition: A | Discharge status: Home / Self Care | Payer: Medicare Other | Source: Ambulatory Visit | Attending: Family Medicine | Admitting: Family Medicine

## 2018-06-21 ENCOUNTER — Other Ambulatory Visit: Payer: Self-pay | Admitting: Family Medicine

## 2018-06-21 DIAGNOSIS — W19XXXA Unspecified fall, initial encounter: Secondary | ICD-10-CM | POA: Diagnosis not present

## 2018-06-21 DIAGNOSIS — H532 Diplopia: Secondary | ICD-10-CM | POA: Diagnosis not present

## 2018-06-21 DIAGNOSIS — S0990XA Unspecified injury of head, initial encounter: Secondary | ICD-10-CM | POA: Diagnosis not present

## 2018-06-21 DIAGNOSIS — S060X0A Concussion without loss of consciousness, initial encounter: Secondary | ICD-10-CM | POA: Diagnosis not present

## 2018-06-21 DIAGNOSIS — R51 Headache: Secondary | ICD-10-CM | POA: Diagnosis not present

## 2018-06-21 DIAGNOSIS — R42 Dizziness and giddiness: Secondary | ICD-10-CM | POA: Diagnosis not present

## 2018-08-14 IMAGING — CR DG CHEST 2V
2 series · 2 of 2 positions shown · non-contrast
Comparison: September 12, 2014

CLINICAL DATA: Chronic chest pain

EXAM:
CHEST  2 VIEW

[w chest pa]
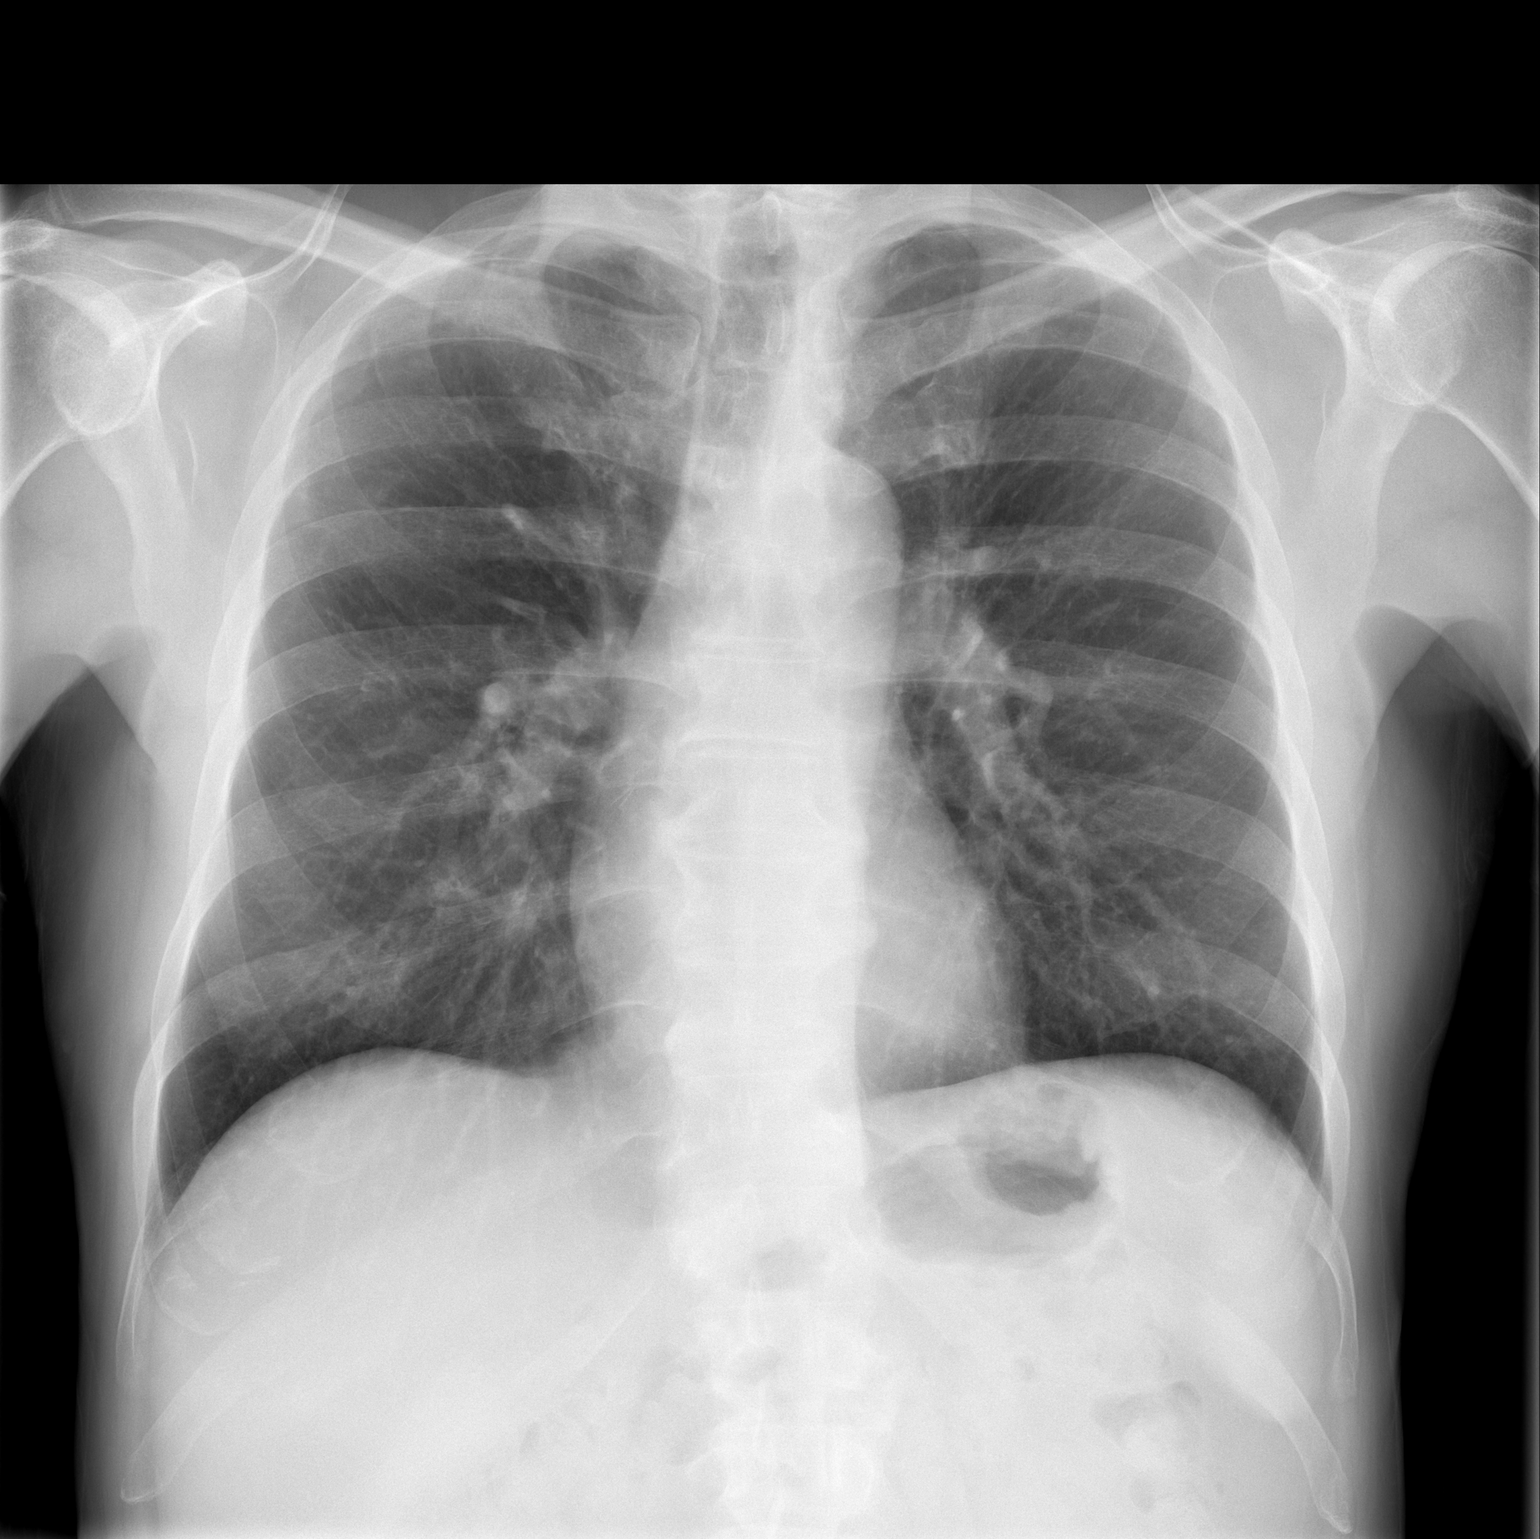

[w chest lat]
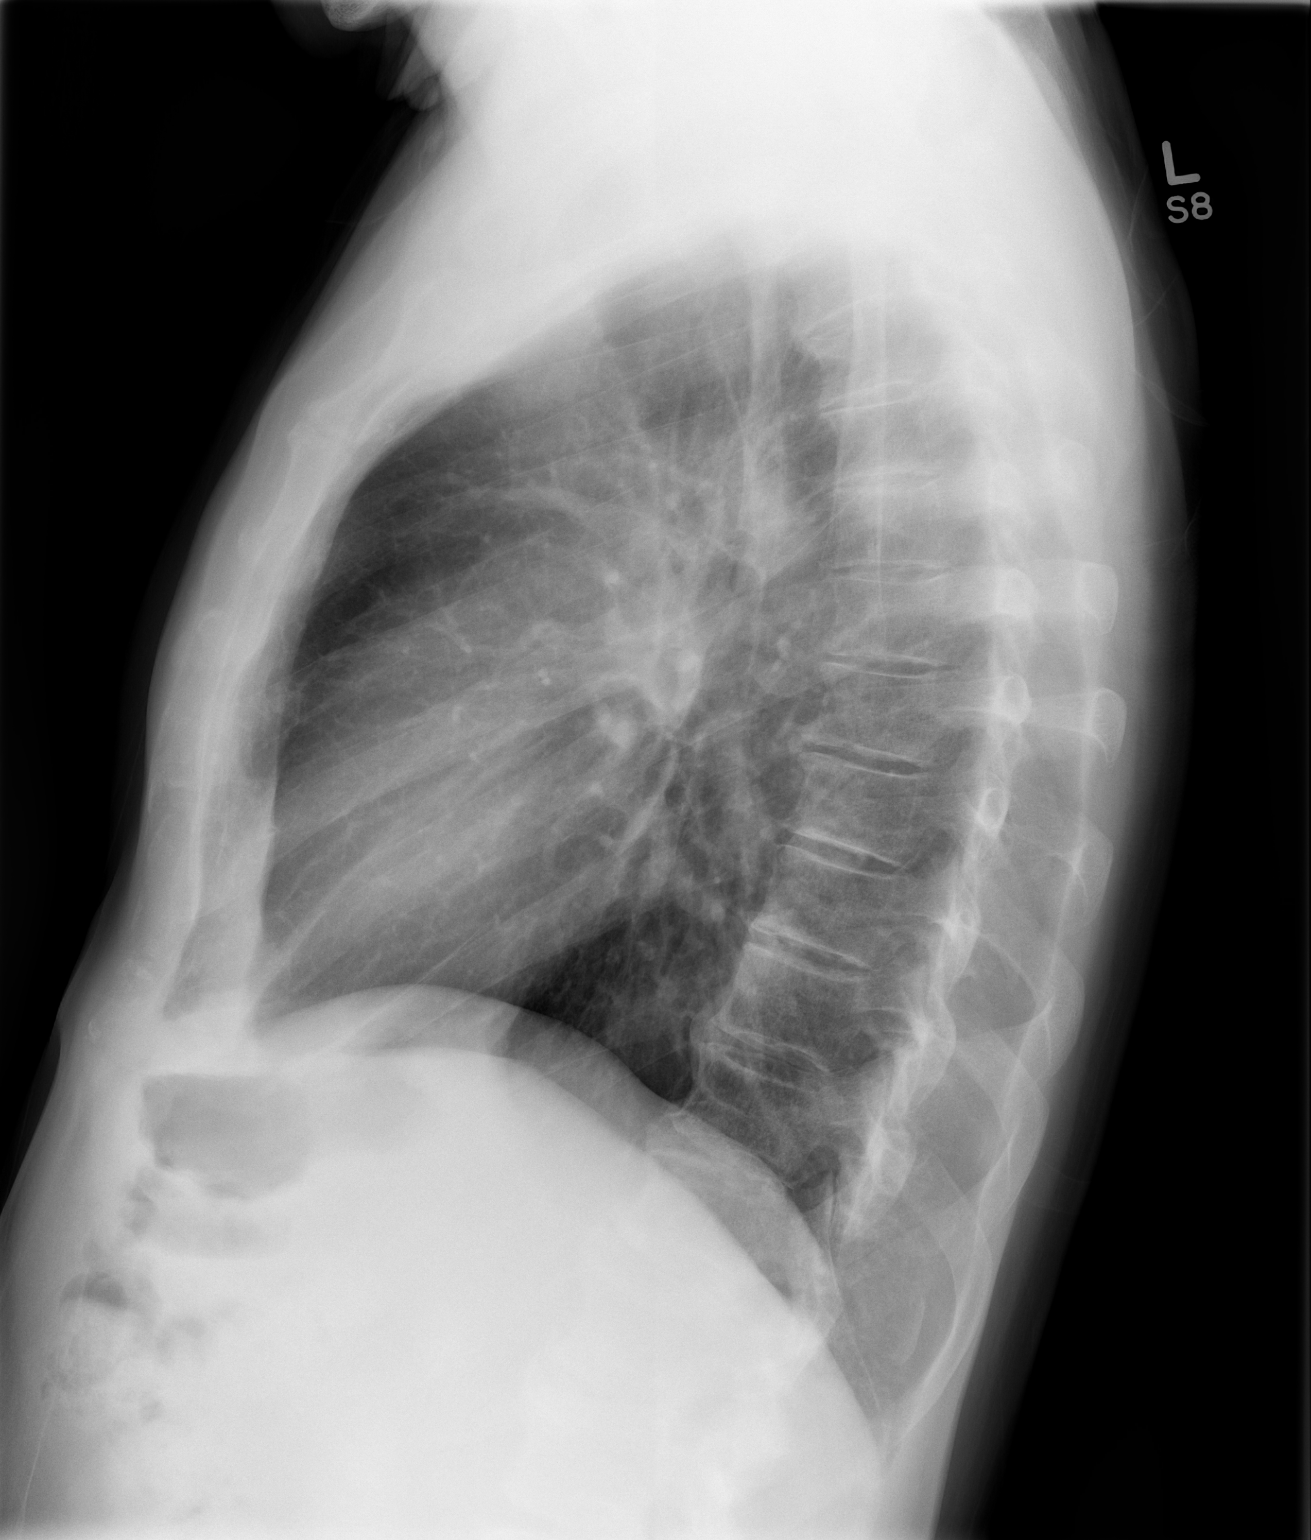

[2 of 2 positions shown; findings below may reference images not displayed]

FINDINGS: Lungs are clear. Heart size and pulmonary vascularity are normal. No
adenopathy. There is degenerative change in the thoracic spine. No
pneumothorax.
IMPRESSION: No edema or consolidation.

## 2018-09-25 DIAGNOSIS — Z23 Encounter for immunization: Secondary | ICD-10-CM | POA: Diagnosis not present

## 2018-10-13 DIAGNOSIS — H26492 Other secondary cataract, left eye: Secondary | ICD-10-CM | POA: Diagnosis not present

## 2018-10-13 DIAGNOSIS — H349 Unspecified retinal vascular occlusion: Secondary | ICD-10-CM | POA: Diagnosis not present

## 2018-11-09 DIAGNOSIS — H26492 Other secondary cataract, left eye: Secondary | ICD-10-CM | POA: Diagnosis not present

## 2018-12-21 ENCOUNTER — Emergency Department (HOSPITAL_BASED_OUTPATIENT_CLINIC_OR_DEPARTMENT_OTHER)
Admission: EM | Admit: 2018-12-21 | Discharge: 2018-12-21 | Disposition: A | Discharge status: Home / Self Care | Payer: Medicare Other | Attending: Emergency Medicine | Admitting: Emergency Medicine

## 2018-12-21 ENCOUNTER — Encounter (HOSPITAL_BASED_OUTPATIENT_CLINIC_OR_DEPARTMENT_OTHER): Payer: Self-pay

## 2018-12-21 ENCOUNTER — Emergency Department (HOSPITAL_BASED_OUTPATIENT_CLINIC_OR_DEPARTMENT_OTHER): Payer: Medicare Other

## 2018-12-21 ENCOUNTER — Other Ambulatory Visit: Payer: Self-pay

## 2018-12-21 DIAGNOSIS — E785 Hyperlipidemia, unspecified: Secondary | ICD-10-CM | POA: Diagnosis not present

## 2018-12-21 DIAGNOSIS — S56129A Laceration of flexor muscle, fascia and tendon of unspecified finger at forearm level, initial encounter: Secondary | ICD-10-CM

## 2018-12-21 DIAGNOSIS — Z79899 Other long term (current) drug therapy: Secondary | ICD-10-CM | POA: Diagnosis not present

## 2018-12-21 DIAGNOSIS — S66121A Laceration of flexor muscle, fascia and tendon of left index finger at wrist and hand level, initial encounter: Secondary | ICD-10-CM | POA: Diagnosis not present

## 2018-12-21 DIAGNOSIS — I10 Essential (primary) hypertension: Secondary | ICD-10-CM | POA: Insufficient documentation

## 2018-12-21 DIAGNOSIS — Y9389 Activity, other specified: Secondary | ICD-10-CM | POA: Insufficient documentation

## 2018-12-21 DIAGNOSIS — W312XXA Contact with powered woodworking and forming machines, initial encounter: Secondary | ICD-10-CM | POA: Diagnosis not present

## 2018-12-21 DIAGNOSIS — Y929 Unspecified place or not applicable: Secondary | ICD-10-CM | POA: Diagnosis not present

## 2018-12-21 DIAGNOSIS — S61211A Laceration without foreign body of left index finger without damage to nail, initial encounter: Secondary | ICD-10-CM | POA: Diagnosis present

## 2018-12-21 DIAGNOSIS — Y999 Unspecified external cause status: Secondary | ICD-10-CM | POA: Insufficient documentation

## 2018-12-21 DIAGNOSIS — I4891 Unspecified atrial fibrillation: Secondary | ICD-10-CM | POA: Diagnosis not present

## 2018-12-21 DIAGNOSIS — Z7982 Long term (current) use of aspirin: Secondary | ICD-10-CM | POA: Diagnosis not present

## 2018-12-21 MED ORDER — HYDROCODONE-ACETAMINOPHEN 5-325 MG PO TABS: 1.0000 | ORAL_TABLET | Freq: Four times a day (QID) | ORAL | 0 refills | Status: DC | PRN

## 2018-12-21 MED ORDER — LIDOCAINE HCL (PF) 1 % IJ SOLN
10.0000 mL | Freq: Once | INTRAMUSCULAR | Status: AC
  Administered 2018-12-21: 10 mL via INTRADERMAL
  Filled 2018-12-21: qty 10

## 2018-12-21 MED ORDER — CEPHALEXIN 500 MG PO CAPS: 500.0000 mg | ORAL_CAPSULE | Freq: Four times a day (QID) | ORAL | 0 refills | Status: AC

## 2018-12-21 MED ORDER — HYDROCODONE-ACETAMINOPHEN 5-325 MG PO TABS
1.0000 | ORAL_TABLET | Freq: Once | ORAL | Status: AC
  Administered 2018-12-21: 1 via ORAL
  Filled 2018-12-21: qty 1

## 2018-12-21 NOTE — ED Triage Notes (Addendum)
Pt cut left index finger with saw ~1130am-jagged lac/avulsion noted to tip and side of finger-cleaned and dressed with gauze/kling-NAD-steady gait

## 2018-12-21 NOTE — ED Notes (Signed)
Patient transported to X-ray 

## 2018-12-21 NOTE — Discharge Instructions (Addendum)
You were given a prescription for antibiotics. Please take the antibiotic prescription fully.   Prescription given for Norco. Take medication as directed and do not operate machinery, drive a car, or work while taking this medication as it can make you drowsy.   Please call your hand surgeon, Dr. Amedeo Plenty tomorrow to set up an appointment to be seen within the next week.  If you are unable to schedule an appointment with Dr. Anson Fret this week and you will need to call Dr. Brennan Bailey office to schedule an appointment for follow-up.  Please keep the wound clean and dry.  Monitor for any signs of infection.  If you experience any changes in your symptoms or any new or concerning symptoms and return to the emergency department immediately.

## 2018-12-21 NOTE — ED Provider Notes (Addendum)
[facility] EMERGENCY DEPARTMENT Provider Note   CSN: [id number] Arrival date & time: 12/21/18  1544     History Chief Complaint  Patient presents with  . Finger Injury    Jose Fuentes is a 78 y.o. male.  HPI   Pt is a 78 y/o left handed male with a h/o afib, depression, melanoma, hld, htn, who presents to the ED today for eval of finger laceration. States he was using a table saw pta and had turned it off but then accidentally ran his finger over it causing a laceration. He reports some numbers to the tip of the finger but he is able to move it. He is not on blood thinners. He believes his tdap is up to date.   Past Medical History:  Diagnosis Date  . Atrial fibrillation ([place])   . Depression   . History of melanoma   . Hyperlipidemia   . Hypertension   . Peyronie disease     Patient Active Problem List   Diagnosis Date Noted  . Atrial fibrillation ([place]) 06/11/2014    Past Surgical History:  Procedure Laterality Date  . KNEE SURGERY Left    meniscal tear  . MELANOMA EXCISION    . Thumb replacement Left   . TONSILLECTOMY         Family History  Problem Relation Age of Onset  . Heart attack Father        died in his sleep age 23  . Kidney failure Mother   . Melanoma Brother   . Melanoma Brother   . Glaucoma Sister   . Cancer Sister        glioblastoma    Social History   Tobacco Use  . Smoking status: Former Smoker    Quit date: 11/06/1968    Years since quitting: 50.1  . Smokeless tobacco: Never Used  Substance Use Topics  . Alcohol use: Yes    Alcohol/week: 0.0 standard drinks    Comment: daily  . Drug use: No    Home Medications Prior to Admission medications   Medication Sig Start Date End Date Taking? Authorizing Provider  aspirin EC 81 MG tablet Take 81 mg by mouth daily.    [provider]  buPROPion (WELLBUTRIN XL) 150 MG 24 hr tablet Take 150 mg by mouth daily.    [provider]  cephALEXin  (KEFLEX) 500 MG capsule Take 1 capsule (500 mg total) by mouth 4 (four) times daily for 7 days. 12/21/18 12/28/18  Kyel Purk S, PA-C  cholecalciferol (VITAMIN D) 1000 UNITS tablet Take 1,000 Units by mouth 2 (two) times daily.    [provider]  hydrochlorothiazide (MICROZIDE) 12.5 MG capsule Take 1 capsule by mouth daily. 05/02/14   [provider]  HYDROcodone-acetaminophen (NORCO/VICODIN) 5-325 MG tablet Take 1 tablet by mouth every 6 (six) hours as needed. 12/21/18   Elfida Shimada S, PA-C  sertraline (ZOLOFT) 100 MG tablet Take 100 mg by mouth daily.     [provider]  vitamin B-12 (CYANOCOBALAMIN) 1000 MCG tablet Take 1,000 mcg by mouth daily.    [provider]    Allergies    Patient has no known allergies.  Review of Systems   Review of Systems  Constitutional: Negative for fever.  HENT: Negative for sore throat.   Eyes: Negative for pain.  Respiratory: Negative for shortness of breath.   Cardiovascular: Negative for chest pain.  Gastrointestinal: Negative for abdominal pain.  Genitourinary: Negative for flank  pain.  Musculoskeletal:       Left index finger pain  Skin: Positive for wound.  Neurological: Positive for numbness.    Physical Exam Updated Vital Signs BP 138/73 (BP Location: Right Arm)   Pulse 72   Resp 16   SpO2 99%   Physical Exam Constitutional:      General: He is not in acute distress.    Appearance: He is well-developed.  Eyes:     Conjunctiva/sclera: Conjunctivae normal.  Cardiovascular:     Rate and Rhythm: Normal rate.  Pulmonary:     Effort: Pulmonary effort is normal.  Musculoskeletal:     Comments: Decreased sensation to the distal tip of the left index finger. ROM intact at the MCP, PIP, and DIP. On reassessment of resisted ROM, pt felt a pop and experienced pain to the palm of the hand. He then had decreased strength and ROM with flexion at the DIP joint.  Skin:    General: Skin is warm and  dry.     Comments: 3 cm jagged laceration to the left index finger (pictured below)  Neurological:     Mental Status: He is alert and oriented to person, place, and time.            ED Results / Procedures / Treatments   Labs (all labs ordered are listed, but only abnormal results are displayed) Labs Reviewed - No data to display  EKG None  Radiology DG Finger Index Left  Result Date: 12/21/2018 CLINICAL DATA:  Pt states that he cut his left index finger on a band saw today and has a wound that runs along the medial anterior aspect of the finger. Pt's finger was wrapped in a bandage due to the bleeding. EXAM: LEFT INDEX FINGER 2+V COMPARISON:  None. FINDINGS: No fracture or radiopaque foreign body. Soft tissue is ulnar aspect of the distal left index finger. Are osteoarthritic changes at the DIP joint with asymmetric joint space narrowing, mild subchondral sclerosis and small marginal osteophytes. IMPRESSION: 1. No fracture or dislocation.  No radiopaque foreign body. Electronically Signed   By: Lajean Manes M.D.   On: 12/21/2018 16:45    Procedures .Marland KitchenLaceration Repair  Date/Time: 12/21/2018 5:55 PM Performed by: Rodney Booze, PA-C Authorized by: Rodney Booze, PA-C   Consent:    Consent obtained:  Verbal   Consent given by:  Patient   Risks discussed:  Infection, pain, poor cosmetic result, need for additional repair, tendon damage and nerve damage Anesthesia (see MAR for exact dosages):    Anesthesia method:  Nerve block   Block needle gauge:  27 G   Block anesthetic:  Lidocaine 1% w/o epi   Block injection procedure:  Anatomic landmarks identified, introduced needle, incremental injection, negative aspiration for blood and anatomic landmarks palpated   Block outcome:  Incomplete block Laceration details:    Location:  Finger   Finger location:  L index finger   Length (cm):  4 Repair type:    Repair type:  Intermediate Pre-procedure details:     Preparation:  Patient was prepped and draped in usual sterile fashion and imaging obtained to evaluate for foreign bodies Exploration:    Hemostasis achieved with:  Direct pressure   Wound exploration: wound explored through full range of motion and entire depth of wound probed and visualized     Wound extent: nerve damage and tendon damage     Wound extent: no foreign bodies/material noted and no underlying fracture noted  Tendon damage location:  Upper extremity   Upper extremity tendon damage location:  Finger flexor   Tendon damage extent:  Complete transection   Tendon repair plan:  Refer for evaluation   Contaminated: no   Treatment:    Area cleansed with:  Saline   Amount of cleaning:  Standard   Irrigation solution:  Sterile saline   Irrigation volume:  1L   Irrigation method:  Pressure wash   Visualized foreign bodies/material removed: no   Skin repair:    Repair method:  Sutures   Suture size:  5-0   Suture material:  Prolene   Suture technique:  Simple interrupted   Number of sutures:  5 Approximation:    Approximation:  Close Post-procedure details:    Dressing:  Non-adherent dressing and splint for protection   Patient tolerance of procedure:  Tolerated well, no immediate complications   (including critical care time)  Medications Ordered in ED Medications  lidocaine (PF) (XYLOCAINE) 1 % injection 10 mL (10 mLs Intradermal Given 12/21/18 1645)  HYDROcodone-acetaminophen (NORCO/VICODIN) 5-325 MG per tablet 1 tablet (1 tablet Oral Given 12/21/18 1702)    ED Course  I have reviewed the triage vital signs and the nursing notes.  Pertinent labs & imaging results that were available during my care of the patient were reviewed by me and considered in my medical decision making (see chart for details).    MDM Rules/Calculators/A&P                       78 y/o male presenting for eval of laceration from table saw to the left index finger that occurred PTA. Xray  was without evidence of bony involvement. Digital block was performed.  Pressure irrigation performed. Wound explored and base of wound visualized in a bloodless field without evidence of foreign body.   Laceration occurred < 8 hours prior to repair which was well tolerated.  Tdap UTD.  Pt has no comorbidities to effect normal wound healing. Pt discharged with antibiotics. There is concern for flexor tendon involvement and pt will need to f/u with hand. Discussed case with Dr. Lenon Curt with hand surgery, who recommends loosely closing the wound and having patient f/u in the office next week.  Patient is currently established with Dr. Amedeo Plenty, I advised him to call Dr. Elveria Rising office tomorrow to schedule an appointment for follow-up and if he is unable to schedule an appointment this week and he should call Dr. Brennan Bailey office.  Will be sent home with antibiotics. He was placed in a splint. Discussed suture home care with patient and answered questions.  they are to return to the ED sooner for signs of infection. Pt is hemodynamically stable with no complaints prior to dc.     Final Clinical Impression(s) / ED Diagnoses Final diagnoses:  Laceration of left index finger without foreign body without damage to nail, initial encounter  Flexor tendon laceration of finger with open wound, initial encounter    Rx / DC Orders ED Discharge Orders         Ordered    HYDROcodone-acetaminophen (NORCO/VICODIN) 5-325 MG tablet  Every 6 hours PRN     12/21/18 1803    cephALEXin (KEFLEX) 500 MG capsule  4 times daily     12/21/18 [address], Jaslen Adcox S, PA-C 12/21/18 1758    Rodney Booze, PA-C 12/21/18 1804    Dorie Rank, MD 12/25/18 1610

## 2018-12-21 NOTE — ED Notes (Signed)
ED Provider at bedside. 

## 2019-01-26 ENCOUNTER — Ambulatory Visit: Payer: Medicare Other | Attending: Internal Medicine

## 2019-01-26 DIAGNOSIS — Z23 Encounter for immunization: Secondary | ICD-10-CM | POA: Insufficient documentation

## 2019-01-26 NOTE — Progress Notes (Signed)
   Covid-19 Vaccination Clinic  Name:  Jose Fuentes    MRN: [id number] DOB: [birth year]  01/26/2019  [name] was observed post Covid-19 immunization for 15 minutes without incidence. He was provided with Vaccine Information Sheet and instruction to access the V-Safe system.   [name] was instructed to call 911 with any severe reactions post vaccine: Marland Kitchen Difficulty breathing  . Swelling of your face and throat  . A fast heartbeat  . A bad rash all over your body  . Dizziness and weakness    Immunizations Administered    Name Date Dose VIS Date Route   Pfizer COVID-19 Vaccine 01/26/2019  9:37 AM 0.3 mL 12/15/2018 Intramuscular   Manufacturer: [place]   Lot: [id number]   [place]: [id number]

## 2019-02-16 ENCOUNTER — Ambulatory Visit: Payer: Medicare Other | Attending: Internal Medicine

## 2019-02-16 DIAGNOSIS — Z23 Encounter for immunization: Secondary | ICD-10-CM | POA: Insufficient documentation

## 2019-02-16 NOTE — Progress Notes (Signed)
   Covid-19 Vaccination Clinic  Name:  Jose Fuentes    MRN: [id number] DOB: 1940/08/26  02/16/2019  [name] was observed post Covid-19 immunization for 15 minutes without incidence. He was provided with Vaccine Information Sheet and instruction to access the V-Safe system.   [name] was instructed to call 911 with any severe reactions post vaccine: Marland Kitchen Difficulty breathing  . Swelling of your face and throat  . A fast heartbeat  . A bad rash all over your body  . Dizziness and weakness    Immunizations Administered    Name Date Dose VIS Date Route   Pfizer COVID-19 Vaccine 02/16/2019 10:29 AM 0.3 mL 12/15/2018 Intramuscular   Manufacturer: [place]   Lot: [id number]   [place]: [id number]

## 2019-10-16 ENCOUNTER — Ambulatory Visit: Payer: Medicare Other | Attending: Internal Medicine

## 2019-10-16 ENCOUNTER — Other Ambulatory Visit (HOSPITAL_BASED_OUTPATIENT_CLINIC_OR_DEPARTMENT_OTHER): Payer: Self-pay | Admitting: Internal Medicine

## 2019-10-16 DIAGNOSIS — Z23 Encounter for immunization: Secondary | ICD-10-CM

## 2019-10-24 MED FILL — PFIZER-BIONTECH COVID-19 VA: 30 | 1 days supply | Qty: 0 | Fill #0

## 2020-01-08 ENCOUNTER — Other Ambulatory Visit: Payer: Self-pay

## 2020-01-08 ENCOUNTER — Emergency Department (HOSPITAL_BASED_OUTPATIENT_CLINIC_OR_DEPARTMENT_OTHER)
Admission: EM | Admit: 2020-01-08 | Discharge: 2020-01-08 | Disposition: A | Discharge status: Home / Self Care | Payer: Medicare Other | Attending: Emergency Medicine | Admitting: Emergency Medicine

## 2020-01-08 ENCOUNTER — Encounter (HOSPITAL_BASED_OUTPATIENT_CLINIC_OR_DEPARTMENT_OTHER): Payer: Self-pay | Admitting: Emergency Medicine

## 2020-01-08 DIAGNOSIS — R1013 Epigastric pain: Secondary | ICD-10-CM | POA: Diagnosis not present

## 2020-01-08 DIAGNOSIS — Z5321 Procedure and treatment not carried out due to patient leaving prior to being seen by health care provider: Secondary | ICD-10-CM | POA: Diagnosis not present

## 2020-01-08 NOTE — ED Triage Notes (Signed)
Pt awoke with epigastric pain. Which has improved since arrival

## 2020-02-14 ENCOUNTER — Observation Stay (HOSPITAL_COMMUNITY): Payer: Medicare Other

## 2020-02-14 ENCOUNTER — Emergency Department (HOSPITAL_COMMUNITY): Payer: Medicare Other

## 2020-02-14 ENCOUNTER — Observation Stay (HOSPITAL_COMMUNITY)
Admission: EM | Admit: 2020-02-14 | Discharge: 2020-02-15 | Disposition: A | Discharge status: Home / Self Care | Payer: Medicare Other | Attending: Internal Medicine | Admitting: Internal Medicine

## 2020-02-14 ENCOUNTER — Encounter (HOSPITAL_COMMUNITY): Payer: Self-pay | Admitting: Internal Medicine

## 2020-02-14 ENCOUNTER — Other Ambulatory Visit: Payer: Self-pay

## 2020-02-14 DIAGNOSIS — G9389 Other specified disorders of brain: Secondary | ICD-10-CM | POA: Diagnosis not present

## 2020-02-14 DIAGNOSIS — R479 Unspecified speech disturbances: Secondary | ICD-10-CM | POA: Diagnosis not present

## 2020-02-14 DIAGNOSIS — I672 Cerebral atherosclerosis: Secondary | ICD-10-CM | POA: Diagnosis not present

## 2020-02-14 DIAGNOSIS — Z85820 Personal history of malignant melanoma of skin: Secondary | ICD-10-CM | POA: Insufficient documentation

## 2020-02-14 DIAGNOSIS — F32A Depression, unspecified: Secondary | ICD-10-CM | POA: Diagnosis present

## 2020-02-14 DIAGNOSIS — R531 Weakness: Secondary | ICD-10-CM | POA: Diagnosis not present

## 2020-02-14 DIAGNOSIS — I10 Essential (primary) hypertension: Secondary | ICD-10-CM | POA: Diagnosis not present

## 2020-02-14 DIAGNOSIS — G459 Transient cerebral ischemic attack, unspecified: Secondary | ICD-10-CM | POA: Diagnosis not present

## 2020-02-14 DIAGNOSIS — F1090 Alcohol use, unspecified, uncomplicated: Secondary | ICD-10-CM

## 2020-02-14 DIAGNOSIS — Z7982 Long term (current) use of aspirin: Secondary | ICD-10-CM | POA: Diagnosis not present

## 2020-02-14 DIAGNOSIS — Z20822 Contact with and (suspected) exposure to covid-19: Secondary | ICD-10-CM | POA: Diagnosis not present

## 2020-02-14 DIAGNOSIS — R262 Difficulty in walking, not elsewhere classified: Secondary | ICD-10-CM | POA: Diagnosis not present

## 2020-02-14 DIAGNOSIS — K219 Gastro-esophageal reflux disease without esophagitis: Secondary | ICD-10-CM | POA: Diagnosis not present

## 2020-02-14 DIAGNOSIS — R29898 Other symptoms and signs involving the musculoskeletal system: Secondary | ICD-10-CM | POA: Insufficient documentation

## 2020-02-14 DIAGNOSIS — G20 Parkinson's disease: Secondary | ICD-10-CM

## 2020-02-14 DIAGNOSIS — R29810 Facial weakness: Secondary | ICD-10-CM | POA: Diagnosis not present

## 2020-02-14 DIAGNOSIS — I4891 Unspecified atrial fibrillation: Secondary | ICD-10-CM | POA: Diagnosis not present

## 2020-02-14 DIAGNOSIS — I480 Paroxysmal atrial fibrillation: Secondary | ICD-10-CM | POA: Diagnosis not present

## 2020-02-14 DIAGNOSIS — I6522 Occlusion and stenosis of left carotid artery: Secondary | ICD-10-CM | POA: Diagnosis not present

## 2020-02-14 DIAGNOSIS — R410 Disorientation, unspecified: Secondary | ICD-10-CM | POA: Diagnosis not present

## 2020-02-14 DIAGNOSIS — Z79899 Other long term (current) drug therapy: Secondary | ICD-10-CM | POA: Diagnosis not present

## 2020-02-14 DIAGNOSIS — R2681 Unsteadiness on feet: Secondary | ICD-10-CM | POA: Diagnosis present

## 2020-02-14 DIAGNOSIS — R4789 Other speech disturbances: Secondary | ICD-10-CM | POA: Diagnosis not present

## 2020-02-14 DIAGNOSIS — R471 Dysarthria and anarthria: Secondary | ICD-10-CM

## 2020-02-14 DIAGNOSIS — Z7289 Other problems related to lifestyle: Secondary | ICD-10-CM

## 2020-02-14 DIAGNOSIS — Z789 Other specified health status: Secondary | ICD-10-CM

## 2020-02-14 DIAGNOSIS — Z87891 Personal history of nicotine dependence: Secondary | ICD-10-CM | POA: Diagnosis not present

## 2020-02-14 DIAGNOSIS — R4781 Slurred speech: Secondary | ICD-10-CM | POA: Diagnosis not present

## 2020-02-14 LAB — RESP PANEL BY RT-PCR (FLU A&B, COVID) ARPGX2
Influenza A by PCR: NEGATIVE
Influenza B by PCR: NEGATIVE
SARS Coronavirus 2 by RT PCR: NEGATIVE

## 2020-02-14 LAB — DIFFERENTIAL
Abs Immature Granulocytes: 0.01 10*3/uL (ref 0.00–0.07)
Basophils Absolute: 0 10*3/uL (ref 0.0–0.1)
Basophils Relative: 1 %
Eosinophils Absolute: 0.1 10*3/uL (ref 0.0–0.5)
Eosinophils Relative: 1 %
Immature Granulocytes: 0 %
Lymphocytes Relative: 34 %
Lymphs Abs: 1.5 10*3/uL (ref 0.7–4.0)
Monocytes Absolute: 0.5 10*3/uL (ref 0.1–1.0)
Monocytes Relative: 11 %
Neutro Abs: 2.3 10*3/uL (ref 1.7–7.7)
Neutrophils Relative %: 53 %

## 2020-02-14 LAB — COMPREHENSIVE METABOLIC PANEL
ALT: 19 U/L (ref 0–44)
AST: 25 U/L (ref 15–41)
Albumin: 3.7 g/dL (ref 3.5–5.0)
Alkaline Phosphatase: 80 U/L (ref 38–126)
Anion gap: 13 (ref 5–15)
BUN: 12 mg/dL (ref 8–23)
CO2: 24 mmol/L (ref 22–32)
Calcium: 8.9 mg/dL (ref 8.9–10.3)
Chloride: 98 mmol/L (ref 98–111)
Creatinine, Ser: 0.86 mg/dL (ref 0.61–1.24)
GFR, Estimated: >60 mL/min (ref >60)
Glucose, Bld: 95 mg/dL (ref 70–99)
Potassium: 3.6 mmol/L (ref 3.5–5.1)
Sodium: 135 mmol/L (ref 135–145)
Total Bilirubin: 1 mg/dL (ref 0.3–1.2)
Total Protein: 6.5 g/dL (ref 6.5–8.1)

## 2020-02-14 LAB — URINALYSIS, ROUTINE W REFLEX MICROSCOPIC
Bilirubin Urine: NEGATIVE
Glucose, UA: NEGATIVE mg/dL
Hgb urine dipstick: NEGATIVE
Ketones, ur: NEGATIVE mg/dL
Leukocytes,Ua: NEGATIVE
Nitrite: NEGATIVE
Protein, ur: NEGATIVE mg/dL
Specific Gravity, Urine: 1.015 (ref 1.005–1.030)
pH: 6 (ref 5.0–8.0)

## 2020-02-14 LAB — PROTIME-INR
INR: 1.1 (ref 0.8–1.2)
Prothrombin Time: 13.3 seconds (ref 11.4–15.2)

## 2020-02-14 LAB — ETHANOL: Alcohol, Ethyl (B): <10 mg/dL (ref <10)

## 2020-02-14 LAB — LIPID PANEL
Cholesterol: 197 mg/dL (ref 0–200)
HDL: 101 mg/dL (ref >40)
LDL Cholesterol: 89 mg/dL (ref 0–99)
Total CHOL/HDL Ratio: 2 RATIO
Triglycerides: 34 mg/dL (ref <150)
VLDL: 7 mg/dL (ref 0–40)

## 2020-02-14 LAB — CBC
HCT: 43.8 % (ref 39.0–52.0)
Hemoglobin: 14.7 g/dL (ref 13.0–17.0)
MCH: 31.5 pg (ref 26.0–34.0)
MCHC: 33.6 g/dL (ref 30.0–36.0)
MCV: 93.8 fL (ref 80.0–100.0)
Platelets: 153 10*3/uL (ref 150–400)
RBC: 4.67 MIL/uL (ref 4.22–5.81)
RDW: 14.5 % (ref 11.5–15.5)
WBC: 4.3 10*3/uL (ref 4.0–10.5)
nRBC: 0 % (ref 0.0–0.2)

## 2020-02-14 LAB — RAPID URINE DRUG SCREEN, HOSP PERFORMED
Amphetamines: NOT DETECTED
Barbiturates: NOT DETECTED
Benzodiazepines: NOT DETECTED
Cocaine: NOT DETECTED
Opiates: NOT DETECTED
Tetrahydrocannabinol: NOT DETECTED

## 2020-02-14 LAB — APTT: aPTT: 27 seconds (ref 24–36)

## 2020-02-14 LAB — HEMOGLOBIN A1C
Hgb A1c MFr Bld: 5.8 % — ABNORMAL HIGH (ref 4.8–5.6)
Mean Plasma Glucose: 119.76 mg/dL

## 2020-02-14 LAB — VITAMIN B12: Vitamin B-12: 836 pg/mL (ref 180–914)

## 2020-02-14 LAB — TSH: TSH: 2.057 u[IU]/mL (ref 0.350–4.500)

## 2020-02-14 MED ORDER — PANTOPRAZOLE SODIUM 40 MG PO TBEC
40.0000 mg | DELAYED_RELEASE_TABLET | Freq: Every day | ORAL | Status: DC
  Administered 2020-02-14: 40 mg via ORAL
  Filled 2020-02-14: qty 1

## 2020-02-14 MED ORDER — ASPIRIN 325 MG PO TABS
325.0000 mg | ORAL_TABLET | Freq: Every day | ORAL | Status: DC
  Administered 2020-02-14 – 2020-02-15 (×2): 325 mg via ORAL
  Filled 2020-02-14 (×2): qty 1

## 2020-02-14 MED ORDER — LORAZEPAM 1 MG PO TABS: 1.0000 mg | ORAL_TABLET | ORAL | Status: DC | PRN

## 2020-02-14 MED ORDER — FOLIC ACID 1 MG PO TABS
1.0000 mg | ORAL_TABLET | Freq: Every day | ORAL | Status: DC
  Administered 2020-02-15: 1 mg via ORAL
  Filled 2020-02-14: qty 1

## 2020-02-14 MED ORDER — ATORVASTATIN CALCIUM 40 MG PO TABS
40.0000 mg | ORAL_TABLET | Freq: Every day | ORAL | Status: DC
  Administered 2020-02-14: 40 mg via ORAL
  Filled 2020-02-14: qty 1

## 2020-02-14 MED ORDER — ACETAMINOPHEN 650 MG RE SUPP: 650.0000 mg | RECTAL | Status: DC | PRN

## 2020-02-14 MED ORDER — THIAMINE HCL 100 MG PO TABS
100.0000 mg | ORAL_TABLET | Freq: Every day | ORAL | Status: DC
  Administered 2020-02-15: 100 mg via ORAL
  Filled 2020-02-14: qty 1

## 2020-02-14 MED ORDER — ENOXAPARIN SODIUM 40 MG/0.4ML ~~LOC~~ SOLN
40.0000 mg | SUBCUTANEOUS | Status: DC
  Administered 2020-02-14 – 2020-02-15 (×2): 40 mg via SUBCUTANEOUS
  Filled 2020-02-14 (×2): qty 0.4

## 2020-02-14 MED ORDER — ACETAMINOPHEN 160 MG/5ML PO SOLN: 650.0000 mg | ORAL | Status: DC | PRN

## 2020-02-14 MED ORDER — SODIUM CHLORIDE 0.9 % IV SOLN: Freq: Once | INTRAVENOUS | Status: AC

## 2020-02-14 MED ORDER — SENNOSIDES-DOCUSATE SODIUM 8.6-50 MG PO TABS: 1.0000 | ORAL_TABLET | Freq: Every evening | ORAL | Status: DC | PRN

## 2020-02-14 MED ORDER — STROKE: EARLY STAGES OF RECOVERY BOOK
Freq: Once | Status: AC
  Filled 2020-02-14: qty 1

## 2020-02-14 MED ORDER — ACETAMINOPHEN 325 MG PO TABS: 650.0000 mg | ORAL_TABLET | ORAL | Status: DC | PRN

## 2020-02-14 MED ORDER — IOHEXOL 300 MG/ML  SOLN
75.0000 mL | Freq: Once | INTRAMUSCULAR | Status: AC | PRN
  Administered 2020-02-14: 75 mL via INTRAVENOUS

## 2020-02-14 MED ORDER — ADULT MULTIVITAMIN W/MINERALS CH
1.0000 | ORAL_TABLET | Freq: Every day | ORAL | Status: DC
  Administered 2020-02-15: 1 via ORAL
  Filled 2020-02-14: qty 1

## 2020-02-14 MED ORDER — LORAZEPAM 2 MG/ML IJ SOLN: 1.0000 mg | INTRAMUSCULAR | Status: DC | PRN

## 2020-02-14 MED ORDER — BUPROPION HCL ER (XL) 150 MG PO TB24: 300.0000 mg | ORAL_TABLET | Freq: Every day | ORAL | Status: DC

## 2020-02-14 MED ORDER — THIAMINE HCL 100 MG/ML IJ SOLN: 100.0000 mg | Freq: Every day | INTRAMUSCULAR | Status: DC

## 2020-02-14 NOTE — ED Provider Notes (Signed)
[facility] EMERGENCY DEPARTMENT Provider Note   CSN: [id number] Arrival date & time:        History Chief Complaint  Patient presents with  . Weakness    Jose Fuentes is a 80 y.o. male with pertinent past medical history of A. fib, not anticoagulated, depression, hyperlipidemia, hypertension that presents the emergency department today for complaints of episode of slurred speech and difficulty walking.  Patient states that he woke up this morning feeling fine, states that he went to use the restroom around 5 AM, however was unable to walk due to weakness, unsure if one leg was weaker than other leg.   States that he called his wife to help him to the bathroom, however he noticed that he was slurring his speech and so did his wife.  States that this lasted until EMS had gotten there, EMS reports that he was able to ambulate upon arrival.  At this time, patient speech has returned back to normal according to patient.  Denies any fall, no trauma to head.  Has never had a stroke before.  No blood thinners.  Denies any chest pain, shortness of breath, nausea, vomiting, headache, myalgias.  Patient does not feel weak currently.  No fevers or chills.  States that he was in his normal health yesterday.  Denies any dizziness.  No other complaints at this time.  HPI     Past Medical History:  Diagnosis Date  . Atrial fibrillation ([place])   . Depression   . History of melanoma   . Hyperlipidemia   . Hypertension   . Peyronie disease     Patient Active Problem List   Diagnosis Date Noted  . Atrial fibrillation ([place]) 06/11/2014    Past Surgical History:  Procedure Laterality Date  . KNEE SURGERY Left    meniscal tear  . MELANOMA EXCISION    . Thumb replacement Left   . TONSILLECTOMY         Family History  Problem Relation Age of Onset  . Heart attack Father        died in his sleep age 62  . Kidney failure Mother   . Melanoma Brother   . Melanoma  Brother   . Glaucoma Sister   . Cancer Sister        glioblastoma    Social History   Tobacco Use  . Smoking status: Former Smoker    Quit date: 11/06/1968    Years since quitting: 51.3  . Smokeless tobacco: Never Used  Vaping Use  . Vaping Use: Never used  Substance Use Topics  . Alcohol use: Yes    Alcohol/week: 0.0 standard drinks    Comment: daily  . Drug use: No    Home Medications Prior to Admission medications   Medication Sig Start Date End Date Taking? Authorizing Provider  aspirin EC 81 MG tablet Take 81 mg by mouth daily.    [provider]  buPROPion (WELLBUTRIN XL) 150 MG 24 hr tablet Take 150 mg by mouth daily.    [provider]  cholecalciferol (VITAMIN D) 1000 UNITS tablet Take 1,000 Units by mouth 2 (two) times daily.    [provider]  hydrochlorothiazide (MICROZIDE) 12.5 MG capsule Take 1 capsule by mouth daily. 05/02/14   [provider]  HYDROcodone-acetaminophen (NORCO/VICODIN) 5-325 MG tablet Take 1 tablet by mouth every 6 (six) hours as needed. 12/21/18   Couture, Cortni S, PA-C  sertraline (ZOLOFT) 100 MG tablet Take 100  mg by mouth daily.     [provider]  vitamin B-12 (CYANOCOBALAMIN) 1000 MCG tablet Take 1,000 mcg by mouth daily.    [provider]    Allergies    Patient has no known allergies.  Review of Systems   Review of Systems  Constitutional: Negative for chills, diaphoresis, fatigue and fever.  HENT: Negative for congestion, sore throat and trouble swallowing.   Eyes: Negative for pain and visual disturbance.  Respiratory: Negative for cough, shortness of breath and wheezing.   Cardiovascular: Negative for chest pain, palpitations and leg swelling.  Gastrointestinal: Negative for abdominal distention, abdominal pain, diarrhea, nausea and vomiting.  Genitourinary: Negative for difficulty urinating.  Musculoskeletal: Negative for back pain, neck pain and neck stiffness.  Skin:  Negative for pallor.  Neurological: Positive for speech difficulty. Negative for dizziness, tremors, syncope, facial asymmetry, weakness, light-headedness and headaches.  Psychiatric/Behavioral: Negative for confusion.    Physical Exam Updated Vital Signs BP (!) 149/80   Pulse 80   Temp 98.2 F (36.8 C)   Resp 14   Ht 5\' 6"  (1.676 m)   Wt 59.9 kg   SpO2 99%   BMI 21.31 kg/m   Physical Exam Constitutional:      General: He is not in acute distress.    Appearance: Normal appearance. He is not ill-appearing, toxic-appearing or diaphoretic.  HENT:     Head: Normocephalic and atraumatic.     Mouth/Throat:     Mouth: Mucous membranes are moist.     Pharynx: Oropharynx is clear.  Eyes:     General: No scleral icterus.    Extraocular Movements: Extraocular movements intact.     Pupils: Pupils are equal, round, and reactive to light.  Cardiovascular:     Rate and Rhythm: Normal rate and regular rhythm.     Pulses: Normal pulses.     Heart sounds: Normal heart sounds.  Pulmonary:     Effort: Pulmonary effort is normal. No respiratory distress.     Breath sounds: Normal breath sounds. No stridor. No wheezing, rhonchi or rales.  Chest:     Chest wall: No tenderness.  Abdominal:     General: Abdomen is flat. There is no distension.     Palpations: Abdomen is soft.     Tenderness: There is no abdominal tenderness. There is no guarding or rebound.  Musculoskeletal:        General: No swelling or tenderness. Normal range of motion.     Cervical back: Normal range of motion and neck supple. No rigidity.     Right lower leg: No edema.     Left lower leg: No edema.  Skin:    General: Skin is warm and dry.     Capillary Refill: Capillary refill takes less than 2 seconds.     Coloration: Skin is not pale.  Neurological:     General: No focal deficit present.     Mental Status: He is alert and oriented to person, place, and time.     Comments: Alert and oriented times 3. Clear  speech, does stutter infrequently, but no slurring of words, no aphasia. No facial droop. CNIII-XII grossly intact. Bilateral upper and lower extremities' sensation grossly intact. 5/5 symmetric strength with grip strength and with plantar and dorsi flexion bilaterally. Good coordination. Negative pronator drift. Negative Romberg sign.    Psychiatric:        Mood and Affect: Mood normal.        Behavior: Behavior normal.  ED Results / Procedures / Treatments   Labs (all labs ordered are listed, but only abnormal results are displayed) Labs Reviewed  RESP PANEL BY RT-PCR (FLU A&B, COVID) ARPGX2  ETHANOL  PROTIME-INR  APTT  CBC  DIFFERENTIAL  COMPREHENSIVE METABOLIC PANEL  RAPID URINE DRUG SCREEN, HOSP PERFORMED  URINALYSIS, ROUTINE W REFLEX MICROSCOPIC    EKG EKG Interpretation  Date/Time:  Thursday February 14 2020 06:13:13 EST Ventricular Rate:  78 PR Interval:    QRS Duration: 89 QT Interval:  390 QTC Calculation: 445 R Axis:   69 Text Interpretation: Sinus rhythm Abnormal R-wave progression, early transition When compared with ECG of 01/08/2020, Premature ventricular complexes are no longer present Confirmed by Delora Fuel ([id number]) on 02/14/2020 6:22:37 AM   Radiology CT HEAD WO CONTRAST  Result Date: 02/14/2020 CLINICAL DATA:  80 year old male with weakness and abnormal speech upon waking. EXAM: CT HEAD WITHOUT CONTRAST TECHNIQUE: Contiguous axial images were obtained from the base of the skull through the vertex without intravenous contrast. COMPARISON:  Brain MRI 02/15/2015.  Head CT 06/21/2018. FINDINGS: Brain: Cerebral volume is stable and within normal limits for age. No midline shift, ventriculomegaly, mass effect, evidence of mass lesion, intracranial hemorrhage or evidence of cortically based acute infarction. Patchy fairly mild for age white matter hypodensity in both hemispheres appears stable since 2020. Vascular: Mild Calcified atherosclerosis at the skull base.  No suspicious intracranial vascular hyperdensity. Skull: No acute osseous abnormality identified. Sinuses/Orbits: Visualized paranasal sinuses and mastoids are clear. Other: Stable orbit and scalp soft tissues. IMPRESSION: Stable since 2020 with mild for age cerebral white matter changes. Electronically Signed   By: Genevie Ann M.D.   On: 02/14/2020 07:50    Procedures Procedures   Medications Ordered in ED Medications - No data to display  ED Course  I have reviewed the triage vital signs and the nursing notes.  Pertinent labs & imaging results that were available during my care of the patient were reviewed by me and considered in my medical decision making (see chart for details).    MDM Rules/Calculators/A&P   ABCD2 Score: 3                     Cato Liburd is a 80 y.o. male with pertinent past medical history of A. fib, not anticoagulated, depression, hyperlipidemia, hypertension that presents the emergency department today for complaints of episode of slurred speech and difficulty walking that lasted around 30-50 minutes.  Patient is alert and oriented x3, normal neuro exam currently with normal speech currently.  Unable to assess gait currently, however patient most likely with TIA. ABCD2 score 4. High risk with A. Fib. Will obtain basic work up.  Work-up today benign, CT head without any acute intracranial abnormalities. Consulted Dr. Cheral Marker, neurology will consult.  Spoke to Dr. Tamala Julian, [facility] who will accept patient.  The patient appears reasonably stabilized for admission considering the current resources, flow, and capabilities available in the ED at this time, and I doubt any other [facility] requiring further screening and/or treatment in the ED prior to admission.  I discussed this case with my attending physician who cosigned this note including patient's presenting symptoms, physical exam, and planned diagnostics and interventions. Attending physician stated agreement with plan  or made changes to plan which were implemented.   Final Clinical Impression(s) / ED Diagnoses Final diagnoses:  TIA (transient ischemic attack)    Rx / DC Orders ED Discharge Orders    None  Alfredia Client, PA-C 02/14/20 [id number]    Davonna Belling, MD 02/14/20 [phone]

## 2020-02-14 NOTE — Progress Notes (Signed)
Rehab Admissions Coordinator Note:  Patient was screened by Cleatrice Burke for appropriateness for an Inpatient Acute Rehab Consult per therapy recommendation. Noted pt under observation status. If patient would meet an inpatient need for admission, he could be considered for a possible CIR admit. Please advise.  Cleatrice Burke RN MSN 02/14/2020, 6:50 PM  I can be reached at [phone].

## 2020-02-14 NOTE — Evaluation (Addendum)
Physical Therapy Evaluation Patient Details Name: Jose Fuentes MRN: [id number] DOB: [birth year] Today's Date: 02/14/2020   History of Present Illness  Pt is an 80 y/o male admitted secondary to gait instability, dysarthria, and confusion. Per neuro notes suggestive of a Parkinsonian syndrome. PMH includes a fib, HTN, and depression.  Clinical Impression  Pt admitted secondary to problem above with deficits below. Able to transfer to/from [facility] with mod A. Pt with posterior lean and short, shuffled steps throughout. Per pt and pt's wife, pt was previously very independent. Feel he would benefit from CIR level therapies at d/c to regain independence. Will continue to follow acutely.     Follow Up Recommendations CIR    Equipment Recommendations  Rolling walker with 5" wheels;3in1 (PT)    Recommendations for Other Services Rehab consult     Precautions / Restrictions Precautions Precautions: Fall Restrictions Weight Bearing Restrictions: No      Mobility  Bed Mobility Overal bed mobility: Needs Assistance Bed Mobility: Supine to Sit;Sit to Supine     Supine to sit: Min assist Sit to supine: Min guard   General bed mobility comments: Min A for trunk elevation and assist with scooting hips to EOB.    Transfers Overall transfer level: Needs assistance Equipment used: 1 person hand held assist Transfers: Sit to/from [facility] Sit to Stand: Mod assist Stand pivot transfers: Mod assist       General transfer comment: Pt with posterior lean and required manual assist to perform anterior weight shift. Unable to maintain upright without support.  Pt with very short, shuffled gait steps to transfer to/from [facility]. PT stood in front of pt and had pt hold to PT arms.  Ambulation/Gait                Stairs            Wheelchair Mobility    Modified Rankin (Stroke Patients Only)       Balance Overall balance assessment: Needs  assistance Sitting-balance support: No upper extremity supported;Feet supported Sitting balance-Leahy Scale: Fair   Postural control: Posterior lean Standing balance support: Bilateral upper extremity supported Standing balance-Leahy Scale: Poor Standing balance comment: Reliant on UE and external support                             Pertinent Vitals/Pain Pain Assessment: No/denies pain    Home Living Family/patient expects to be discharged to:: Private residence Living Arrangements: Spouse/significant other Available Help at Discharge: Family Type of Home: House Home Access: Level entry     Home Layout: Two level;Able to live on main level with bedroom/bathroom Home Equipment: Other (comment) (walking sticks)      Prior Function Level of Independence: Independent with assistive device(s)         Comments: Wife reports occasional use of walking stick, but overall very independent.     Hand Dominance        Extremity/Trunk Assessment   Upper Extremity Assessment Upper Extremity Assessment: Defer to OT evaluation    Lower Extremity Assessment Lower Extremity Assessment: Generalized weakness    Cervical / Trunk Assessment Cervical / Trunk Assessment: Kyphotic  Communication   Communication: Other (comment) (soft spoken)  Cognition Arousal/Alertness: Awake/alert Behavior During Therapy: Flat affect Overall Cognitive Status: Impaired/Different from baseline Area of Impairment: Safety/judgement;Problem solving  Safety/Judgement: Decreased awareness of safety   Problem Solving: Slow processing;Difficulty sequencing        General Comments General comments (skin integrity, edema, etc.): Pt's wife present throughout    Exercises     Assessment/Plan    PT Assessment Patient needs continued PT services  PT Problem List Decreased strength;Decreased balance;Decreased mobility;Decreased cognition;Decreased safety  awareness;Decreased knowledge of precautions       PT Treatment Interventions Gait training;Functional mobility training;Therapeutic activities;Therapeutic exercise;Balance training;DME instruction;Patient/family education;Neuromuscular re-education    PT Goals (Current goals can be found in the Care Plan section)  Acute Rehab PT Goals Patient Stated Goal: to be independent PT Goal Formulation: With patient Time For Goal Achievement: 02/28/20 Potential to Achieve Goals: Good    Frequency Min 3X/week   Barriers to discharge        Co-evaluation               AM-PAC PT "6 Clicks" Mobility  Outcome Measure Help needed turning from your back to your side while in a flat bed without using bedrails?: A Little Help needed moving from lying on your back to sitting on the side of a flat bed without using bedrails?: A Little Help needed moving to and from a bed to a chair (including a wheelchair)?: A Lot Help needed standing up from a chair using your arms (e.g., wheelchair or bedside chair)?: A Lot Help needed to walk in hospital room?: A Lot Help needed climbing 3-5 steps with a railing? : Total 6 Click Score: 13    End of Session Equipment Utilized During Treatment: Gait belt Activity Tolerance: Patient tolerated treatment well Patient left: in bed;with call bell/phone within reach (on stretcher in ED) Nurse Communication: Mobility status PT Visit Diagnosis: Other symptoms and signs involving the nervous system (R29.898);Difficulty in walking, not elsewhere classified (R26.2)    Time: 7416-3845 PT Time Calculation (min) (ACUTE ONLY): 20 min   Charges:   PT Evaluation $PT Eval Moderate Complexity: 1 Mod          Reuel Derby, PT, DPT  Acute Rehabilitation Services  Pager: [phone] Office: [phone]   Rudean Hitt 02/14/2020, 12:05 PM

## 2020-02-14 NOTE — Progress Notes (Signed)
New Admission Note:  Arrival Method: arrived by stretcher from [facility] ED Mental Orientation: A&O x 4 Telemetry: yes SR noted Assessment: Completed Skin: no abnormalities IV: Left AC infusing NS @ 75 ml/hr Pain:no complaints of pain or discomfort Safety Measures: Safety Fall Prevention Plan was given, discussed. Admission: Completed 3W: Patient has been orientated to the room, unit and the staff. Family: pt wife at bedside and is pleasant and supportive   Orders have been reviewed and implemented. Will continue to monitor the patient. Call light has been placed within reach and bed alarm has been activated.   Janus Molder ,RN

## 2020-02-14 NOTE — ED Triage Notes (Signed)
Emergency Medicine Provider Triage Evaluation Note  Jose Fuentes , a 80 y.o. male  was evaluated in triage.  Pt complains of episode of difficulty speaking and difficulty walking.  Speech is back to normal, he does not know if he is able to ambulate as he came in by ambulance.  However, EMS reports he was ambulatory on the scene.  Review of Systems  Positive: Weakness, difficulty talking Negative: Chest pain, dyspnea, nausea, vomiting  Physical Exam  BP 136/84   Pulse 79   Temp 98 F (36.7 C) (Oral)   Resp 15   Ht 5\' 6"  (1.676 m)   Wt 59.9 kg   SpO2 97%   BMI 21.31 kg/m  Gen:   Awake, no distress   HEENT:  Atraumatic  Resp:  Normal effort  Cardiac:  Normal rate  Abd:   Nondistended, nontender  MSK:   Moves extremities without difficulty  Neuro:  Speech clear strength 5/5 in both arms and both legs, no pronator drift  Medical Decision Making  Medically screening exam initiated at 6:47 AM.  Appropriate orders placed.  Jose Fuentes was informed that the remainder of the evaluation will be completed by another provider, this initial triage assessment does not replace that evaluation, and the importance of remaining in the ED until their evaluation is complete.  Clinical Impression  Probable transient ischemic attack.   Jose Fuel, MD [birth year] [phone]

## 2020-02-14 NOTE — ED Notes (Signed)
Neurology at bedside.

## 2020-02-14 NOTE — H&P (Addendum)
History and Physical    Sorin Frimpong NLZ:[id number] DOB: 1940-10-17 DOA: 02/14/2020  Referring MD/NP/PA: Alfredia Client, PA-C PCP: Harlan Stains, MD  Consultants: Dr. Hochrein-cardiology Patient coming from: Home via EMS  Chief Complaint: Confusion and inability to walk  I have personally briefly reviewed patient's old medical records in [place]   HPI: Jose Fuentes is a 80 y.o. male with medical history significant of hypertension, hyperlipidemia, atrial fibrillation, and depression presents with complaints of confusion, change in speech, and inability to walk.  History is taken from the patient and his wife over the phone.  He tried to get up out of bed this morning around 5 AM to go use the bathroom, but after taking 1 step was unable to move.  Patient notes that his legs were too weak  and he could not move another step.  He was able to sit back down and did not fall.  Patient called down to his wife who came upstairs and noticed that his speech was slurred and he was not making sense.  Patient complained of his legs feeling paralyzed.  Earlier this week on Monday he had gotten lost while trying to go to the dentist office in [place] where he spent at least 40 time cystoscopy and ended up in [facility].  His wife had ended up coming to get him.  For the last 3 days or so he had been waking up and complaining of feeling dizzy like the room spinning around him. Normally he is able to get around without assistance and utilizes a cane when he is out in the garden.  Patient has had no problems with his memory in the past.  Patient reports that over the last year, however that he has had at least 15 falls.  Pristiq had been increased from 50 mg daily up to 100 mg daily back in November as per the patient's wife.  He denies having any palpitations or chest pain symptoms.  Patient feels like he knows when he is in atrial fibrillation reports that he has not been in this in over 10  years possibly.  He does admit to normally drinking 2 glasses wine with his wife daily.  ED Course: Upon admission hospital patient was noted to be afebrile, pulse 77-103, respirations 14-28, blood pressures 130/84-149/80, and O2 saturation maintained on room air.CT scan of the brain negative for any acute abnormalities. Labs including CBC, CMP, APTT, and PT/INR were within normal limits. Influenza and COVID-19 screening were negative. Neurology was consulted and recommended completion of stroke work-up.  Review of Systems  Constitutional: Negative for fever and malaise/fatigue.  HENT: Negative for congestion and sinus pain.   Eyes: Negative for photophobia and pain.  Respiratory: Negative for cough and shortness of breath.   Cardiovascular: Negative for chest pain.  Gastrointestinal: Negative for abdominal pain, nausea and vomiting.  Genitourinary: Negative for dysuria.  Musculoskeletal: Positive for falls. Negative for joint pain.  Skin: Negative for rash.  Neurological: Positive for dizziness, speech change and focal weakness.  Psychiatric/Behavioral: Positive for memory loss. Negative for substance abuse.    Past Medical History:  Diagnosis Date  . Atrial fibrillation ([place])   . Depression   . History of melanoma   . Hyperlipidemia   . Hypertension   . Peyronie disease     Past Surgical History:  Procedure Laterality Date  . KNEE SURGERY Left    meniscal tear  . MELANOMA EXCISION    . Thumb replacement  Left   . TONSILLECTOMY       reports that he quit smoking about 51 years ago. He has never used smokeless tobacco. He reports current alcohol use. He reports that he does not use drugs.  No Known Allergies  Family History  Problem Relation Age of Onset  . Heart attack Father        died in his sleep age 70  . Kidney failure Mother   . Melanoma Brother   . Melanoma Brother   . Glaucoma Sister   . Cancer Sister        glioblastoma    Prior to Admission medications    Medication Sig Start Date End Date Taking? Authorizing Provider  aspirin EC 81 MG tablet Take 81 mg by mouth daily.    [provider]  buPROPion (WELLBUTRIN XL) 150 MG 24 hr tablet Take 150 mg by mouth daily.    [provider]  cholecalciferol (VITAMIN D) 1000 UNITS tablet Take 1,000 Units by mouth 2 (two) times daily.    [provider]  hydrochlorothiazide (MICROZIDE) 12.5 MG capsule Take 1 capsule by mouth daily. 05/02/14   [provider]  HYDROcodone-acetaminophen (NORCO/VICODIN) 5-325 MG tablet Take 1 tablet by mouth every 6 (six) hours as needed. 12/21/18   Couture, Cortni S, PA-C  sertraline (ZOLOFT) 100 MG tablet Take 100 mg by mouth daily.     [provider]  vitamin B-12 (CYANOCOBALAMIN) 1000 MCG tablet Take 1,000 mcg by mouth daily.    [provider]    Physical Exam:  Constitutional: Elderly male who appears to be Vitals:   02/14/20 0715 02/14/20 0738 02/14/20 0745 02/14/20 0845  BP: 138/75 135/75 136/76 130/86  Pulse: 81 81 (!) 103 81  Resp: 19 17 (!) 28 15  Temp:  98.2 F (36.8 C)    TempSrc:      SpO2: 95% 97% 98% 97%  Weight:      Height:       Eyes: PERRL, lids and conjunctivae normal ENMT: Mucous membranes are moist. Posterior pharynx clear of any exudate or lesions.  Neck: normal, supple, no masses, no thyromegaly Respiratory: clear to auscultation bilaterally, no wheezing, no crackles. Normal respiratory effort. No accessory muscle use.  Cardiovascular: Regular rate and rhythm, no murmurs / rubs / gallops. No extremity edema. 2+ pedal pulses. No carotid bruits.  Abdomen: no tenderness, no masses palpated. No hepatosplenomegaly. Bowel sounds positive.  Musculoskeletal: no clubbing / cyanosis. No joint deformity upper and lower extremities. Good ROM, no contractures. Normal muscle tone.  Skin: no rashes, lesions, ulcers. No induration Neurologic: CN 2-12 grossly intact.  Strength appears to be 5/5 in all  extremities.  Patient has somewhat of a tremor present. Psychiatric: Patient alert and oriented to person and place, but intermittently is confused with exact dates and time.  Thought processes scattered    Labs on Admission: I have personally reviewed following labs and imaging studies  CBC: Recent Labs  Lab 02/14/20 0616  WBC 4.3  NEUTROABS 2.3  HGB 14.7  HCT 43.8  MCV 93.8  PLT 001   Basic Metabolic Panel: Recent Labs  Lab 02/14/20 0616  NA 135  K 3.6  CL 98  CO2 24  GLUCOSE 95  BUN 12  CREATININE 0.86  CALCIUM 8.9   GFR: Estimated Creatinine Clearance: 58 mL/min (by C-G formula based on SCr of 0.86 mg/dL). Liver Function Tests: Recent Labs  Lab 02/14/20 0616  AST 25  ALT 19  ALKPHOS 80  BILITOT 1.0  PROT 6.5  ALBUMIN 3.7   No results for input(s): LIPASE, AMYLASE in the last 168 hours. No results for input(s): AMMONIA in the last 168 hours. Coagulation Profile: Recent Labs  Lab 02/14/20 0616  INR 1.1   Cardiac Enzymes: No results for input(s): CKTOTAL, CKMB, CKMBINDEX, TROPONINI in the last 168 hours. BNP (last 3 results) No results for input(s): PROBNP in the last 8760 hours. HbA1C: No results for input(s): HGBA1C in the last 72 hours. CBG: No results for input(s): GLUCAP in the last 168 hours. Lipid Profile: No results for input(s): CHOL, HDL, LDLCALC, TRIG, CHOLHDL, LDLDIRECT in the last 72 hours. Thyroid Function Tests: No results for input(s): TSH, T4TOTAL, FREET4, T3FREE, THYROIDAB in the last 72 hours. Anemia Panel: No results for input(s): VITAMINB12, FOLATE, FERRITIN, TIBC, IRON, RETICCTPCT in the last 72 hours. Urine analysis:    Component Value Date/Time   COLORURINE YELLOW 02/14/2020 [place] 02/14/2020 0754   LABSPEC 1.015 02/14/2020 0754   PHURINE 6.0 02/14/2020 0754   GLUCOSEU NEGATIVE 02/14/2020 0754   HGBUR NEGATIVE 02/14/2020 0754   BILIRUBINUR NEGATIVE 02/14/2020 0754   KETONESUR NEGATIVE 02/14/2020  0754   PROTEINUR NEGATIVE 02/14/2020 0754   UROBILINOGEN 0.2 05/06/2009 0145   NITRITE NEGATIVE 02/14/2020 0754   LEUKOCYTESUR NEGATIVE 02/14/2020 0754   Sepsis Labs: Recent Results (from the past 240 hour(s))  Resp Panel by RT-PCR (Flu A&B, Covid) Nasopharyngeal Swab     Status: None   Collection Time: 02/14/20  6:47 AM   Specimen: Nasopharyngeal Swab; Nasopharyngeal(NP) swabs in vial transport medium  Result Value Ref Range Status   SARS Coronavirus 2 by RT PCR NEGATIVE NEGATIVE Final    Comment: (NOTE) SARS-CoV-2 target nucleic acids are NOT DETECTED.  The SARS-CoV-2 RNA is generally detectable in upper respiratory specimens during the acute phase of infection. The lowest concentration of SARS-CoV-2 viral copies this assay can detect is 138 copies/mL. A negative result does not preclude SARS-Cov-2 infection and should not be used as the sole basis for treatment or other patient management decisions. A negative result may occur with  improper specimen collection/handling, submission of specimen other than nasopharyngeal swab, presence of viral mutation(s) within the areas targeted by this assay, and inadequate number of viral copies(<138 copies/mL). A negative result must be combined with clinical observations, patient history, and epidemiological information. The expected result is Negative.  Fact Sheet for Patients:  EntrepreneurPulse.com.au  Fact Sheet for Healthcare Providers:  IncredibleEmployment.be  This test is no t yet approved or cleared by the [place] FDA and  has been authorized for detection and/or diagnosis of SARS-CoV-2 by FDA under an Emergency Use Authorization (EUA). This EUA will remain  in effect (meaning this test can be used) for the duration of the COVID-19 declaration under Section 564(b)(1) of the Act, 21 U.S.C.section 360bbb-3(b)(1), unless the authorization is terminated  or revoked sooner.        Influenza A by PCR NEGATIVE NEGATIVE Final   Influenza B by PCR NEGATIVE NEGATIVE Final    Comment: (NOTE) The Xpert Xpress SARS-CoV-2/FLU/RSV plus assay is intended as an aid in the diagnosis of influenza from Nasopharyngeal swab specimens and should not be used as a sole basis for treatment. Nasal washings and aspirates are unacceptable for Xpert Xpress SARS-CoV-2/FLU/RSV testing.  Fact Sheet for Patients: EntrepreneurPulse.com.au  Fact Sheet for Healthcare Providers: IncredibleEmployment.be  This test is not yet approved or cleared by the [place] FDA and has  been authorized for detection and/or diagnosis of SARS-CoV-2 by FDA under an Emergency Use Authorization (EUA). This EUA will remain in effect (meaning this test can be used) for the duration of the COVID-19 declaration under Section 564(b)(1) of the Act, 21 U.S.C. section 360bbb-3(b)(1), unless the authorization is terminated or revoked.  Performed at [facility], [place] [address] [place], [place] [address]      Radiological Exams on Admission: CT HEAD WO CONTRAST  Result Date: 02/14/2020 CLINICAL DATA:  80 year old male with weakness and abnormal speech upon waking. EXAM: CT HEAD WITHOUT CONTRAST TECHNIQUE: Contiguous axial images were obtained from the base of the skull through the vertex without intravenous contrast. COMPARISON:  Brain MRI 02/15/2015.  Head CT 06/21/2018. FINDINGS: Brain: Cerebral volume is stable and within normal limits for age. No midline shift, ventriculomegaly, mass effect, evidence of mass lesion, intracranial hemorrhage or evidence of cortically based acute infarction. Patchy fairly mild for age white matter hypodensity in both hemispheres appears stable since 2020. Vascular: Mild Calcified atherosclerosis at the skull base. No suspicious intracranial vascular hyperdensity. Skull: No acute osseous abnormality identified. Sinuses/Orbits: Visualized  paranasal sinuses and mastoids are clear. Other: Stable orbit and scalp soft tissues. IMPRESSION: Stable since 2020 with mild for age cerebral white matter changes. Electronically Signed   By: Genevie Ann M.D.   On: 02/14/2020 07:50    EKG: Independently reviewed.  Sinus rhythm at 70 bpm  Assessment/Plan Gait instability, intermittent dysarthria, and metabolic encephalopathy: Patient presented after being unable to ambulate this morning although normally ambulate without assistance.  Reported feeling like his legs were paralyzed and wife noted him to have slurred speech.  He is normally very sharp, but has been more confused lately.  Initial CT scan of the brain was negative for any acute abnormalities.  Question to symptoms were secondary to a TIA, but patient also noted to have a recent increase in medications of Pristiq.  CTA of the head and neck for negative for any acute large vessel obstruction, and MRI did not show any clear signs of a stroke.  Question if symptoms more likely secondary to the recent dosage change of his Pristiq versus multifactorial in the setting of history of alcohol use -Admit to telemetry bed -Stroke order set initiated -Neuro checks -Hold Hemoglobin A1c and lipid panel -Check echocardiogram -PT/OT/Speech to eval and treat -ASA -Appreciate neurology consultative services, will follow-up for further recommendation  Paroxysmal atrial fibrillation: At this time patient appears to be in sinus rhythm.  At home he had not been on aspirin.  CHA2DS2-VASc score = 3(age and HTN).  However, patient also appears to a high falls risk given reports of falling at least 15 times over the last year. -Goal potassium 4 and magnesium 2 -Check echocardiogram -Follow-up telemetry overnight  Alcohol use: Patient reports drinking 2 glasses of wine nightly.  On admission alcohol level was undetectable.  Patient was noted to be mildly tremulous. Question if this is possibly a sign of alcohol  withdrawal -CIWA protocols initiated with Ativan as needed -Thiamine and multivitamin -Check vitamin B12  Depression: Home medication regimen includes Wellbutrin 300 mg before supper and Pristiq 100 mg before supper.  Pristiq has been increased from 50 mg up to 100mg  in November 2021. -Hold Pristiq and Wellbutrin  Hyperlipidemia -Continue atorvastatin 40 mg nightly   GERD -Continue Protonix 40 mg nightly   DVT prophylaxis: Lovenox Code Status: Full Family Communication:  Wife updated over the phone Disposition Plan: Hopefully discharge home  Consults called: Neurology Admission status: Observation  Norval Morton MD [facility]   If 7PM-7AM, please contact night-coverage   02/14/2020, 9:06 AM

## 2020-02-14 NOTE — Consult Note (Signed)
NEURO HOSPITALIST CONSULT NOTE   Requestig physician: Dr. Tamala Julian  Reason for Consult: New onset of gait instability, intermittent dysarthria and mild confusion  History obtained from:  Patient, Wife and Chart     HPI:                                                                                                                                          Jose Fuentes is an 80 y.o. male with a PMHx of atrial fibrillation, HLD and HTN presenting to the ED with new onset of gait instability, intermittent dysarthria and mild confusion. His gait dysfunction was first noted this AM, while his mild confusion started on Monday. The patient was in his [facility] until Monday, when he got lost while trying to drive a known route to an appointment he had. He was having so much difficulty that he felt the need to call his wife for help with directions. She states that this is a new problem. Shortly after the above episode, he started experiencing a strange sensation with his vision, whereby the new scene he was looking at appeared distorted with lack of distinct boundaries between objects after changing the direction of his gaze. This also has never happened to him before. After gazing in the new direction, his vision would normalize, which he describes as "my vision catching up with the eye movement". This morning, he awoke at about 5 AM to go urinate. He states that while walking to the bathroom, he felt diffusely weak with trouble initiating steps to cross the threshold into the bathroom; he then managed to get back to his bed with a sensation of gait unsteadiness and then called his wife, telling her that he thought he might be having a stroke. Of note, he denies any lateralized weakness or sensory numbness. His wife noted that his speech was overly soft-spoken, garbled and dysarthric, but there was no clear problem with sentence structure or his language comprehension. EMS was called and he was  brought to the ED.   At baseline, he is cognitively intact and does not have a prior history of cognitive, movement or gait issues, except for occasional stumbling that has been observed by his wife. He has no prior history of stroke.   He does not have a formal diagnosis of OSA, but his wife states that he has had episodes of apnea while sleeping followed by a loud snorting sound and resumed respirations for quite some time now. He also snores loudly. She had wanted him to get seen for a sleep study, but he had refused.   He endorses side effect of intermittent teeth chattering when he holds his teeth close together without actually clenching them, since the dose of one of his antidepressants was changed about  3 months ago. Wife denies any abnormal movements during his sleep. The patient denies any history of hallucinations.   Past Medical History:  Diagnosis Date  . Atrial fibrillation ([place])   . Depression   . History of melanoma   . Hyperlipidemia   . Hypertension   . Peyronie disease     Past Surgical History:  Procedure Laterality Date  . KNEE SURGERY Left    meniscal tear  . MELANOMA EXCISION    . Thumb replacement Left   . TONSILLECTOMY      Family History  Problem Relation Age of Onset  . Heart attack Father        died in his sleep age 25  . Kidney failure Mother   . Melanoma Brother   . Melanoma Brother   . Glaucoma Sister   . Cancer Sister        glioblastoma              Social History:  reports that he quit smoking about 51 years ago. He has never used smokeless tobacco. He reports current alcohol use. He reports that he does not use drugs.  No Known Allergies  HOME MEDICATIONS:                                                                                                                     No current facility-administered medications on file prior to encounter.   Current Outpatient Medications on File Prior to Encounter  Medication Sig Dispense Refill   . aspirin EC 81 MG tablet Take 81 mg by mouth daily.    Marland Kitchen buPROPion (WELLBUTRIN XL) 150 MG 24 hr tablet Take 150 mg by mouth daily.    . cholecalciferol (VITAMIN D) 1000 UNITS tablet Take 1,000 Units by mouth 2 (two) times daily.    . hydrochlorothiazide (MICROZIDE) 12.5 MG capsule Take 1 capsule by mouth daily.    Marland Kitchen HYDROcodone-acetaminophen (NORCO/VICODIN) 5-325 MG tablet Take 1 tablet by mouth every 6 (six) hours as needed. 8 tablet 0  . sertraline (ZOLOFT) 100 MG tablet Take 100 mg by mouth daily.     . vitamin B-12 (CYANOCOBALAMIN) 1000 MCG tablet Take 1,000 mcg by mouth daily.        ROS:  As per HPI.    Blood pressure (!) 149/80, pulse 80, temperature 98.2 F (36.8 C), resp. rate 14, height 5\' 6"  (1.676 m), weight 59.9 kg, SpO2 99 %.   General Examination:                                                                                                       Physical Exam  HEENT-  [place]/AT. No neck stiffness.   Lungs- Respirations unlabored  Extremities- No edema  Neurological Examination Mental Status: Awake and alert. Speech is fluent with intact comprehension and naming. Made one minor error when repeating a phrase. Oriented to situation, recent symptoms, the year, city and state, but thought it was "Friday" and that the month was "June". Able to name the months of the year forwards without difficulty, but made 3 errors when reciting them backwards, which was also performed somewhat slowly and hesitantly. Speech was nondysarthric except after increased cognitive load of reciting the months of the year backwards, at which point his utterance of February and January were dysarthric and hypophonic.  Cranial Nerves: II: Visual fields intact with no extinction to DSS. PERRL.   III,IV, VI: Ptosis not present. EOM are conjugate and full, but with saccadic  quality.  V: Facial temp sensation equal bilaterally VII: Smile symmetric. Mild hypomimia noted.  VIII: hearing intact to voice IX,X: Mild hypophonia that fluctuates mildly during the exam XI: Symmetric shoulder shrug XII: Midline tongue extension Motor: Mild cogwheeling of BUE with possible component of paratonia.  Paratonia to BLE noted.  Strength is 5/5 proximally and distally in the BUE and BLE.  Sensory: Pinprick and light touch intact throughout, bilaterally Deep Tendon Reflexes: 2+ and symmetric throughout, although the reflexes are of somewhat low amplitude Plantars: Right: downgoing   Left: upgoing Cerebellar: Mild action tremor with FNF bilaterally. No ataxia noted. Mild/subtle bradykinesia noted.  Gait: Significant instability with prominent retropulsion to the extent that it was unsafe to ambulate the patient without additional PT assistance after standing. He also has difficulty initiating movements to arise from the bed. Stooped posture is noted.    Lab Results: Basic Metabolic Panel: Recent Labs  Lab 02/14/20 0616  NA 135  K 3.6  CL 98  CO2 24  GLUCOSE 95  BUN 12  CREATININE 0.86  CALCIUM 8.9    CBC: Recent Labs  Lab 02/14/20 0616  WBC 4.3  NEUTROABS 2.3  HGB 14.7  HCT 43.8  MCV 93.8  PLT 153    Cardiac Enzymes: No results for input(s): CKTOTAL, CKMB, CKMBINDEX, TROPONINI in the last 168 hours.  Lipid Panel: No results for input(s): CHOL, TRIG, HDL, CHOLHDL, VLDL, LDLCALC in the last 168 hours.  Imaging: CT HEAD WO CONTRAST  Result Date: 02/14/2020 CLINICAL DATA:  80 year old male with weakness and abnormal speech upon waking. EXAM: CT HEAD WITHOUT CONTRAST TECHNIQUE: Contiguous axial images were obtained from the base of the skull through the vertex without intravenous contrast. COMPARISON:  Brain MRI 02/15/2015.  Head CT 06/21/2018. FINDINGS: Brain: Cerebral volume is stable and within normal limits for age.  No midline shift, ventriculomegaly,  mass effect, evidence of mass lesion, intracranial hemorrhage or evidence of cortically based acute infarction. Patchy fairly mild for age white matter hypodensity in both hemispheres appears stable since 2020. Vascular: Mild Calcified atherosclerosis at the skull base. No suspicious intracranial vascular hyperdensity. Skull: No acute osseous abnormality identified. Sinuses/Orbits: Visualized paranasal sinuses and mastoids are clear. Other: Stable orbit and scalp soft tissues. IMPRESSION: Stable since 2020 with mild for age cerebral white matter changes. Electronically Signed   By: Genevie Ann M.D.   On: 02/14/2020 07:50    Assessment: 80 year old male with new onset of gait instability, intermittent dysarthria and mild confusion.  1. CT head with mild atrophy and chronic white matter changes.  2. Exam is suggestive of a Parkinsonian syndrome. Of note, Parkinsonism can be associated with spontaneous fluctuations in motor and cognitive functioning, as well as fluctuations due to intercurrent illness. Of note, Parkinsonism can occur as a rare side effect of antidepressant medication in the elderly.   3. Probable sleep apnea.  4. Stroke is felt to be relatively unlikely, but will obtain stroke work up given his complaint of BLE weakness. Stroke risk factors include his age, HLD, HTN and atrial fibrillation.  5. Symptom of teeth chattering after increased dose of antidepressant. This is a known side effect.   Recommendations: 1. MRI brain, TTE, CTA of head and neck, cardiac telemetry.  2. He is at high risk for falls and risks of anticoagulation for his atrial fibrillation may outweigh benefits. Continue ASA.  3. Will need outpatient Neurology follow up for sleep study to assess probable OSA.  4. Will need outpatient Movement Disorders follow up. Recommend a tertiary care medical facility such as [facility] for Movement Disorders assessment.  5. PT for detailed assessment of his ambulation 6. Hold his  Wellbutrin and Zoloft for now.    Electronically signed: Dr. Kerney Elbe 02/14/2020, 9:36 AM

## 2020-02-14 NOTE — ED Notes (Signed)
Patient transported to CT 

## 2020-02-14 NOTE — ED Triage Notes (Signed)
Pt arrived via ems from home due to generalized weakness. Pt states he woke up this morning with a paralyzed feeling, states he feels like he could not walk. Wife reported to ems pt was having difficulty forming sentences. Ems states pt was able to ambulate upon arrival.

## 2020-02-15 ENCOUNTER — Observation Stay (HOSPITAL_BASED_OUTPATIENT_CLINIC_OR_DEPARTMENT_OTHER): Payer: Medicare Other

## 2020-02-15 ENCOUNTER — Encounter (HOSPITAL_COMMUNITY): Payer: Self-pay | Admitting: Internal Medicine

## 2020-02-15 DIAGNOSIS — K219 Gastro-esophageal reflux disease without esophagitis: Secondary | ICD-10-CM | POA: Diagnosis not present

## 2020-02-15 DIAGNOSIS — R2681 Unsteadiness on feet: Secondary | ICD-10-CM | POA: Diagnosis not present

## 2020-02-15 DIAGNOSIS — I4891 Unspecified atrial fibrillation: Secondary | ICD-10-CM | POA: Diagnosis not present

## 2020-02-15 DIAGNOSIS — G459 Transient cerebral ischemic attack, unspecified: Secondary | ICD-10-CM | POA: Diagnosis not present

## 2020-02-15 LAB — CBC
HCT: 41 % (ref 39.0–52.0)
Hemoglobin: 14.1 g/dL (ref 13.0–17.0)
MCH: 32.2 pg (ref 26.0–34.0)
MCHC: 34.4 g/dL (ref 30.0–36.0)
MCV: 93.6 fL (ref 80.0–100.0)
Platelets: 133 10*3/uL — ABNORMAL LOW (ref 150–400)
RBC: 4.38 MIL/uL (ref 4.22–5.81)
RDW: 14.6 % (ref 11.5–15.5)
WBC: 4.1 10*3/uL (ref 4.0–10.5)
nRBC: 0 % (ref 0.0–0.2)

## 2020-02-15 LAB — BASIC METABOLIC PANEL
Anion gap: 10 (ref 5–15)
BUN: 12 mg/dL (ref 8–23)
CO2: 25 mmol/L (ref 22–32)
Calcium: 8.9 mg/dL (ref 8.9–10.3)
Chloride: 101 mmol/L (ref 98–111)
Creatinine, Ser: 1 mg/dL (ref 0.61–1.24)
GFR, Estimated: >60 mL/min (ref >60)
Glucose, Bld: 95 mg/dL (ref 70–99)
Potassium: 3.5 mmol/L (ref 3.5–5.1)
Sodium: 136 mmol/L (ref 135–145)

## 2020-02-15 LAB — ECHOCARDIOGRAM COMPLETE
Area-P 1/2: 3.77 cm2
Height: 66 in
S' Lateral: 2.9 cm
Weight: 2112 oz

## 2020-02-15 LAB — PHOSPHORUS: Phosphorus: 4 mg/dL (ref 2.5–4.6)

## 2020-02-15 LAB — MAGNESIUM: Magnesium: 1.7 mg/dL (ref 1.7–2.4)

## 2020-02-15 MED ORDER — THIAMINE HCL 100 MG PO TABS: 100.0000 mg | ORAL_TABLET | Freq: Every day | ORAL | 0 refills | Status: AC

## 2020-02-15 MED ORDER — FOLIC ACID 1 MG PO TABS: 1.0000 mg | ORAL_TABLET | Freq: Every day | ORAL | 0 refills | Status: AC

## 2020-02-15 MED ORDER — ASPIRIN EC 81 MG PO TBEC: 81.0000 mg | DELAYED_RELEASE_TABLET | Freq: Every day | ORAL | 2 refills | Status: AC

## 2020-02-15 MED ORDER — DESVENLAFAXINE SUCCINATE ER 25 MG PO TB24: 75.0000 mg | ORAL_TABLET | Freq: Every day | ORAL | 0 refills | Status: AC

## 2020-02-15 MED ORDER — ADULT MULTIVITAMIN W/MINERALS CH: 1.0000 | ORAL_TABLET | Freq: Every day | ORAL | 0 refills | Status: AC

## 2020-02-15 NOTE — Discharge Summary (Signed)
[facility] Discharge Summary   Patient: Jose Fuentes VOJ:[id number]  PCP: Jose Stains, MD  Date of admission: 02/14/2020   Date of discharge: 02/15/2020     Discharge Diagnoses:  Principal Problem:   TIA (transient ischemic attack) Active Problems:   AF (paroxysmal atrial fibrillation) (HCC)   Gait instability   Alcohol use   Depression   GERD (gastroesophageal reflux disease)   Admitted From: home Disposition:  Home with home health  Recommendations for Outpatient Follow-up:  1. PCP: Follow-up with PCP in 1 week 2. Follow up LABS/TEST: None   Follow-up Information    Jose Stains, MD. Schedule an appointment as soon as possible for a visit in 1 week(s).   Specialty: Family Medicine Contact information: [place] [address] [place], [facility] up.   Specialty: Home Health Services Why: The home health agency will contact you for the first home visit. Contact information: [place] [address] [phone]              Discharge Instructions    Diet - low sodium heart healthy   Complete by: As directed    Discharge instructions   Complete by: As directed    It is important that you read the instructions as well as go over your medication list with RN to help you understand your care after this hospitalization.  Please follow-up with PCP in 1-2 weeks.  Please note that we are unable to authorize any refills for discharge medications, once you are discharged. Thus, it is imperative that you return to your primary care physician (or establish a relationship with a primary care physician if you do not have one) for your care needs. So that they can reassess your need for medications and monitor your lab values.  Please request your primary care physician to go over all Hospital Tests and Procedure/Radiological results at the follow up. Please get all Hospital records  sent to your PCP by signing hospital release before you go home.   Do not drive, operating heavy machinery, perform activities at heights, swimming or participation in water activities or provide baby sitting services; until you have been seen by Primary Care Physician and are cleared to do such activities.  Do not take more than prescribed Pain, Sleep and Anxiety Medications.  You were cared for by a hospitalist during your hospital stay. If you have any questions about your discharge medications or the care you received while you were in the hospital after you are discharged, you can call the hospital unit/floor you were admitted to and ask to speak with the hospitalist who took care of you. Ask for Hospitalist on call, if the hospitalist that took care of you is not available. Once you are discharged, your primary care physician will help you with any further medical issues. You Must read complete instructions/literature along with all the possible adverse reactions/side effects for all the Medicines you take and that have been prescribed to you. Take any new Medicines after you have completely understood and accept all the possible adverse reactions/side effects. If you have smoked or chewed Tobacco, please STOP smoking. If you drink alcohol, please safely reduce the use. Do not drive, operating heavy machinery, perform activities at heights, swimming or participation in water activities or provide baby sitting services under influence.   Increase activity slowly   Complete by: As directed  Diet recommendation: Cardiac diet  Activity: The patient is advised to gradually reintroduce usual activities, as tolerated  Discharge Condition: stable  Code Status: Full code   History of present illness: As per the H and P dictated on admission, "Jose Fuentes is a 80 y.o. male with medical history significant of hypertension, hyperlipidemia, atrial fibrillation, and depression presents  with complaints of confusion, change in speech, and inability to walk.  History is taken from the patient and his wife over the phone.  He tried to get up out of bed this morning around 5 AM to go use the bathroom, but after taking 1 step was unable to move.  Patient notes that his legs were too weak  and he could not move another step.  He was able to sit back down and did not fall.  Patient called down to his wife who came upstairs and noticed that his speech was slurred and he was not making sense.  Patient complained of his legs feeling paralyzed.  Earlier this week on Monday he had gotten lost while trying to go to the dentist office in [place] where he spent at least 40 time cystoscopy and ended up in [facility].  His wife had ended up coming to get him.  For the last 3 days or so he had been waking up and complaining of feeling dizzy like the room spinning around him. Normally he is able to get around without assistance and utilizes a cane when he is out in the garden.  Patient has had no problems with his memory in the past.  Patient reports that over the last year, however that he has had at least 15 falls.  Pristiq had been increased from 50 mg daily up to 100 mg daily back in November as per the patient's wife.  He denies having any palpitations or chest pain symptoms.  Patient feels like he knows when he is in atrial fibrillation reports that he has not been in this in over 10 years possibly.  He does admit to normally drinking 2 glasses wine with his wife daily."  Hospital Course:  Summary of his active problems in the hospital is as following.   Gait instability, intermittent dysarthria, Acute metabolic encephalopathy:  Reported feeling like his legs were paralyzed and wife noted him to have slurred speech.   Head CT scan of the brain was negative for any acute abnormalities. patient also noted to have a recent increase in medications of Pristiq.   CTA of the head and neck for negative for  any acute large vessel obstruction, and MRI did not show any clear signs of a stroke.  Neurology was consulted. Echocardiogram 55 to 60% EF. No medication changes recommended for stroke risk factor. Symptoms were felt secondary to SSRI dose change. Pristiq dose reduced from 100 mg to 75 mg on discharge.  Paroxysmal atrial fibrillation:  Currently sinus. Not a good candidate for anticoagulation given his fall risk. Echocardiogram showed preserved EF.  Alcohol use:  Patient reports drinking 2 glasses of wine nightly.   On admission alcohol level was undetectable.  Patient was noted to be mildly tremulous.  Question if this is possibly a sign of alcohol withdrawal Continue thiamine and multivitamin  Depression:  Home medication regimen includes Wellbutrin 300 mg before supper and Pristiq 100 mg before supper.   Pristiq has been increased from 50 mg up to 100mg  in November 2021. Continue Wellbutrin. Reduce Pristiq dose from 100 mg to 75 mg.  Hyperlipidemia -Continue atorvastatin 40 mg nightly   GERD -Continue Protonix 40 mg nightly  Patient was seen by physical therapy, who recommended Home Health, . On the day of the discharge the patient's vitals were stable, and no other acute medical condition were reported by patient. The patient was felt safe to be discharge at Home with Home health.  Consultants: Neurology  Procedures: none  DISCHARGE MEDICATION: Allergies as of 02/15/2020      Reactions   Azithromycin Diarrhea   Escitalopram Oxalate Other (See Comments)   Worsening mood swings   Fluoxetine Hcl Other (See Comments)   Worsening mood swings      Medication List    STOP taking these medications   hydrochlorothiazide 12.5 MG capsule Commonly known as: MICROZIDE   HYDROcodone-acetaminophen 5-325 MG tablet Commonly known as: NORCO/VICODIN     TAKE these medications   aspirin EC 81 MG tablet Take 1 tablet (81 mg total) by mouth daily. Swallow whole.    atorvastatin 40 MG tablet Commonly known as: LIPITOR Take 40 mg by mouth daily with supper.   buPROPion 300 MG 24 hr tablet Commonly known as: WELLBUTRIN XL Take 300 mg by mouth daily before supper.   Desvenlafaxine Succinate ER 25 MG Tb24 Take 75 mg by mouth daily. What changed:   medication strength  how much to take  when to take this   folic acid 1 MG tablet Commonly known as: FOLVITE Take 1 tablet (1 mg total) by mouth daily. Start taking on: February 16, 2020   GLUCOSAMINE-CHONDROITIN PO Take 2 tablets by mouth daily with supper.   multivitamin with minerals Tabs tablet Take 1 tablet by mouth daily. Start taking on: February 16, 2020   pantoprazole 40 MG tablet Commonly known as: PROTONIX Take 40 mg by mouth daily before supper.   thiamine 100 MG tablet Take 1 tablet (100 mg total) by mouth daily. Start taking on: February 16, 2020   vitamin B-12 1000 MCG tablet Commonly known as: CYANOCOBALAMIN Take 1,000 mcg by mouth daily with supper.   Vitamin D-3 25 MCG (1000 UT) Caps Take 1,000 Units by mouth daily with supper.            Durable Medical Equipment  (From admission, onward)         Start     Ordered   02/15/20 1117  For home use only DME Walker rolling  Once       Question Answer Comment  Walker: With Neibert   Patient needs a walker to treat with the following condition Physical deconditioning      02/15/20 1116   02/15/20 1117  For home use only DME 3 n 1  Once        02/15/20 1116   02/15/20 1030  For home use only DME Walker rolling  Once       Question Answer Comment  Walker: With [place]   Patient needs a walker to treat with the following condition Weakness      02/15/20 1030          Discharge Exam: [facility] Weights   02/14/20 0618 02/14/20 1625  Weight: 59.9 kg 59.9 kg   Vitals:   02/15/20 0355 02/15/20 0751  BP: 139/85 134/82  Pulse: 71 84  Resp: 19 18  Temp: 97.8 F (36.6 C) 98.5 F (36.9 C)   SpO2: 98% 97%   General: Appear in no distress, no Rash; Oral Mucosa Clear, moist. no Abnormal Neck  Mass Or lumps, Conjunctiva normal  Cardiovascular: S1 and S2 Present, no Murmur Respiratory: good respiratory effort, Bilateral Air entry present and CTA, no Crackles, no wheezes Abdomen: Bowel Sound present, Soft and no tenderness Extremities: no Pedal edema Neurology: alert and oriented to time, place, and person affect appropriate. no new focal deficit  The results of significant diagnostics from this hospitalization (including imaging, microbiology, ancillary and laboratory) are listed below for reference.    Significant Diagnostic Studies: CT Angio Head W or Wo Contrast  Result Date: 02/14/2020 CLINICAL DATA:  TIA. New onset gait instability, intermittent dysarthria, and mild confusion. EXAM: CT ANGIOGRAPHY HEAD AND NECK TECHNIQUE: Multidetector CT imaging of the head and neck was performed using the standard protocol during bolus administration of intravenous contrast. Multiplanar CT image reconstructions and MIPs were obtained to evaluate the vascular anatomy. Carotid stenosis measurements (when applicable) are obtained utilizing NASCET criteria, using the distal internal carotid diameter as the denominator. CONTRAST:  1mL OMNIPAQUE IOHEXOL 300 MG/ML  SOLN COMPARISON:  None. FINDINGS: CTA NECK FINDINGS Aortic arch: Standard 3 vessel aortic arch with mild atherosclerotic plaque. Widely patent arch vessel origins. Right carotid system: Patent without evidence of stenosis, dissection, or significant atherosclerosis. Left carotid system: Patent with minimal calcified plaque at the carotid bifurcation. No evidence of stenosis or dissection. Vertebral arteries: Patent without evidence of stenosis, dissection, or significant atherosclerosis. Codominant. Skeleton: Advanced disc degeneration from C3-4 to C6-7. Advanced diffuse cervical facet arthrosis. Other neck: No evidence of cervical  lymphadenopathy. 6 mm well-defined hypodensity in the anteroinferior aspect of the left submandibular gland, too small to fully characterize though with its CT appearance as well as T1 hypointensity on MRI potentially indicative of a small cyst. Upper chest: Mild scarring in the lung apices. Review of the MIP images confirms the above findings CTA HEAD FINDINGS Anterior circulation: The internal carotid arteries are widely patent from skull base to carotid termini. ACAs and MCAs are patent without evidence of a proximal branch occlusion or significant proximal stenosis. No aneurysm is identified. Posterior circulation: The intracranial vertebral arteries are widely patent to the basilar. Patent bilateral PICA, right AICA, and bilateral SCA origins are identified. The left AICA is small with its origin poorly evaluated. The basilar artery is patent and mildly small in caliber diffusely on a congenital basis without evidence of a significant focal stenosis. There is a fetal origin of the left PCA. Both PCAs are patent without evidence of a significant proximal stenosis. No aneurysm is identified. Venous sinuses: Patent. Anatomic variants: Fetal left PCA. Review of the MIP images confirms the above findings IMPRESSION: 1. Minimal atherosclerosis without large vessel occlusion or significant stenosis in the head or neck. 2. Aortic Atherosclerosis (ICD10-I70.0). Electronically Signed   By: Logan Bores M.D.   On: 02/14/2020 12:10   DG Chest 2 View  Result Date: 02/14/2020 CLINICAL DATA:  Speech difficulty. EXAM: CHEST - 2 VIEW COMPARISON:  None. FINDINGS: The heart size and mediastinal contours are within normal limits. Both lungs are clear. The visualized skeletal structures are unremarkable. IMPRESSION: No active cardiopulmonary disease. Electronically Signed   By: Marijo Conception M.D.   On: 02/14/2020 09:56   CT HEAD WO CONTRAST  Result Date: 02/14/2020 CLINICAL DATA:  80 year old male with weakness and  abnormal speech upon waking. EXAM: CT HEAD WITHOUT CONTRAST TECHNIQUE: Contiguous axial images were obtained from the base of the skull through the vertex without intravenous contrast. COMPARISON:  Brain MRI 02/15/2015.  Head CT 06/21/2018. FINDINGS: Brain:  Cerebral volume is stable and within normal limits for age. No midline shift, ventriculomegaly, mass effect, evidence of mass lesion, intracranial hemorrhage or evidence of cortically based acute infarction. Patchy fairly mild for age white matter hypodensity in both hemispheres appears stable since 2020. Vascular: Mild Calcified atherosclerosis at the skull base. No suspicious intracranial vascular hyperdensity. Skull: No acute osseous abnormality identified. Sinuses/Orbits: Visualized paranasal sinuses and mastoids are clear. Other: Stable orbit and scalp soft tissues. IMPRESSION: Stable since 2020 with mild for age cerebral white matter changes. Electronically Signed   By: Genevie Ann M.D.   On: 02/14/2020 07:50   CT Angio Neck W and/or Wo Contrast  Result Date: 02/14/2020 CLINICAL DATA:  TIA. New onset gait instability, intermittent dysarthria, and mild confusion. EXAM: CT ANGIOGRAPHY HEAD AND NECK TECHNIQUE: Multidetector CT imaging of the head and neck was performed using the standard protocol during bolus administration of intravenous contrast. Multiplanar CT image reconstructions and MIPs were obtained to evaluate the vascular anatomy. Carotid stenosis measurements (when applicable) are obtained utilizing NASCET criteria, using the distal internal carotid diameter as the denominator. CONTRAST:  85mL OMNIPAQUE IOHEXOL 300 MG/ML  SOLN COMPARISON:  None. FINDINGS: CTA NECK FINDINGS Aortic arch: Standard 3 vessel aortic arch with mild atherosclerotic plaque. Widely patent arch vessel origins. Right carotid system: Patent without evidence of stenosis, dissection, or significant atherosclerosis. Left carotid system: Patent with minimal calcified plaque at the  carotid bifurcation. No evidence of stenosis or dissection. Vertebral arteries: Patent without evidence of stenosis, dissection, or significant atherosclerosis. Codominant. Skeleton: Advanced disc degeneration from C3-4 to C6-7. Advanced diffuse cervical facet arthrosis. Other neck: No evidence of cervical lymphadenopathy. 6 mm well-defined hypodensity in the anteroinferior aspect of the left submandibular gland, too small to fully characterize though with its CT appearance as well as T1 hypointensity on MRI potentially indicative of a small cyst. Upper chest: Mild scarring in the lung apices. Review of the MIP images confirms the above findings CTA HEAD FINDINGS Anterior circulation: The internal carotid arteries are widely patent from skull base to carotid termini. ACAs and MCAs are patent without evidence of a proximal branch occlusion or significant proximal stenosis. No aneurysm is identified. Posterior circulation: The intracranial vertebral arteries are widely patent to the basilar. Patent bilateral PICA, right AICA, and bilateral SCA origins are identified. The left AICA is small with its origin poorly evaluated. The basilar artery is patent and mildly small in caliber diffusely on a congenital basis without evidence of a significant focal stenosis. There is a fetal origin of the left PCA. Both PCAs are patent without evidence of a significant proximal stenosis. No aneurysm is identified. Venous sinuses: Patent. Anatomic variants: Fetal left PCA. Review of the MIP images confirms the above findings IMPRESSION: 1. Minimal atherosclerosis without large vessel occlusion or significant stenosis in the head or neck. 2. Aortic Atherosclerosis (ICD10-I70.0). Electronically Signed   By: Logan Bores M.D.   On: 02/14/2020 12:10   MR BRAIN WO CONTRAST  Result Date: 02/14/2020 CLINICAL DATA:  TIA.  Slurred speech and difficulty walking. EXAM: MRI HEAD WITHOUT CONTRAST TECHNIQUE: Multiplanar, multiecho pulse  sequences of the brain and surrounding structures were obtained without intravenous contrast. COMPARISON:  MRI December 15, 2015.  CT head from the same day. FINDINGS: Brain: No acute infarction, hemorrhage, hydrocephalus, extra-axial collection or mass lesion. Similar tiny remote right pontine infarct versus dilated perivascular space. Similar scattered T2/FLAIR hyperintensities within the white matter, most likely related to chronic microvascular ischemic disease. Similar generalized  cerebral volume loss with ex vacuo ventricular dilation. Vascular: Major arterial flow voids are maintained at the skull base. Skull and upper cervical spine: Normal marrow signal. Progression of degenerative pannus along the dorsal dens. Sinuses/Orbits: Mild paranasal sinus mucosal thickening without air-fluid levels. Unremarkable orbits. Other: No mastoid effusions. IMPRESSION: 1. No acute intracranial abnormality.  No acute infarct. 2. Similar chronic findings, including tiny remote right pontine infarct versus dilated perivascular space and chronic microvascular ischemic disease. Electronically Signed   By: Margaretha Sheffield MD   On: 02/14/2020 10:33   ECHOCARDIOGRAM COMPLETE  Result Date: 02/15/2020    ECHOCARDIOGRAM REPORT   Patient Name:   BENEDICT KUE [facility] Date of Exam: 02/15/2020 Medical Rec #:  [id number]           Height:       66.0 in Accession #:    [id number]          Weight:       132.0 lb Date of Birth:  [birth year]            BSA:          1.676 m Patient Age:    30 years            BP:           134/82 mmHg Patient Gender: M                   HR:           78 bpm. Exam Location:  Inpatient Procedure: 2D Echo Indications:    Atrial Fibrillation I48.91  History:        Patient has no prior history of Echocardiogram examinations.                 Arrythmias:Atrial Fibrillation; Risk Factors:Hypertension and                 Dyslipidemia.  Sonographer:    Mikki Santee RDCS (AE) Referring Phys: [id number] RONDELL A  SMITH IMPRESSIONS  1. Left ventricular ejection fraction, by estimation, is 55 to 60%. The left ventricle has normal function. The left ventricle has no regional wall motion abnormalities. Left ventricular diastolic parameters are consistent with Grade I diastolic dysfunction (impaired relaxation).  2. Right ventricular systolic function is normal. The right ventricular size is normal.  3. The mitral valve is grossly normal. No evidence of mitral valve regurgitation.  4. The aortic valve was not well visualized. Aortic valve regurgitation is not visualized. No aortic stenosis is present.  5. The inferior vena cava is normal in size with greater than 50% respiratory variability, suggesting right atrial pressure of 3 mmHg. Comparison(s): No prior Echocardiogram. FINDINGS  Left Ventricle: Left ventricular ejection fraction, by estimation, is 55 to 60%. The left ventricle has normal function. The left ventricle has no regional wall motion abnormalities. The left ventricular internal cavity size was normal in size. There is  no left ventricular hypertrophy. Left ventricular diastolic parameters are consistent with Grade I diastolic dysfunction (impaired relaxation). Normal left ventricular filling pressure. Right Ventricle: The right ventricular size is normal. No increase in right ventricular wall thickness. Right ventricular systolic function is normal. Left Atrium: Left atrial size was normal in size. Right Atrium: Right atrial size was normal in size. Pericardium: There is no evidence of pericardial effusion. Mitral Valve: The mitral valve is grossly normal. No evidence of mitral valve regurgitation. Tricuspid Valve: The tricuspid valve is grossly normal. Tricuspid valve regurgitation is trivial. Aortic  Valve: The aortic valve was not well visualized. Aortic valve regurgitation is not visualized. No aortic stenosis is present. Pulmonic Valve: The pulmonic valve was normal in structure. Pulmonic valve regurgitation is  not visualized. Aorta: The aortic root is normal in size and structure. Venous: The inferior vena cava is normal in size with greater than 50% respiratory variability, suggesting right atrial pressure of 3 mmHg. IAS/Shunts: No atrial level shunt detected by color flow Doppler.  LEFT VENTRICLE PLAX 2D LVIDd:         4.10 cm  Diastology LVIDs:         2.90 cm  LV e' medial:    7.51 cm/s LV PW:         1.00 cm  LV E/e' medial:  9.7 LV IVS:        1.00 cm  LV e' lateral:   10.60 cm/s LVOT diam:     2.20 cm  LV E/e' lateral: 6.9 LV SV:         70 LV SV Index:   42 LVOT Area:     3.80 cm  RIGHT VENTRICLE RV S prime:     15.00 cm/s TAPSE (M-mode): 2.0 cm LEFT ATRIUM             Index       RIGHT ATRIUM           Index LA diam:        3.20 cm 1.91 cm/m  RA Area:     13.10 cm LA Vol (A2C):   25.7 ml 15.33 ml/m RA Volume:   26.90 ml  16.05 ml/m LA Vol (A4C):   40.8 ml 24.34 ml/m LA Biplane Vol: 35.5 ml 21.18 ml/m  AORTIC VALVE LVOT Vmax:   79.50 cm/s LVOT Vmean:  55.000 cm/s LVOT VTI:    0.185 m  AORTA Ao Root diam: 3.30 cm MITRAL VALVE MV Area (PHT): 3.77 cm    SHUNTS MV Decel Time: 201 msec    Systemic VTI:  0.18 m MV E velocity: 72.80 cm/s  Systemic Diam: 2.20 cm MV A velocity: 88.30 cm/s MV E/A ratio:  0.82 Lyman Bishop MD Electronically signed by Lyman Bishop MD Signature Date/Time: 02/15/2020/11:25:27 AM    Final     Microbiology: Recent Results (from the past 240 hour(s))  Resp Panel by RT-PCR (Flu A&B, Covid) Nasopharyngeal Swab     Status: None   Collection Time: 02/14/20  6:47 AM   Specimen: Nasopharyngeal Swab; Nasopharyngeal(NP) swabs in vial transport medium  Result Value Ref Range Status   SARS Coronavirus 2 by RT PCR NEGATIVE NEGATIVE Final    Comment: (NOTE) SARS-CoV-2 target nucleic acids are NOT DETECTED.  The SARS-CoV-2 RNA is generally detectable in upper respiratory specimens during the acute phase of infection. The lowest concentration of SARS-CoV-2 viral copies this assay can  detect is 138 copies/mL. A negative result does not preclude SARS-Cov-2 infection and should not be used as the sole basis for treatment or other patient management decisions. A negative result may occur with  improper specimen collection/handling, submission of specimen other than nasopharyngeal swab, presence of viral mutation(s) within the areas targeted by this assay, and inadequate number of viral copies(<138 copies/mL). A negative result must be combined with clinical observations, patient history, and epidemiological information. The expected result is Negative.  Fact Sheet for Patients:  EntrepreneurPulse.com.au  Fact Sheet for Healthcare Providers:  IncredibleEmployment.be  This test is no t yet approved or cleared by the [place]  FDA and  has been authorized for detection and/or diagnosis of SARS-CoV-2 by FDA under an Emergency Use Authorization (EUA). This EUA will remain  in effect (meaning this test can be used) for the duration of the COVID-19 declaration under Section 564(b)(1) of the Act, 21 U.S.C.section 360bbb-3(b)(1), unless the authorization is terminated  or revoked sooner.       Influenza A by PCR NEGATIVE NEGATIVE Final   Influenza B by PCR NEGATIVE NEGATIVE Final    Comment: (NOTE) The Xpert Xpress SARS-CoV-2/FLU/RSV plus assay is intended as an aid in the diagnosis of influenza from Nasopharyngeal swab specimens and should not be used as a sole basis for treatment. Nasal washings and aspirates are unacceptable for Xpert Xpress SARS-CoV-2/FLU/RSV testing.  Fact Sheet for Patients: EntrepreneurPulse.com.au  Fact Sheet for Healthcare Providers: IncredibleEmployment.be  This test is not yet approved or cleared by the [place] FDA and has been authorized for detection and/or diagnosis of SARS-CoV-2 by FDA under an Emergency Use Authorization (EUA). This EUA will remain in  effect (meaning this test can be used) for the duration of the COVID-19 declaration under Section 564(b)(1) of the Act, 21 U.S.C. section 360bbb-3(b)(1), unless the authorization is terminated or revoked.  Performed at [facility], [place] [address] [place], [place] [address]      Labs: CBC: Recent Labs  Lab 02/14/20 0616 02/15/20 0240  WBC 4.3 4.1  NEUTROABS 2.3  --   HGB 14.7 14.1  HCT 43.8 41.0  MCV 93.8 93.6  PLT 153 573*   Basic Metabolic Panel: Recent Labs  Lab 02/14/20 0616 02/15/20 0240  NA 135 136  K 3.6 3.5  CL 98 101  CO2 24 25  GLUCOSE 95 95  BUN 12 12  CREATININE 0.86 1.00  CALCIUM 8.9 8.9  MG  --  1.7  PHOS  --  4.0   Liver Function Tests: Recent Labs  Lab 02/14/20 0616  AST 25  ALT 19  ALKPHOS 80  BILITOT 1.0  PROT 6.5  ALBUMIN 3.7   CBG: No results for input(s): GLUCAP in the last 168 hours.  Time spent: 35 minutes  Signed:  Berle Mull  [facility] 02/15/2020 6:26 PM

## 2020-02-15 NOTE — Progress Notes (Signed)
Physical Therapy Treatment Patient Details Name: Jose Fuentes MRN: [id number] DOB: [birth year] Today's Date: 02/15/2020    History of Present Illness Pt is an 80 y/o male admitted secondary to gait instability, dysarthria, and confusion. Per neuro notes suggestive of a Parkinsonian syndrome. PMH includes a fib, HTN, and depression.    PT Comments    Pt with much improved stability and mobility tolerance this session. Able to increase ambulation distance and required min guard A with use of RW. Pt reports feeling much better than he did yesterday. Given good mobility progression, updated recommendations to home with HHPT at d/c. Will continue to follow acutely to maximize functional mobility independence and safety.     Follow Up Recommendations  Home health PT;Supervision for mobility/OOB     Equipment Recommendations  Rolling walker with 5" wheels;3in1 (PT)    Recommendations for Other Services       Precautions / Restrictions Precautions Precautions: Fall Restrictions Weight Bearing Restrictions: No    Mobility  Bed Mobility               General bed mobility comments: Sitting EOB upon entry    Transfers Overall transfer level: Needs assistance Equipment used: Rolling walker (2 wheeled) Transfers: Sit to/from Stand Sit to Stand: Min guard         General transfer comment: Min guard for safety. No posterior lean noted this session.  Ambulation/Gait Ambulation/Gait assistance: Min guard Gait Distance (Feet): 110 Feet Assistive device: Rolling walker (2 wheeled) Gait Pattern/deviations: Step-through pattern;Decreased stride length Gait velocity: Decreased   General Gait Details: Pt with much improved stability during gait. Able to ambulate longer distance using RW and min guard A for safety. No LOB ntoed.   Stairs             Wheelchair Mobility    Modified Rankin (Stroke Patients Only)       Balance Overall balance assessment: Needs  assistance Sitting-balance support: No upper extremity supported Sitting balance-Leahy Scale: Good     Standing balance support: Bilateral upper extremity supported;No upper extremity supported;During functional activity Standing balance-Leahy Scale: Fair Standing balance comment: Able to maintain static standing without UE support.                            Cognition Arousal/Alertness: Awake/alert Behavior During Therapy: Flat affect Overall Cognitive Status: Impaired/Different from baseline Area of Impairment: Safety/judgement;Problem solving                         Safety/Judgement: Decreased awareness of safety   Problem Solving: Slow processing General Comments: Pt continues to demonstrate decreased safety awareness as he reports he continues to try and get up alone to "see how he is doing".      Exercises      General Comments        Pertinent Vitals/Pain Pain Assessment: No/denies pain    Home Living                      Prior Function            PT Goals (current goals can now be found in the care plan section) Acute Rehab PT Goals Patient Stated Goal: to be independent PT Goal Formulation: With patient Time For Goal Achievement: 02/28/20 Potential to Achieve Goals: Good Progress towards PT goals: Progressing toward goals    Frequency    Min 3X/week  PT Plan Discharge plan needs to be updated    Co-evaluation              AM-PAC PT "6 Clicks" Mobility   Outcome Measure  Help needed turning from your back to your side while in a flat bed without using bedrails?: None Help needed moving from lying on your back to sitting on the side of a flat bed without using bedrails?: A Little Help needed moving to and from a bed to a chair (including a wheelchair)?: A Little Help needed standing up from a chair using your arms (e.g., wheelchair or bedside chair)?: A Little Help needed to walk in hospital room?: A  Little Help needed climbing 3-5 steps with a railing? : A Lot 6 Click Score: 18    End of Session Equipment Utilized During Treatment: Gait belt Activity Tolerance: Patient tolerated treatment well Patient left: in bed;with call bell/phone within reach;Other (comment);with bed alarm set (with transport staff present) Nurse Communication: Mobility status PT Visit Diagnosis: Other symptoms and signs involving the nervous system (R29.898);Difficulty in walking, not elsewhere classified (R26.2)     Time: 2111-5520 PT Time Calculation (min) (ACUTE ONLY): 16 min  Charges:  $Gait Training: 8-22 mins                     Jose Fuentes, DPT  Acute Rehabilitation Services  Pager: [phone] Office: [phone]    Jose Fuentes 02/15/2020, 9:23 AM

## 2020-02-15 NOTE — Evaluation (Signed)
Occupational Therapy Evaluation Patient Details Name: Jose Fuentes MRN: [id number] DOB: 05-28-1940 Today's Date: 02/15/2020    History of Present Illness Pt is an 80 y/o male admitted secondary to gait instability, dysarthria, and confusion. Per neuro notes suggestive of a Parkinsonian syndrome. PMH includes a fib, HTN, and depression.   Clinical Impression   PTA patient was living with his spouse in a private residence and was requiring intermittent use of walking stick at baseline. Patient with significant improvement in function from PT evaluation yesterday. This date patient able to ambulate to closet to retreive overnight bag, don UB/LB clothing with I and complete 3/3 parts of toileting task and hand hygiene standing at sink level with I. Patient notes feeling "fuzzy" with a mild headache but otherwise feels close to his baseline level of function during self-care tasks and ADL transfers. Patient notes numbness and tingling in 2nd digit of L hand from previous laceration injury causing nerve damage with noted difficulty tying shoelaces and buttoning shirt although patient able to complete both tasks with increased time and no need for external assist. Education on AE including elastic shoelaces vs loafers without laces if tying shoes becomes increasingly difficulty with diagnosis of PD. Patient expressed verbal understanding. Patient does not require continued acute occupational therapy services with OT to sign off at this time.     Follow Up Recommendations  No OT follow up    Equipment Recommendations  None recommended by OT    Recommendations for Other Services       Precautions / Restrictions Precautions Precautions: Fall Restrictions Weight Bearing Restrictions: No      Mobility Bed Mobility Overal bed mobility: Independent             General bed mobility comments: Sitting EOB upon entry    Transfers Overall transfer level: Independent Equipment used:  Rolling walker (2 wheeled) Transfers: Sit to/from Stand Sit to Stand: Min guard         General transfer comment: Patient able to walk around room with I. No AD needed. Able to carry overnight bag from closet to bed surface without LOB. No difficulty noted.    Balance Overall balance assessment: Needs assistance Sitting-balance support: No upper extremity supported Sitting balance-Leahy Scale: Good     Standing balance support: Bilateral upper extremity supported;No upper extremity supported;During functional activity Standing balance-Leahy Scale: Fair Standing balance comment: Able to maintain static standing without UE support.                           ADL either performed or assessed with clinical judgement   ADL Overall ADL's : At baseline                                       General ADL Comments: Patient able to don UB/LB clothing with I. Completes toilet transfer and grooming standing at sink level with I.     Vision   Vision Assessment?: No apparent visual deficits     Perception     Praxis      Pertinent Vitals/Pain Pain Assessment: No/denies pain     Hand Dominance Right   Extremity/Trunk Assessment Upper Extremity Assessment Upper Extremity Assessment: Overall WFL for tasks assessed   Lower Extremity Assessment Lower Extremity Assessment: Generalized weakness   Cervical / Trunk Assessment Cervical / Trunk Assessment: Kyphotic   Communication  Communication Communication: No difficulties   Cognition Arousal/Alertness: Awake/alert Behavior During Therapy: Flat affect Overall Cognitive Status: Within Functional Limits for tasks assessed Area of Impairment: Safety/judgement;Problem solving                         Safety/Judgement: Decreased awareness of safety   Problem Solving: Slow processing General Comments: WFL for tasks assessed.   General Comments       Exercises     Shoulder Instructions       Home Living Family/patient expects to be discharged to:: Private residence Living Arrangements: Spouse/significant other Available Help at Discharge: Family Type of Home: House Home Access: Level entry     Home Layout: Two level;Able to live on main level with bedroom/bathroom     Bathroom Shower/Tub: Teacher, early years/pre: Standard     Home Equipment: Other (comment);Walker - 2 wheels (Walking sticks, RW present in room given during this admission)          Prior Functioning/Environment Level of Independence: Independent with assistive device(s)        Comments: Wife reports occasional use of walking stick, but overall very independent.        OT Problem List:        OT Treatment/Interventions:      OT Goals(Current goals can be found in the care plan section) Acute Rehab OT Goals Patient Stated Goal: to be independent OT Goal Formulation: With patient  OT Frequency:     Barriers to D/C:            Co-evaluation              AM-PAC OT "6 Clicks" Daily Activity     Outcome Measure Help from another person eating meals?: None Help from another person taking care of personal grooming?: None Help from another person toileting, which includes using toliet, bedpan, or urinal?: None Help from another person bathing (including washing, rinsing, drying)?: None Help from another person to put on and taking off regular upper body clothing?: None Help from another person to put on and taking off regular lower body clothing?: None 6 Click Score: 24   End of Session Nurse Communication: Mobility status  Activity Tolerance: Patient tolerated treatment well Patient left: in chair;with call bell/phone within reach                   Time: 1205-1221 OT Time Calculation (min): 16 min Charges:  OT General Charges $OT Visit: 1 Visit OT Evaluation $OT Eval Low Complexity: 1 Low  Khristen Cheyney H. OTR/L Supplemental OT, Department of rehab services  [phone]  Emila Steinhauser R H. 02/15/2020, 12:51 PM

## 2020-02-15 NOTE — TOC Transition Note (Signed)
Transition of Care [facility]) - CM/SW Discharge Note   Patient Details  Name: Jose Fuentes MRN: [id number] Date of Birth: 1940-03-03  [facility]) CM/SW Contact:  Pollie Friar, RN Phone Number: 02/15/2020, 12:27 PM   Clinical Narrative:    Pt is discharging home with [facility] services through [facility]. Amy with [facility] accepted the referral.  Walker for home delivered to the room per [facility]. Pt has transportation home.   Final next level of care: Home w Home Health Services Barriers to Discharge: No Barriers Identified   Patient Goals and CMS Choice   CMS Medicare.gov Compare Post Acute Care list provided to:: Patient Choice offered to / list presented to : Patient  Discharge Placement                       Discharge Plan and Services                          HH Arranged: PT [facility] Agency: [facility] Date [place]: 02/15/20   Representative spoke with at [place]: Amy  Social Determinants of Health ([place]) Interventions     Readmission Risk Interventions No flowsheet data found.

## 2020-02-15 NOTE — Plan of Care (Signed)
  Problem: Education: Goal: Knowledge of disease or condition will improve Outcome: Progressing Goal: Knowledge of secondary prevention will improve Outcome: Progressing Goal: Knowledge of patient specific risk factors addressed and post discharge goals established will improve Outcome: Progressing Goal: Individualized Educational Video(s) Outcome: Progressing   Problem: Coping: Goal: Will identify appropriate support needs Outcome: Progressing   Problem: Health Behavior/Discharge Planning: Goal: Ability to manage health-related needs will improve Outcome: Progressing   Problem: Self-Care: Goal: Ability to participate in self-care as condition permits will improve Outcome: Progressing Goal: Ability to communicate needs accurately will improve Outcome: Progressing   Problem: Nutrition: Goal: Risk of aspiration will decrease Outcome: Progressing   Problem: Ischemic Stroke/TIA Tissue Perfusion: Goal: Complications of ischemic stroke/TIA will be minimized Outcome: Progressing

## 2020-02-15 NOTE — Progress Notes (Signed)
Neurology Progress Note Brief Summary of HPI: 80 year old male with significant past medical history including AF, HLD, HTN who presented to the ED 02/14/20 with acute onset gait instability, intermittent dysarthria, and mild confusion with distorted vision. Visual abnormality described by the patient as "my vision catching up with the eye movement" with perception of a lack of boundaries between objects following changing the direction of his gaze.   Subjective: No acute overnight events. Endorses subjective improvement in mental clarity and improved strength with standing/ambulation today. Patient sitting up, assisting HCT with bed bath this morning on examination. Greets examiner as she walks into the room. Per physical therapy notes, much improved stability and mobility tolerance today relative to yesterday with increased ambulation distance.  Objective: Current vital signs: BP 134/82 (BP Location: Right Arm)   Pulse 84   Temp 98.5 F (36.9 C) (Oral)   Resp 18   Ht 5\' 6"  (1.676 m)   Wt 59.9 kg   SpO2 97%   BMI 21.31 kg/m  Vital signs in last 24 hours: Temp:  [97.5 F (36.4 C)-98.5 F (36.9 C)] 98.5 F (36.9 C) (02/11 0751) Pulse Rate:  [70-84] 84 (02/11 0751) Resp:  [14-30] 18 (02/11 0751) BP: (129-150)/(79-99) 134/82 (02/11 0751) SpO2:  [96 %-100 %] 97 % (02/11 0751) Weight:  [59.9 kg] 59.9 kg (02/10 1625)  Intake/Output from previous day: 02/10 0701 - 02/11 0700 In: 240 [P.O.:240] Out: 1250 [Urine:1250] Intake/Output this shift: Total I/O In: 468 [P.O.:468] Out: 350 [Urine:350]  Nutritional status:  Diet Order            Diet Heart Room service appropriate? Yes; Fluid consistency: Thin  Diet effective now                Gen: Sitting at bedside next to side table, no acute distress.  Resp: non-labored breathing, no acute distress Abd: soft, non-tender, non-distended  Neurologic Exam: Mental Status: Awake and alert to self and situation. He claims he is at  [facility] [place] He initially states that today is "Friday, June 2011" and then states the year is 2022 and states "I don't know why I sometimes call the month July". He is able to correctly state his name. Speech is intact without aphasia. Naming and comprehension intact. He has good attention. States months of the year correctly forwards and with one mistake backward. Patient speech is not consistently dysarthric but with some intermittent dysarthric and hypophonic speech during more complex cognitive tasks.  CN:  II: Visual fieldsintact with no extinction to DSS. PERRL 3 mm/brisk. III,IV, BJ:SEGBTD not present. EOM are conjugate and full, but with saccadic quality. V: Facialsensation to light touch equal bilaterally VVO:HYWV symmetric resting and smiling.  VIII: Hearingintact to voice IX,X:Mild hypophonia that fluctuates mildly during the exam, appears during increasingly complex cognitive tasks. PX:TGGYIRSWNIOEVOJJK shrug KXF:GHWEXHB tongue extension Motor:  Mild to moderate cogwheeling of bilateral upper extremities with some paratonia. Paratonia noted in bilateral lower extremities. Alternating hand movements impaired in upper extremities with tremor increasing with intention. Fine motor and rapidly alternating movements of upper extremities impaired and with increasing tremor noted of upper extremities with assessment. Movements further impaired with contralateral distraction.  Sensory: Sensation to light touch present and symmetric in bilateral upper and lower extremities. 2 point discrimination intact.  Cerebellar: Action tremor with FNF bilaterally. Upper and lower extremities without ataxia.  DTR: 2+ and symmetric throughout Plantars: Right: downgoing Left: upgoing Gait: Stooped posture noted with standing. Does  initially fall to sitting with attempt at standing independently. Moderate instability noted but with brisk initiation of feet  movement for ambulation. Steps are normal in length forward, backward, and laterally. Holds tightly to examiner during gait assessment.    Lab Results: Results for orders placed or performed during the hospital encounter of 02/14/20 (from the past 48 hour(s))  Ethanol     Status: None   Collection Time: 02/14/20  6:16 AM  Result Value Ref Range   Alcohol, Ethyl (B) <10 <10 mg/dL    Comment: (NOTE) Lowest detectable limit for serum alcohol is 10 mg/dL.  For medical purposes only. Performed at [facility], [place] [address], [place], [place] [address]   Protime-INR     Status: None   Collection Time: 02/14/20  6:16 AM  Result Value Ref Range   Prothrombin Time 13.3 11.4 - 15.2 seconds   INR 1.1 0.8 - 1.2    Comment: (NOTE) INR goal varies based on device and disease states. Performed at [facility], [place] [address], [place], [place] [address]   APTT     Status: None   Collection Time: 02/14/20  6:16 AM  Result Value Ref Range   aPTT 27 24 - 36 seconds    Comment: Performed at [facility] [address] [place] [address]  CBC     Status: None   Collection Time: 02/14/20  6:16 AM  Result Value Ref Range   WBC 4.3 4.0 - 10.5 K/uL   RBC 4.67 4.22 - 5.81 MIL/uL   Hemoglobin 14.7 13.0 - 17.0 g/dL   HCT 43.8 39.0 - 52.0 %   MCV 93.8 80.0 - 100.0 fL   MCH 31.5 26.0 - 34.0 pg   MCHC 33.6 30.0 - 36.0 g/dL   RDW 14.5 11.5 - 15.5 %   Platelets 153 150 - 400 K/uL   nRBC 0.0 0.0 - 0.2 %    Comment: Performed at [facility], [place] [address] [place], [place] [address]  Differential     Status: None   Collection Time: 02/14/20  6:16 AM  Result Value Ref Range   Neutrophils Relative % 53 %   Neutro Abs 2.3 1.7 - 7.7 K/uL   Lymphocytes Relative 34 %   Lymphs Abs 1.5 0.7 - 4.0 K/uL   Monocytes Relative 11 %   Monocytes Absolute 0.5 0.1 - 1.0 K/uL   Eosinophils Relative 1 %   Eosinophils Absolute 0.1 0.0 - 0.5 K/uL   Basophils Relative 1 %    Basophils Absolute 0.0 0.0 - 0.1 K/uL   Immature Granulocytes 0 %   Abs Immature Granulocytes 0.01 0.00 - 0.07 K/uL    Comment: Performed at [facility] [address], [place],  [address]  Comprehensive metabolic panel     Status: None   Collection Time: 02/14/20  6:16 AM  Result Value Ref Range   Sodium 135 135 - 145 mmol/L   Potassium 3.6 3.5 - 5.1 mmol/L   Chloride 98 98 - 111 mmol/L   CO2 24 22 - 32 mmol/L   Glucose, Bld 95 70 - 99 mg/dL    Comment: Glucose reference range applies only to samples taken after fasting for at least 8 hours.   BUN 12 8 - 23 mg/dL   Creatinine, Ser 0.86 0.61 - 1.24 mg/dL   Calcium 8.9 8.9 - 10.3 mg/dL   Total Protein 6.5 6.5 - 8.1 g/dL  Albumin 3.7 3.5 - 5.0 g/dL   AST 25 15 - 41 U/L   ALT 19 0 - 44 U/L   Alkaline Phosphatase 80 38 - 126 U/L   Total Bilirubin 1.0 0.3 - 1.2 mg/dL   GFR, Estimated >60 >60 mL/min    Comment: (NOTE) Calculated using the CKD-EPI Creatinine Equation (2021)    Anion gap 13 5 - 15    Comment: Performed at [facility] [address] [place], [place] [address]  Resp Panel by RT-PCR (Flu A&B, Covid) Nasopharyngeal Swab     Status: None   Collection Time: 02/14/20  6:47 AM   Specimen: Nasopharyngeal Swab; Nasopharyngeal(NP) swabs in vial transport medium  Result Value Ref Range   SARS Coronavirus 2 by RT PCR NEGATIVE NEGATIVE    Comment: (NOTE) SARS-CoV-2 target nucleic acids are NOT DETECTED.  The SARS-CoV-2 RNA is generally detectable in upper respiratory specimens during the acute phase of infection. The lowest concentration of SARS-CoV-2 viral copies this assay can detect is 138 copies/mL. A negative result does not preclude SARS-Cov-2 infection and should not be used as the sole basis for treatment or other patient management decisions. A negative result may occur with  improper specimen collection/handling, submission of specimen other than nasopharyngeal swab, presence of viral  mutation(s) within the areas targeted by this assay, and inadequate number of viral copies(<138 copies/mL). A negative result must be combined with clinical observations, patient history, and epidemiological information. The expected result is Negative.  Fact Sheet for Patients:  EntrepreneurPulse.com.au  Fact Sheet for Healthcare Providers:  IncredibleEmployment.be  This test is no t yet approved or cleared by the [place] FDA and  has been authorized for detection and/or diagnosis of SARS-CoV-2 by FDA under an Emergency Use Authorization (EUA). This EUA will remain  in effect (meaning this test can be used) for the duration of the COVID-19 declaration under Section 564(b)(1) of the Act, 21 U.S.C.section 360bbb-3(b)(1), unless the authorization is terminated  or revoked sooner.       Influenza A by PCR NEGATIVE NEGATIVE   Influenza B by PCR NEGATIVE NEGATIVE    Comment: (NOTE) The Xpert Xpress SARS-CoV-2/FLU/RSV plus assay is intended as an aid in the diagnosis of influenza from Nasopharyngeal swab specimens and should not be used as a sole basis for treatment. Nasal washings and aspirates are unacceptable for Xpert Xpress SARS-CoV-2/FLU/RSV testing.  Fact Sheet for Patients: EntrepreneurPulse.com.au  Fact Sheet for Healthcare Providers: IncredibleEmployment.be  This test is not yet approved or cleared by the [place] FDA and has been authorized for detection and/or diagnosis of SARS-CoV-2 by FDA under an Emergency Use Authorization (EUA). This EUA will remain in effect (meaning this test can be used) for the duration of the COVID-19 declaration under Section 564(b)(1) of the Act, 21 U.S.C. section 360bbb-3(b)(1), unless the authorization is terminated or revoked.  Performed at [facility], [place] [address], [place], [place] [address]   Urine rapid drug screen (hosp performed)      Status: None   Collection Time: 02/14/20  7:54 AM  Result Value Ref Range   Opiates NONE DETECTED NONE DETECTED   Cocaine NONE DETECTED NONE DETECTED   Benzodiazepines NONE DETECTED NONE DETECTED   Amphetamines NONE DETECTED NONE DETECTED   Tetrahydrocannabinol NONE DETECTED NONE DETECTED   Barbiturates NONE DETECTED NONE DETECTED    Comment: (NOTE) DRUG SCREEN FOR MEDICAL PURPOSES ONLY.  IF CONFIRMATION IS NEEDED FOR ANY PURPOSE, NOTIFY LAB WITHIN 5 DAYS.  LOWEST DETECTABLE LIMITS FOR URINE DRUG SCREEN Drug Class                     Cutoff (ng/mL) Amphetamine and metabolites    1000 Barbiturate and metabolites    200 Benzodiazepine                 016 Tricyclics and metabolites     300 Opiates and metabolites        300 Cocaine and metabolites        300 THC                            50 Performed at [facility], [place] [address] [place], [place] [address]   Urinalysis, Routine w reflex microscopic     Status: None   Collection Time: 02/14/20  7:54 AM  Result Value Ref Range   Color, Urine YELLOW YELLOW   APPearance CLEAR CLEAR   Specific Gravity, Urine 1.015 1.005 - 1.030   pH 6.0 5.0 - 8.0   Glucose, UA NEGATIVE NEGATIVE mg/dL   Hgb urine dipstick NEGATIVE NEGATIVE   Bilirubin Urine NEGATIVE NEGATIVE   Ketones, ur NEGATIVE NEGATIVE mg/dL   Protein, ur NEGATIVE NEGATIVE mg/dL   Nitrite NEGATIVE NEGATIVE   Leukocytes,Ua NEGATIVE NEGATIVE    Comment: Microscopic not done on urines with negative protein, blood, leukocytes, nitrite, or glucose < 500 mg/dL. Performed at [facility], [place] [address], [place], [place] [address]   Lipid panel     Status: None   Collection Time: 02/14/20  9:17 AM  Result Value Ref Range   Cholesterol 197 0 - 200 mg/dL   Triglycerides 34 <150 mg/dL   HDL 101 >40 mg/dL   Total CHOL/HDL Ratio 2.0 RATIO   VLDL 7 0 - 40 mg/dL   LDL Cholesterol 89 0 - 99 mg/dL    Comment:        Total Cholesterol/HDL:CHD Risk Coronary Heart  Disease Risk Table                     Men   Women  1/2 Average Risk   3.4   3.3  Average Risk       5.0   4.4  2 X Average Risk   9.6   7.1  3 X Average Risk  23.4   11.0        Use the calculated Patient Ratio above and the CHD Risk Table to determine the patient's CHD Risk.        ATP III CLASSIFICATION (LDL):  <100     mg/dL   Optimal  100-129  mg/dL   Near or Above                    Optimal  130-159  mg/dL   Borderline  160-189  mg/dL   High  >190     mg/dL   Very High Performed at [facility] [address] [place] [address], [place], [place] [address]   Hemoglobin A1c     Status: Abnormal   Collection Time: 02/14/20  9:17 AM  Result Value Ref Range   Hgb A1c MFr Bld 5.8 (H) 4.8 - 5.6 %    Comment: (NOTE) Pre diabetes:          5.7%-6.4%  Diabetes:              >6.4%  Glycemic control for   <7.0% adults with diabetes    Mean Plasma Glucose 119.76 mg/dL    Comment: Performed at [facility] [address], [place], [place] [address]  TSH     Status: None   Collection Time: 02/14/20  9:20 AM  Result Value Ref Range   TSH 2.057 0.350 - 4.500 uIU/mL    Comment: Performed by a 3rd Generation assay with a functional sensitivity of <=0.01 uIU/mL. Performed at [facility], [place] [address] [place], [place] [address]   Vitamin B12     Status: None   Collection Time: 02/14/20  6:06 PM  Result Value Ref Range   Vitamin B-12 836 180 - 914 pg/mL    Comment: (NOTE) This assay is not validated for testing neonatal or myeloproliferative syndrome specimens for Vitamin B12 levels. Performed at [facility], [place] [address] [place], [place] [address]   CBC     Status: Abnormal   Collection Time: 02/15/20  2:40 AM  Result Value Ref Range   WBC 4.1 4.0 - 10.5 K/uL   RBC 4.38 4.22 - 5.81 MIL/uL   Hemoglobin 14.1 13.0 - 17.0 g/dL   HCT 41.0 39.0 - 52.0 %   MCV 93.6 80.0 - 100.0 fL   MCH 32.2 26.0 - 34.0 pg   MCHC 34.4 30.0 - 36.0 g/dL   RDW 14.6 11.5 - 15.5 %    Platelets 133 (L) 150 - 400 K/uL   nRBC 0.0 0.0 - 0.2 %    Comment: Performed at [facility], [place] [address] [place], [place] [address]  Basic metabolic panel     Status: None   Collection Time: 02/15/20  2:40 AM  Result Value Ref Range   Sodium 136 135 - 145 mmol/L   Potassium 3.5 3.5 - 5.1 mmol/L   Chloride 101 98 - 111 mmol/L   CO2 25 22 - 32 mmol/L   Glucose, Bld 95 70 - 99 mg/dL    Comment: Glucose reference range applies only to samples taken after fasting for at least 8 hours.   BUN 12 8 - 23 mg/dL   Creatinine, Ser 1.00 0.61 - 1.24 mg/dL   Calcium 8.9 8.9 - 10.3 mg/dL   GFR, Estimated >60 >60 mL/min    Comment: (NOTE) Calculated using the CKD-EPI Creatinine Equation (2021)    Anion gap 10 5 - 15    Comment: Performed at [facility] [address] [place], [place] [address]  Magnesium     Status: None   Collection Time: 02/15/20  2:40 AM  Result Value Ref Range   Magnesium 1.7 1.7 - 2.4 mg/dL    Comment: Performed at [facility] [address], [place], [place] [address]  Phosphorus     Status: None   Collection Time: 02/15/20  2:40 AM  Result Value Ref Range   Phosphorus 4.0 2.5 - 4.6 mg/dL    Comment: Performed at [facility] [address], [place], [place] [address]    Recent Results (from the past 240 hour(s))  Resp Panel by RT-PCR (Flu A&B, Covid) Nasopharyngeal Swab     Status: None   Collection Time: 02/14/20  6:47 AM   Specimen: Nasopharyngeal Swab; Nasopharyngeal(NP) swabs in vial transport medium  Result Value Ref Range Status   SARS Coronavirus 2 by RT PCR NEGATIVE NEGATIVE Final    Comment: (NOTE) SARS-CoV-2 target nucleic acids are NOT DETECTED.  The SARS-CoV-2 RNA is generally detectable in upper respiratory specimens during the acute phase of infection. The lowest concentration of SARS-CoV-2 viral copies this assay can detect is 138 copies/mL. A negative result does not preclude SARS-Cov-2 infection and should not  be used as the sole basis for treatment or other patient management decisions. A negative result may occur with  improper specimen collection/handling, submission of specimen other than nasopharyngeal swab, presence of viral mutation(s) within the areas targeted by this assay, and inadequate number of viral copies(<138 copies/mL). A negative result must be combined with clinical observations, patient history, and epidemiological information. The expected result is Negative.  Fact Sheet for Patients:  EntrepreneurPulse.com.au  Fact Sheet for Healthcare Providers:  IncredibleEmployment.be  This test is no t yet approved or cleared by the [place] FDA and  has been authorized for detection and/or diagnosis of SARS-CoV-2 by FDA under an Emergency Use Authorization (EUA). This EUA will remain  in effect (meaning this test can be used) for the duration of the COVID-19 declaration under Section 564(b)(1) of the Act, 21 U.S.C.section 360bbb-3(b)(1), unless the authorization is terminated  or revoked sooner.       Influenza A by PCR NEGATIVE NEGATIVE Final   Influenza B by PCR NEGATIVE NEGATIVE Final    Comment: (NOTE) The Xpert Xpress SARS-CoV-2/FLU/RSV plus assay is intended as an aid in the diagnosis of influenza from Nasopharyngeal swab specimens and should not be used as a sole basis for treatment. Nasal washings and aspirates are unacceptable for Xpert Xpress SARS-CoV-2/FLU/RSV testing.  Fact Sheet for Patients: EntrepreneurPulse.com.au  Fact Sheet for Healthcare Providers: IncredibleEmployment.be  This test is not yet approved or cleared by the [place] FDA and has been authorized for detection and/or diagnosis of SARS-CoV-2 by FDA under an Emergency Use Authorization (EUA). This EUA will remain in effect (meaning this test can be used) for the duration of the COVID-19 declaration under Section  564(b)(1) of the Act, 21 U.S.C. section 360bbb-3(b)(1), unless the authorization is terminated or revoked.  Performed at [facility], [place] [address] [place], [place] [address]    Lipid Panel Recent Labs    02/14/20 0917  CHOL 197  TRIG 34  HDL 101  CHOLHDL 2.0  VLDL 7  LDLCALC 89   Studies/Results: CT Angio Head W or Wo Contrast  Result Date: 02/14/2020 CLINICAL DATA:  TIA. New onset gait instability, intermittent dysarthria, and mild confusion. EXAM: CT ANGIOGRAPHY HEAD AND NECK TECHNIQUE: Multidetector CT imaging of the head and neck was performed using the standard protocol during bolus administration of intravenous contrast. Multiplanar CT image reconstructions and MIPs were obtained to evaluate the vascular anatomy. Carotid stenosis measurements (when applicable) are obtained utilizing NASCET criteria, using the distal internal carotid diameter as the denominator. CONTRAST:  17mL OMNIPAQUE IOHEXOL 300 MG/ML  SOLN COMPARISON:  None. FINDINGS: CTA NECK FINDINGS Aortic arch: Standard 3 vessel aortic arch with mild atherosclerotic plaque. Widely patent arch vessel origins. Right carotid system: Patent without evidence of stenosis, dissection, or significant atherosclerosis. Left carotid system: Patent with minimal calcified plaque at the carotid bifurcation. No evidence of stenosis or dissection. Vertebral arteries: Patent without evidence of stenosis, dissection, or significant atherosclerosis. Codominant. Skeleton: Advanced disc degeneration from C3-4 to C6-7. Advanced diffuse cervical facet arthrosis. Other neck: No evidence of cervical lymphadenopathy. 6 mm well-defined hypodensity in the anteroinferior aspect of the left submandibular gland, too small to fully characterize though with its CT appearance as well as T1 hypointensity on  MRI potentially indicative of a small cyst. Upper chest: Mild scarring in the lung apices. Review of the MIP images confirms the above findings CTA  HEAD FINDINGS Anterior circulation: The internal carotid arteries are widely patent from skull base to carotid termini. ACAs and MCAs are patent without evidence of a proximal branch occlusion or significant proximal stenosis. No aneurysm is identified. Posterior circulation: The intracranial vertebral arteries are widely patent to the basilar. Patent bilateral PICA, right AICA, and bilateral SCA origins are identified. The left AICA is small with its origin poorly evaluated. The basilar artery is patent and mildly small in caliber diffusely on a congenital basis without evidence of a significant focal stenosis. There is a fetal origin of the left PCA. Both PCAs are patent without evidence of a significant proximal stenosis. No aneurysm is identified. Venous sinuses: Patent. Anatomic variants: Fetal left PCA. Review of the MIP images confirms the above findings IMPRESSION: 1. Minimal atherosclerosis without large vessel occlusion or significant stenosis in the head or neck. 2. Aortic Atherosclerosis (ICD10-I70.0). Electronically Signed   By: Logan Bores M.D.   On: 02/14/2020 12:10   DG Chest 2 View  Result Date: 02/14/2020 CLINICAL DATA:  Speech difficulty. EXAM: CHEST - 2 VIEW COMPARISON:  None. FINDINGS: The heart size and mediastinal contours are within normal limits. Both lungs are clear. The visualized skeletal structures are unremarkable. IMPRESSION: No active cardiopulmonary disease. Electronically Signed   By: Marijo Conception M.D.   On: 02/14/2020 09:56   CT HEAD WO CONTRAST  Result Date: 02/14/2020 CLINICAL DATA:  80 year old male with weakness and abnormal speech upon waking. EXAM: CT HEAD WITHOUT CONTRAST TECHNIQUE: Contiguous axial images were obtained from the base of the skull through the vertex without intravenous contrast. COMPARISON:  Brain MRI 02/15/2015.  Head CT 06/21/2018. FINDINGS: Brain: Cerebral volume is stable and within normal limits for age. No midline shift, ventriculomegaly,  mass effect, evidence of mass lesion, intracranial hemorrhage or evidence of cortically based acute infarction. Patchy fairly mild for age white matter hypodensity in both hemispheres appears stable since 2020. Vascular: Mild Calcified atherosclerosis at the skull base. No suspicious intracranial vascular hyperdensity. Skull: No acute osseous abnormality identified. Sinuses/Orbits: Visualized paranasal sinuses and mastoids are clear. Other: Stable orbit and scalp soft tissues. IMPRESSION: Stable since 2020 with mild for age cerebral white matter changes. Electronically Signed   By: Genevie Ann M.D.   On: 02/14/2020 07:50   CT Angio Neck W and/or Wo Contrast  Result Date: 02/14/2020 CLINICAL DATA:  TIA. New onset gait instability, intermittent dysarthria, and mild confusion. EXAM: CT ANGIOGRAPHY HEAD AND NECK TECHNIQUE: Multidetector CT imaging of the head and neck was performed using the standard protocol during bolus administration of intravenous contrast. Multiplanar CT image reconstructions and MIPs were obtained to evaluate the vascular anatomy. Carotid stenosis measurements (when applicable) are obtained utilizing NASCET criteria, using the distal internal carotid diameter as the denominator. CONTRAST:  69mL OMNIPAQUE IOHEXOL 300 MG/ML  SOLN COMPARISON:  None. FINDINGS: CTA NECK FINDINGS Aortic arch: Standard 3 vessel aortic arch with mild atherosclerotic plaque. Widely patent arch vessel origins. Right carotid system: Patent without evidence of stenosis, dissection, or significant atherosclerosis. Left carotid system: Patent with minimal calcified plaque at the carotid bifurcation. No evidence of stenosis or dissection. Vertebral arteries: Patent without evidence of stenosis, dissection, or significant atherosclerosis. Codominant. Skeleton: Advanced disc degeneration from C3-4 to C6-7. Advanced diffuse cervical facet arthrosis. Other neck: No evidence of cervical lymphadenopathy. 6 mm well-defined  hypodensity  in the anteroinferior aspect of the left submandibular gland, too small to fully characterize though with its CT appearance as well as T1 hypointensity on MRI potentially indicative of a small cyst. Upper chest: Mild scarring in the lung apices. Review of the MIP images confirms the above findings CTA HEAD FINDINGS Anterior circulation: The internal carotid arteries are widely patent from skull base to carotid termini. ACAs and MCAs are patent without evidence of a proximal branch occlusion or significant proximal stenosis. No aneurysm is identified. Posterior circulation: The intracranial vertebral arteries are widely patent to the basilar. Patent bilateral PICA, right AICA, and bilateral SCA origins are identified. The left AICA is small with its origin poorly evaluated. The basilar artery is patent and mildly small in caliber diffusely on a congenital basis without evidence of a significant focal stenosis. There is a fetal origin of the left PCA. Both PCAs are patent without evidence of a significant proximal stenosis. No aneurysm is identified. Venous sinuses: Patent. Anatomic variants: Fetal left PCA. Review of the MIP images confirms the above findings IMPRESSION: 1. Minimal atherosclerosis without large vessel occlusion or significant stenosis in the head or neck. 2. Aortic Atherosclerosis (ICD10-I70.0). Electronically Signed   By: Logan Bores M.D.   On: 02/14/2020 12:10   MR BRAIN WO CONTRAST  Result Date: 02/14/2020 CLINICAL DATA:  TIA.  Slurred speech and difficulty walking. EXAM: MRI HEAD WITHOUT CONTRAST TECHNIQUE: Multiplanar, multiecho pulse sequences of the brain and surrounding structures were obtained without intravenous contrast. COMPARISON:  MRI December 15, 2015.  CT head from the same day. FINDINGS: Brain: No acute infarction, hemorrhage, hydrocephalus, extra-axial collection or mass lesion. Similar tiny remote right pontine infarct versus dilated perivascular space. Similar scattered  T2/FLAIR hyperintensities within the white matter, most likely related to chronic microvascular ischemic disease. Similar generalized cerebral volume loss with ex vacuo ventricular dilation. Vascular: Major arterial flow voids are maintained at the skull base. Skull and upper cervical spine: Normal marrow signal. Progression of degenerative pannus along the dorsal dens. Sinuses/Orbits: Mild paranasal sinus mucosal thickening without air-fluid levels. Unremarkable orbits. Other: No mastoid effusions. IMPRESSION: 1. No acute intracranial abnormality.  No acute infarct. 2. Similar chronic findings, including tiny remote right pontine infarct versus dilated perivascular space and chronic microvascular ischemic disease. Electronically Signed   By: Margaretha Sheffield MD   On: 02/14/2020 10:33   Echocardiogram: - LVEF estimated 55-60% - Left ventricle diastolic parameters consistent with Grade I diastolic dysfunction - Right ventricular systolic function and size are normal - Mitral valve grossly normal  Medications:  Scheduled: . aspirin  325 mg Oral Daily  . atorvastatin  40 mg Oral Q supper  . enoxaparin (LOVENOX) injection  40 mg Subcutaneous Q24H  . folic acid  1 mg Oral Daily  . multivitamin with minerals  1 tablet Oral Daily  . pantoprazole  40 mg Oral QAC supper  . thiamine  100 mg Oral Daily   Or  . thiamine  100 mg Intravenous Daily   Assessment: 80 year old male with new onset of gait instability, intermittent dysarthria and mild confusion.  1. CT head with mild atrophy and chronic white matter changes. MRI brain without acute abnormality or infarct. MRI with tiny remove tight pontine infarct versus dilated perivascular space and chronic microvascular ischemic disease. CT angio with minimal atherosclerosis.   2. Exam is suggestive of a Parkinsonian syndrome. Of note, Parkinsonism can be associated with spontaneous fluctuations in motor and cognitive functioning, as well  as fluctuations due  to intercurrent illness. Of note, Parkinsonism can occur as a rare side effect of antidepressant medication in the elderly.   3. Probable sleep apnea, patient deferred sleep study in the past 4. Symptom of teeth chattering after increased dose of antidepressant. This is a known side effect.  5. MRI brain: No acute intracranial abnormality.  No acute infarct. Similar chronic findings, including tiny remote right pontine infarct versus dilated perivascular space and chronic microvascular ischemic disease. 6. CTA of head and neck: Minimal atherosclerosis without large vessel occlusion or significant stenosis in the head or neck. 7. Left ventricular ejection fraction, by estimation, is 55 to 60%. The left ventricle has normal function. The left ventricle has no regional wall motion abnormalities.   Recommendations: 1. He is at high risk for falls and risks of anticoagulation for his atrial fibrillation may outweigh benefits. Continue ASA.  2. Will need outpatient Neurology follow up for sleep study to assess probable OSA.  3. Will need outpatient Movement Disorders follow up. Recommend a tertiary care medical facility such as [facility] for Movement Disorders assessment.  4. PT for detailed assessment of his ambulation 5. Continue to hold his Wellbutrin and Zoloft      LOS: 0 days   @Electronically  signed: Dr. Kerney Elbe 02/15/2020  8:19 AM

## 2020-02-15 NOTE — Care Management Obs Status (Signed)
[place] NOTIFICATION   Patient Details  Name: Jose Fuentes MRN: [id number] Date of Birth: 1940/07/20   Medicare Observation Status Notification Given:  Yes    Pollie Friar, RN 02/15/2020, 10:44 AM

## 2020-02-15 NOTE — Progress Notes (Signed)
  Echocardiogram 2D Echocardiogram has been performed.  Jennette Dubin 02/15/2020, 9:52 AM

## 2020-02-15 NOTE — TOC CAGE-AID Note (Signed)
Transition of Care [facility]) - CAGE-AID Screening   Patient Details  Name: Jose Fuentes MRN: [id number] Date of Birth: 1940/06/25  Transition of Care [facility]) CM/SW Contact:    Pollie Friar, RN Phone Number: 02/15/2020, 10:45 AM   Clinical Narrative: Pt states he and his spouse have currently cut down on their alcohol consumption. He did not feel he needed resources for counseling at this time.   CAGE-AID Screening:    Have You Ever Felt You Ought to Cut Down on Your Drinking or Drug Use?: Yes Have People Annoyed You By Critizing Your Drinking Or Drug Use?: No Have You Felt Bad Or Guilty About Your Drinking Or Drug Use?: No Have You Ever Had a Drink or Used Drugs First Thing In The Morning to Steady Your Nerves or to Get Rid of a Hangover?: No CAGE-AID Score: 1  Substance Abuse Education Offered: Yes (Pt refused)

## 2020-02-20 ENCOUNTER — Other Ambulatory Visit: Payer: Self-pay | Admitting: Family Medicine

## 2020-02-20 DIAGNOSIS — F324 Major depressive disorder, single episode, in partial remission: Secondary | ICD-10-CM | POA: Diagnosis not present

## 2020-02-20 DIAGNOSIS — R2681 Unsteadiness on feet: Secondary | ICD-10-CM | POA: Diagnosis not present

## 2020-02-20 DIAGNOSIS — D6869 Other thrombophilia: Secondary | ICD-10-CM | POA: Diagnosis not present

## 2020-02-20 DIAGNOSIS — R0681 Apnea, not elsewhere classified: Secondary | ICD-10-CM | POA: Diagnosis not present

## 2020-02-20 DIAGNOSIS — I480 Paroxysmal atrial fibrillation: Secondary | ICD-10-CM | POA: Diagnosis not present

## 2020-02-20 DIAGNOSIS — G459 Transient cerebral ischemic attack, unspecified: Secondary | ICD-10-CM | POA: Diagnosis not present

## 2020-02-20 DIAGNOSIS — M5030 Other cervical disc degeneration, unspecified cervical region: Secondary | ICD-10-CM

## 2020-02-20 DIAGNOSIS — R7303 Prediabetes: Secondary | ICD-10-CM | POA: Diagnosis not present

## 2020-02-26 ENCOUNTER — Other Ambulatory Visit (HOSPITAL_BASED_OUTPATIENT_CLINIC_OR_DEPARTMENT_OTHER): Payer: Self-pay

## 2020-02-26 DIAGNOSIS — R0681 Apnea, not elsewhere classified: Secondary | ICD-10-CM

## 2020-02-28 ENCOUNTER — Inpatient Hospital Stay (HOSPITAL_BASED_OUTPATIENT_CLINIC_OR_DEPARTMENT_OTHER)
Admission: EM | Admit: 2020-02-28 | Discharge: 2020-03-01 | DRG: 395 | Disposition: A | Discharge status: Home / Self Care | Payer: Medicare Other | Attending: Internal Medicine | Admitting: Internal Medicine

## 2020-02-28 ENCOUNTER — Emergency Department (HOSPITAL_BASED_OUTPATIENT_CLINIC_OR_DEPARTMENT_OTHER): Payer: Medicare Other

## 2020-02-28 ENCOUNTER — Encounter (HOSPITAL_BASED_OUTPATIENT_CLINIC_OR_DEPARTMENT_OTHER): Payer: Self-pay | Admitting: Emergency Medicine

## 2020-02-28 ENCOUNTER — Ambulatory Visit (HOSPITAL_BASED_OUTPATIENT_CLINIC_OR_DEPARTMENT_OTHER): Payer: Medicare Other | Attending: Internal Medicine | Admitting: Internal Medicine

## 2020-02-28 ENCOUNTER — Other Ambulatory Visit: Payer: Self-pay

## 2020-02-28 DIAGNOSIS — Z808 Family history of malignant neoplasm of other organs or systems: Secondary | ICD-10-CM | POA: Diagnosis not present

## 2020-02-28 DIAGNOSIS — K219 Gastro-esophageal reflux disease without esophagitis: Secondary | ICD-10-CM | POA: Diagnosis present

## 2020-02-28 DIAGNOSIS — R7303 Prediabetes: Secondary | ICD-10-CM | POA: Diagnosis present

## 2020-02-28 DIAGNOSIS — X58XXXA Exposure to other specified factors, initial encounter: Secondary | ICD-10-CM | POA: Diagnosis present

## 2020-02-28 DIAGNOSIS — Z888 Allergy status to other drugs, medicaments and biological substances status: Secondary | ICD-10-CM

## 2020-02-28 DIAGNOSIS — Z841 Family history of disorders of kidney and ureter: Secondary | ICD-10-CM | POA: Diagnosis not present

## 2020-02-28 DIAGNOSIS — Z20822 Contact with and (suspected) exposure to covid-19: Secondary | ICD-10-CM | POA: Diagnosis present

## 2020-02-28 DIAGNOSIS — K6289 Other specified diseases of anus and rectum: Secondary | ICD-10-CM

## 2020-02-28 DIAGNOSIS — I480 Paroxysmal atrial fibrillation: Secondary | ICD-10-CM | POA: Diagnosis not present

## 2020-02-28 DIAGNOSIS — Z8249 Family history of ischemic heart disease and other diseases of the circulatory system: Secondary | ICD-10-CM

## 2020-02-28 DIAGNOSIS — G20 Parkinson's disease: Secondary | ICD-10-CM | POA: Diagnosis present

## 2020-02-28 DIAGNOSIS — R109 Unspecified abdominal pain: Secondary | ICD-10-CM | POA: Diagnosis not present

## 2020-02-28 DIAGNOSIS — Z87891 Personal history of nicotine dependence: Secondary | ICD-10-CM

## 2020-02-28 DIAGNOSIS — F32A Depression, unspecified: Secondary | ICD-10-CM | POA: Diagnosis present

## 2020-02-28 DIAGNOSIS — I10 Essential (primary) hypertension: Secondary | ICD-10-CM | POA: Diagnosis present

## 2020-02-28 DIAGNOSIS — K631 Perforation of intestine (nontraumatic): Secondary | ICD-10-CM | POA: Diagnosis not present

## 2020-02-28 DIAGNOSIS — K5909 Other constipation: Secondary | ICD-10-CM | POA: Diagnosis present

## 2020-02-28 DIAGNOSIS — Z79899 Other long term (current) drug therapy: Secondary | ICD-10-CM | POA: Diagnosis not present

## 2020-02-28 DIAGNOSIS — Z85820 Personal history of malignant melanoma of skin: Secondary | ICD-10-CM

## 2020-02-28 DIAGNOSIS — E785 Hyperlipidemia, unspecified: Secondary | ICD-10-CM | POA: Diagnosis present

## 2020-02-28 DIAGNOSIS — Y92009 Unspecified place in unspecified non-institutional (private) residence as the place of occurrence of the external cause: Secondary | ICD-10-CM | POA: Diagnosis not present

## 2020-02-28 DIAGNOSIS — Z7982 Long term (current) use of aspirin: Secondary | ICD-10-CM | POA: Diagnosis not present

## 2020-02-28 DIAGNOSIS — S3662XA Contusion of rectum, initial encounter: Principal | ICD-10-CM | POA: Diagnosis present

## 2020-02-28 DIAGNOSIS — K610 Anal abscess: Secondary | ICD-10-CM | POA: Diagnosis not present

## 2020-02-28 DIAGNOSIS — S3660XA Unspecified injury of rectum, initial encounter: Secondary | ICD-10-CM

## 2020-02-28 LAB — BASIC METABOLIC PANEL
Anion gap: 10 (ref 5–15)
BUN: 15 mg/dL (ref 8–23)
CO2: 25 mmol/L (ref 22–32)
Calcium: 9.2 mg/dL (ref 8.9–10.3)
Chloride: 100 mmol/L (ref 98–111)
Creatinine, Ser: 0.84 mg/dL (ref 0.61–1.24)
GFR, Estimated: >60 mL/min (ref >60)
Glucose, Bld: 136 mg/dL — ABNORMAL HIGH (ref 70–99)
Potassium: 3.7 mmol/L (ref 3.5–5.1)
Sodium: 135 mmol/L (ref 135–145)

## 2020-02-28 LAB — CBC WITH DIFFERENTIAL/PLATELET
Abs Immature Granulocytes: 0.04 10*3/uL (ref 0.00–0.07)
Basophils Absolute: 0 10*3/uL (ref 0.0–0.1)
Basophils Relative: 0 %
Eosinophils Absolute: 0 10*3/uL (ref 0.0–0.5)
Eosinophils Relative: 0 %
HCT: 43.8 % (ref 39.0–52.0)
Hemoglobin: 15.2 g/dL (ref 13.0–17.0)
Immature Granulocytes: 0 %
Lymphocytes Relative: 6 %
Lymphs Abs: 0.6 10*3/uL — ABNORMAL LOW (ref 0.7–4.0)
MCH: 32.4 pg (ref 26.0–34.0)
MCHC: 34.7 g/dL (ref 30.0–36.0)
MCV: 93.4 fL (ref 80.0–100.0)
Monocytes Absolute: 0.5 10*3/uL (ref 0.1–1.0)
Monocytes Relative: 5 %
Neutro Abs: 8 10*3/uL — ABNORMAL HIGH (ref 1.7–7.7)
Neutrophils Relative %: 89 %
Platelets: 142 10*3/uL — ABNORMAL LOW (ref 150–400)
RBC: 4.69 MIL/uL (ref 4.22–5.81)
RDW: 14 % (ref 11.5–15.5)
WBC: 9.2 10*3/uL (ref 4.0–10.5)
nRBC: 0 % (ref 0.0–0.2)

## 2020-02-28 LAB — LIPASE, BLOOD: Lipase: 37 U/L (ref 11–51)

## 2020-02-28 LAB — RESP PANEL BY RT-PCR (FLU A&B, COVID) ARPGX2
Influenza A by PCR: NEGATIVE
Influenza B by PCR: NEGATIVE
SARS Coronavirus 2 by RT PCR: NEGATIVE

## 2020-02-28 MED ORDER — FENTANYL CITRATE (PF) 100 MCG/2ML IJ SOLN
INTRAMUSCULAR | Status: AC
  Administered 2020-02-28: 75 ug
  Filled 2020-02-28: qty 2

## 2020-02-28 MED ORDER — LIDOCAINE HCL URETHRAL/MUCOSAL 2 % EX GEL
1.0000 "application " | Freq: Once | CUTANEOUS | Status: AC
  Administered 2020-02-28: 1
  Filled 2020-02-28: qty 11

## 2020-02-28 MED ORDER — PIPERACILLIN-TAZOBACTAM 3.375 G IVPB 30 MIN
3.3750 g | Freq: Once | INTRAVENOUS | Status: AC
  Administered 2020-02-28: 3.375 g via INTRAVENOUS
  Filled 2020-02-28: qty 50

## 2020-02-28 MED ORDER — ONDANSETRON HCL 4 MG PO TABS: 4.0000 mg | ORAL_TABLET | Freq: Four times a day (QID) | ORAL | Status: DC | PRN

## 2020-02-28 MED ORDER — SODIUM CHLORIDE 0.9 % IV SOLN: INTRAVENOUS | Status: DC | PRN

## 2020-02-28 MED ORDER — DESVENLAFAXINE SUCCINATE ER 25 MG PO TB24: 75.0000 mg | ORAL_TABLET | Freq: Every day | ORAL | Status: DC

## 2020-02-28 MED ORDER — ACETAMINOPHEN 325 MG PO TABS: 650.0000 mg | ORAL_TABLET | Freq: Four times a day (QID) | ORAL | Status: DC | PRN

## 2020-02-28 MED ORDER — MINERAL OIL RE ENEM
1.0000 | ENEMA | Freq: Once | RECTAL | Status: AC
  Administered 2020-02-28: 1 via RECTAL
  Filled 2020-02-28: qty 1

## 2020-02-28 MED ORDER — VITAMIN B-12 1000 MCG PO TABS
1000.0000 ug | ORAL_TABLET | Freq: Every day | ORAL | Status: DC
  Filled 2020-02-28: qty 1

## 2020-02-28 MED ORDER — FENTANYL CITRATE (PF) 100 MCG/2ML IJ SOLN
12.5000 ug | INTRAMUSCULAR | Status: DC | PRN
  Administered 2020-02-28 – 2020-02-29 (×2): 12.5 ug via INTRAVENOUS
  Filled 2020-02-28 (×2): qty 2

## 2020-02-28 MED ORDER — ATORVASTATIN CALCIUM 40 MG PO TABS: 40.0000 mg | ORAL_TABLET | Freq: Every day | ORAL | Status: DC

## 2020-02-28 MED ORDER — PANTOPRAZOLE SODIUM 40 MG IV SOLR
40.0000 mg | INTRAVENOUS | Status: DC
  Administered 2020-02-28: 40 mg via INTRAVENOUS
  Filled 2020-02-28: qty 40

## 2020-02-28 MED ORDER — PANTOPRAZOLE SODIUM 40 MG PO TBEC: 40.0000 mg | DELAYED_RELEASE_TABLET | Freq: Every day | ORAL | Status: DC

## 2020-02-28 MED ORDER — SODIUM CHLORIDE 0.9 % IV SOLN: INTRAVENOUS | Status: AC

## 2020-02-28 MED ORDER — ONDANSETRON HCL 4 MG/2ML IJ SOLN: 4.0000 mg | Freq: Four times a day (QID) | INTRAMUSCULAR | Status: DC | PRN

## 2020-02-28 MED ORDER — FOLIC ACID 1 MG PO TABS: 1.0000 mg | ORAL_TABLET | Freq: Every day | ORAL | Status: DC

## 2020-02-28 MED ORDER — THIAMINE HCL 100 MG PO TABS: 100.0000 mg | ORAL_TABLET | Freq: Every day | ORAL | Status: DC

## 2020-02-28 MED ORDER — FENTANYL CITRATE (PF) 100 MCG/2ML IJ SOLN: 75.0000 ug | Freq: Once | INTRAMUSCULAR | Status: AC

## 2020-02-28 MED ORDER — PIPERACILLIN-TAZOBACTAM 3.375 G IVPB
3.3750 g | Freq: Three times a day (TID) | INTRAVENOUS | Status: DC
  Administered 2020-02-29 – 2020-03-01 (×5): 3.375 g via INTRAVENOUS
  Filled 2020-02-28 (×5): qty 50

## 2020-02-28 MED ORDER — IOHEXOL 300 MG/ML  SOLN
100.0000 mL | Freq: Once | INTRAMUSCULAR | Status: AC | PRN
  Administered 2020-02-28: 100 mL via INTRAVENOUS

## 2020-02-28 MED ORDER — VITAMIN D 25 MCG (1000 UNIT) PO TABS: 1000.0000 [IU] | ORAL_TABLET | Freq: Every day | ORAL | Status: DC

## 2020-02-28 MED ORDER — BUPROPION HCL ER (XL) 300 MG PO TB24
300.0000 mg | ORAL_TABLET | Freq: Every day | ORAL | Status: DC
  Filled 2020-02-28: qty 1

## 2020-02-28 MED ORDER — MAGNESIUM CITRATE PO SOLN
1.0000 | Freq: Once | ORAL | Status: AC
  Administered 2020-02-28: 1 via ORAL
  Filled 2020-02-28: qty 296

## 2020-02-28 NOTE — ED Notes (Signed)
ED Provider at bedside. 

## 2020-02-28 NOTE — ED Notes (Signed)
Assisted to bedside commode, continues to report rectal pain

## 2020-02-28 NOTE — Progress Notes (Signed)
Pharmacy Antibiotic Note  Jose Fuentes is a 80 y.o. male admitted on 02/28/2020 with history of Parkinson's disease presents to ED with constipation.  This is been ongoing issue for several months.  Pharmacy has been consulted to dose zosyn for intra-abdominal infection  Plan: Zosyn 3.375g IV Q8H infused over 4hrs. Pharmacy will sign off and follow peripherally  Height: 5\' 6"  (167.6 cm) Weight: 59.9 kg (132 lb) IBW/kg (Calculated) : 63.8  Temp (24hrs), Avg:97.9 F (36.6 C), Min:97 F (36.1 C), Max:98.7 F (37.1 C)  Recent Labs  Lab 02/28/20 1543  WBC 9.2  CREATININE 0.84    Estimated Creatinine Clearance: 59.4 mL/min (by C-G formula based on SCr of 0.84 mg/dL).    Allergies  Allergen Reactions  . Azithromycin Diarrhea  . Escitalopram Oxalate Other (See Comments)    Worsening mood swings  . Fluoxetine Hcl Other (See Comments)    Worsening mood swings      Thank you for allowing pharmacy to be a part of this patient's care.  Dolly Rias RPh 02/28/2020, 10:01 PM

## 2020-02-28 NOTE — Consult Note (Signed)
Reason for Consult: Proctitis versus rectal perforation Referring Physician: Opyd MD.   Jose Fuentes is an 80 y.o. male.  HPI: Patient presents to [facility] emergency room for constipation and rectal pain that started 8:00 this morning.  Patient has history of constipation.  He was unable to move his bowels.  When the EDP performed the rectal exam, he had more pain than was expected with rectal examination.  A CT scan was obtained which shows small locules of air around the distal rectum concerning for possible proctitis or perforation.  No history of rectal trauma according to the patient.  Last colonoscopy appeared to be in 2017.  He states there are no issues.  He complains of some rectal pain currently but is otherwise very comfortable with stable vital signs.  He does have constipation but he sounds like he goes to the bathroom every other day.  He denies any blood in the stool or excessive straining.  CT scan was reviewed which showed locules of air in the distal rectum.  No abscess.  He denies any rectal drainage currently.  Past Medical History:  Diagnosis Date  . Atrial fibrillation ([place])   . Depression   . History of melanoma   . Hyperlipidemia   . Hypertension   . Peyronie disease     Past Surgical History:  Procedure Laterality Date  . KNEE SURGERY Left    meniscal tear  . MELANOMA EXCISION    . Thumb replacement Left   . TONSILLECTOMY      Family History  Problem Relation Age of Onset  . Heart attack Father        died in his sleep age 31  . Kidney failure Mother   . Melanoma Brother   . Melanoma Brother   . Glaucoma Sister   . Cancer Sister        glioblastoma    Social History:  reports that he quit smoking about 51 years ago. He has never used smokeless tobacco. He reports current alcohol use. He reports that he does not use drugs.  Allergies:  Allergies  Allergen Reactions  . Azithromycin Diarrhea  . Escitalopram Oxalate Other (See  Comments)    Worsening mood swings  . Fluoxetine Hcl Other (See Comments)    Worsening mood swings    Medications: I have reviewed the patient's current medications.  Results for orders placed or performed during the hospital encounter of 02/28/20 (from the past 48 hour(s))  Basic metabolic panel     Status: Abnormal   Collection Time: 02/28/20  3:43 PM  Result Value Ref Range   Sodium 135 135 - 145 mmol/L   Potassium 3.7 3.5 - 5.1 mmol/L   Chloride 100 98 - 111 mmol/L   CO2 25 22 - 32 mmol/L   Glucose, Bld 136 (H) 70 - 99 mg/dL    Comment: Glucose reference range applies only to samples taken after fasting for at least 8 hours.   BUN 15 8 - 23 mg/dL   Creatinine, Ser 0.84 0.61 - 1.24 mg/dL   Calcium 9.2 8.9 - 10.3 mg/dL   GFR, Estimated >60 >60 mL/min    Comment: (NOTE) Calculated using the CKD-EPI Creatinine Equation (2021)    Anion gap 10 5 - 15    Comment: Performed at [facility], [address] [place], [place] [address]  CBC with Differential     Status: Abnormal   Collection Time: 02/28/20  3:43 PM  Result Value Ref Range   WBC 9.2 4.0 - 10.5 K/uL   RBC 4.69 4.22 - 5.81 MIL/uL   Hemoglobin 15.2 13.0 - 17.0 g/dL   HCT 43.8 39.0 - 52.0 %   MCV 93.4 80.0 - 100.0 fL   MCH 32.4 26.0 - 34.0 pg   MCHC 34.7 30.0 - 36.0 g/dL   RDW 14.0 11.5 - 15.5 %   Platelets 142 (L) 150 - 400 K/uL    Comment: REPEATED TO VERIFY   nRBC 0.0 0.0 - 0.2 %   Neutrophils Relative % 89 %   Neutro Abs 8.0 (H) 1.7 - 7.7 K/uL   Lymphocytes Relative 6 %   Lymphs Abs 0.6 (L) 0.7 - 4.0 K/uL   Monocytes Relative 5 %   Monocytes Absolute 0.5 0.1 - 1.0 K/uL   Eosinophils Relative 0 %   Eosinophils Absolute 0.0 0.0 - 0.5 K/uL   Basophils Relative 0 %   Basophils Absolute 0.0 0.0 - 0.1 K/uL   Immature Granulocytes 0 %   Abs Immature Granulocytes 0.04 0.00 - 0.07 K/uL    Comment: Performed at [facility], [address] [place], [place] [address]  Lipase, blood      Status: None   Collection Time: 02/28/20  3:43 PM  Result Value Ref Range   Lipase 37 11 - 51 U/L    Comment: Performed at [facility], [address] [place], [place] [address]  Resp Panel by RT-PCR (Flu A&B, Covid) Nasopharyngeal Swab     Status: None   Collection Time: 02/28/20  6:04 PM   Specimen: Nasopharyngeal Swab; Nasopharyngeal(NP) swabs in vial transport medium  Result Value Ref Range   SARS Coronavirus 2 by RT PCR NEGATIVE NEGATIVE    Comment: (NOTE) SARS-CoV-2 target nucleic acids are NOT DETECTED.  The SARS-CoV-2 RNA is generally detectable in upper respiratory specimens during the acute phase of infection. The lowest concentration of SARS-CoV-2 viral copies this assay can detect is 138 copies/mL. A negative result does not preclude SARS-Cov-2 infection and should not be used as the sole basis for treatment or other patient management decisions. A negative result may occur with  improper specimen collection/handling, submission of specimen other than nasopharyngeal swab, presence of viral mutation(s) within the areas targeted by this assay, and inadequate number of viral copies(<138 copies/mL). A negative result must be combined with clinical observations, patient history, and epidemiological information. The expected result is Negative.  Fact Sheet for Patients:  EntrepreneurPulse.com.au  Fact Sheet for Healthcare Providers:  IncredibleEmployment.be  This test is no t yet approved or cleared by the [place] FDA and  has been authorized for detection and/or diagnosis of SARS-CoV-2 by FDA under an Emergency Use Authorization (EUA). This EUA will remain  in effect (meaning this test can be used) for the duration of the COVID-19 declaration under Section 564(b)(1) of the Act, 21 U.S.C.section 360bbb-3(b)(1), unless the authorization is terminated  or revoked sooner.       Influenza A by PCR NEGATIVE NEGATIVE    Influenza B by PCR NEGATIVE NEGATIVE    Comment: (NOTE) The Xpert Xpress SARS-CoV-2/FLU/RSV plus assay is intended as an aid in the diagnosis of influenza from Nasopharyngeal swab specimens and should not be used as a sole basis for treatment. Nasal washings and aspirates are unacceptable for Xpert Xpress SARS-CoV-2/FLU/RSV testing.  Fact Sheet for Patients: EntrepreneurPulse.com.au  Fact Sheet for Healthcare Providers: IncredibleEmployment.be  This test is not yet approved or  cleared by the [place] and has been authorized for detection and/or diagnosis of SARS-CoV-2 by FDA under an Emergency Use Authorization (EUA). This EUA will remain in effect (meaning this test can be used) for the duration of the COVID-19 declaration under Section 564(b)(1) of the Act, 21 U.S.C. section 360bbb-3(b)(1), unless the authorization is terminated or revoked.  Performed at [facility], [address] [place], [place] [address]     CT ABDOMEN PELVIS W CONTRAST  Result Date: 02/28/2020 CLINICAL DATA:  Constipation and abdominal pain EXAM: CT ABDOMEN AND PELVIS WITH CONTRAST TECHNIQUE: Multidetector CT imaging of the abdomen and pelvis was performed using the standard protocol following bolus administration of intravenous contrast. CONTRAST:  158mL OMNIPAQUE IOHEXOL 300 MG/ML  SOLN COMPARISON:  None. FINDINGS: Lower chest: Lung bases demonstrate no acute consolidation or effusion. Normal cardiac size. Hepatobiliary: Numerous subcentimeter hypodense liver lesions too small to further characterize. No calcified gallstone or biliary dilatation Pancreas: Unremarkable. No pancreatic ductal dilatation or surrounding inflammatory changes. Spleen: Normal in size without focal abnormality. Adrenals/Urinary Tract: Adrenal glands are within normal limits. Kidneys show no hydronephrosis. The bladder is unremarkable. Stomach/Bowel: The stomach is nonenlarged.  Fluid-filled nondistended small bowel within the mid to low abdomen. Moderate to large volume of feces throughout the colon. Nonvisualized appendix. Moderate retained feces at the rectum. Extraluminal perirectal gas, series 2, image number 71 with small amount of presacral fluid. Indistinct appearing anal rectal region with mild thickening. Gas within the subcutaneous soft tissues of the right medial gluteal region and in the right greater than left posterior anal rectal region. Vascular/Lymphatic: Nonaneurysmal aorta.  No suspicious nodes. Reproductive: Prostate is unremarkable. Other: Small amount of presacral soft tissue stranding and gas. Musculoskeletal: Degenerative changes of the spine. No acute osseous abnormality. IMPRESSION: 1. Moderate to large volume of feces throughout the colon with moderate retained feces at the rectum. Extraluminal gas surrounding the rectum and extending through the anal sphincter complex consistent with a perforation. No drainable perianal abscess is seen. There is mild anorectal edema. Differential considerations could include trauma, perforated anal fistula, or possible stercoral proctitis complicated by perforation. 2. Fluid-filled nondistended small bowel within the mid to low abdomen, could be secondary to mild ileus. Critical Value/emergent results were called by telephone at the time of interpretation on 02/28/2020 at 5:12 pm to provider Dr. Rex Kras, who verbally acknowledged these results. Electronically Signed   By: Donavan Foil M.D.   On: 02/28/2020 17:12    Review of Systems  Gastrointestinal: Positive for rectal pain.  All other systems reviewed and are negative.  Blood pressure 134/80, pulse 91, temperature 97.9 F (36.6 C), temperature source Oral, resp. rate 18, height 5\' 6"  (1.676 m), weight 59.9 kg, SpO2 98 %. Physical Exam Constitutional:      Appearance: Normal appearance.  HENT:     Head: Normocephalic and atraumatic.     Mouth/Throat:     Mouth:  Mucous membranes are moist.  Eyes:     Pupils: Pupils are equal, round, and reactive to light.  Cardiovascular:     Rate and Rhythm: Normal rate. Rhythm irregular.     Pulses: Normal pulses.  Pulmonary:     Effort: Pulmonary effort is normal.     Breath sounds: Normal breath sounds.  Abdominal:     General: Abdomen is flat. There is no distension.     Palpations: Abdomen is soft. There is no mass.     Tenderness: There is no abdominal tenderness.  There is no guarding or rebound.     Hernia: No hernia is present.  Genitourinary:   Musculoskeletal:        General: Normal range of motion.     Cervical back: Normal range of motion and neck supple.  Skin:    General: Skin is warm.  Neurological:     General: No focal deficit present.     Mental Status: He is alert.  Psychiatric:        Mood and Affect: Mood normal.        Behavior: Behavior normal.     Assessment/Plan: Rectal perforation/proctitis/possible stercoral ulceration without signs of sepsis, peritonitis or significant cellulitis of the anoderm or perineum  Etiology unclear but has been started on Zosyn.  He is comfortable for now but may benefit from exam under anesthesia in the morning for further evaluation.  May require drainage and/or diversion depending on findings and etiology of this.  He could be managed medically if his pain improves with antibiotics as well.  Okay to have liquids until midnight with an n.p.o. after midnight for possible intervention in a.m. by L Dow.  Discussed plan of care with patient.  He voices understanding and agrees to proceed.  Osmin Welz A Chayson Charters 02/28/2020, 9:58 PM

## 2020-02-28 NOTE — H&P (Signed)
History and Physical    Jose Fuentes HBZ:[id number] DOB: 1940/10/02 DOA: 02/28/2020  PCP: Harlan Stains, MD   Patient coming from: Home   Chief Complaint: Rectal pain   HPI: Jose Fuentes is a 80 y.o. male with medical history significant for depression, hypertension, and paroxysmal atrial fibrillation not anticoagulated, now presenting to the emergency department for evaluation of rectal pain and constipation.  The patient reports several months of constipation for which she has been taking MiraLAX at home, last had a bowel movement 2 days ago that required quite a bit of straining, and then attempted an enema at home this morning but reports that the fluid came right back out, he developed severe pain, and passed some bright red blood which prompted his presentation to the ED.  He denies any recent fevers, chills, chest pain, vomiting, cough, or shortness of breath.  [facility] ED Course: Upon arrival to the ED, patient is found to be afebrile, saturating well on room air, mildly tachycardic initially, and with stable blood pressure.  Chemistry panel is unremarkable and CBC with slight and stable thrombocytopenia.  Patient was given fentanyl, magnesium citrate, and enema in the emergency department and manual disimpaction was attempted but the stool seemed to be loose.  CT of the abdomen and pelvis is notable for moderate to large volume of feces throughout the colon, moderate retained feces at the rectum, and extraluminal gas surrounding the rectum concerning for perforation without a drainable abscess.  Surgery was consulted by the ED physician, Zosyn was administered, and patient was transferred to [facility] for ongoing evaluation and management.  Review of Systems:  All other systems reviewed and apart from HPI, are negative.  Past Medical History:  Diagnosis Date  . Atrial fibrillation ([place])   . Depression   . History of melanoma   . Hyperlipidemia    . Hypertension   . Peyronie disease     Past Surgical History:  Procedure Laterality Date  . KNEE SURGERY Left    meniscal tear  . MELANOMA EXCISION    . Thumb replacement Left   . TONSILLECTOMY      Social History:   reports that he quit smoking about 51 years ago. He has never used smokeless tobacco. He reports current alcohol use. He reports that he does not use drugs.  Allergies  Allergen Reactions  . Azithromycin Diarrhea  . Escitalopram Oxalate Other (See Comments)    Worsening mood swings  . Fluoxetine Hcl Other (See Comments)    Worsening mood swings    Family History  Problem Relation Age of Onset  . Heart attack Father        died in his sleep age 37  . Kidney failure Mother   . Melanoma Brother   . Melanoma Brother   . Glaucoma Sister   . Cancer Sister        glioblastoma     Prior to Admission medications   Medication Sig Start Date End Date Taking? Authorizing Provider  aspirin EC 81 MG tablet Take 1 tablet (81 mg total) by mouth daily. Swallow whole. 02/15/20 02/14/21 Yes Lavina Hamman, MD  atorvastatin (LIPITOR) 40 MG tablet Take 40 mg by mouth daily with supper.   Yes [provider]  buPROPion (WELLBUTRIN XL) 300 MG 24 hr tablet Take 300 mg by mouth daily before supper.   Yes [provider]  Cholecalciferol (VITAMIN D-3) 25 MCG (1000 UT) CAPS Take 1,000 Units by  mouth daily with supper.   Yes [provider]  desvenlafaxine 25 MG TB24 Take 75 mg by mouth daily. 02/15/20  Yes Lavina Hamman, MD  folic acid (FOLVITE) 1 MG tablet Take 1 tablet (1 mg total) by mouth daily. 02/16/20  Yes Lavina Hamman, MD  GLUCOSAMINE-CHONDROITIN PO Take 2 tablets by mouth daily with supper.   Yes [provider]  Multiple Vitamin (MULTIVITAMIN WITH MINERALS) TABS tablet Take 1 tablet by mouth daily. 02/16/20  Yes Lavina Hamman, MD  pantoprazole (PROTONIX) 40 MG tablet Take 40 mg by mouth daily before supper.   Yes [provider]  thiamine 100 MG tablet Take 1 tablet (100 mg total) by mouth daily. 02/16/20  Yes Lavina Hamman, MD  vitamin B-12 (CYANOCOBALAMIN) 1000 MCG tablet Take 1,000 mcg by mouth daily with supper.   Yes [provider]    Physical Exam: Vitals:   02/28/20 1700 02/28/20 1908 02/28/20 1928 02/28/20 2040  BP: 134/71 127/80 127/80 134/80  Pulse: (!) 103  97 91  Resp: 16 16 20 18   Temp:  (!) 97 F (36.1 C)  97.9 F (36.6 C)  TempSrc:    Oral  SpO2: 98% 97% 95% 98%  Weight:      Height:        Constitutional: NAD, calm  Eyes: PERTLA, lids and conjunctivae normal ENMT: Mucous membranes are moist. Posterior pharynx clear of any exudate or lesions.   Neck: normal, supple, no masses, no thyromegaly Respiratory: no wheezing, no crackles. No accessory muscle use.  Cardiovascular: S1 & S2 heard, regular rate and rhythm. No extremity edema.   Abdomen: No distension, soft. Bowel sounds active.  Musculoskeletal: no clubbing / cyanosis. No joint deformity upper and lower extremities.   Skin: no significant rashes, lesions, ulcers. Warm, dry, well-perfused. Neurologic: CN 2-12 grossly intact. Sensation intact. Moving all extremities.  Psychiatric: Alert and oriented to person, place, and situation. Pleasant and cooperative.    Labs and Imaging on Admission: I have personally reviewed following labs and imaging studies  CBC: Recent Labs  Lab 02/28/20 1543  WBC 9.2  NEUTROABS 8.0*  HGB 15.2  HCT 43.8  MCV 93.4  PLT 093*   Basic Metabolic Panel: Recent Labs  Lab 02/28/20 1543  NA 135  K 3.7  CL 100  CO2 25  GLUCOSE 136*  BUN 15  CREATININE 0.84  CALCIUM 9.2   GFR: Estimated Creatinine Clearance: 59.4 mL/min (by C-G formula based on SCr of 0.84 mg/dL). Liver Function Tests: No results for input(s): AST, ALT, ALKPHOS, BILITOT, PROT, ALBUMIN in the last 168 hours. Recent Labs  Lab 02/28/20 1543  LIPASE 37   No results for input(s): AMMONIA in the last  168 hours. Coagulation Profile: No results for input(s): INR, PROTIME in the last 168 hours. Cardiac Enzymes: No results for input(s): CKTOTAL, CKMB, CKMBINDEX, TROPONINI in the last 168 hours. BNP (last 3 results) No results for input(s): PROBNP in the last 8760 hours. HbA1C: No results for input(s): HGBA1C in the last 72 hours. CBG: No results for input(s): GLUCAP in the last 168 hours. Lipid Profile: No results for input(s): CHOL, HDL, LDLCALC, TRIG, CHOLHDL, LDLDIRECT in the last 72 hours. Thyroid Function Tests: No results for input(s): TSH, T4TOTAL, FREET4, T3FREE, THYROIDAB in the last 72 hours. Anemia Panel: No results for input(s): VITAMINB12, FOLATE, FERRITIN, TIBC, IRON, RETICCTPCT in the last 72 hours. Urine analysis:    Component Value Date/Time   COLORURINE YELLOW 02/14/2020  [place] 02/14/2020 0754   LABSPEC 1.015 02/14/2020 0754   PHURINE 6.0 02/14/2020 0754   GLUCOSEU NEGATIVE 02/14/2020 0754   HGBUR NEGATIVE 02/14/2020 0754   BILIRUBINUR NEGATIVE 02/14/2020 [place] 02/14/2020 0754   PROTEINUR NEGATIVE 02/14/2020 0754   UROBILINOGEN 0.2 05/06/2009 0145   NITRITE NEGATIVE 02/14/2020 0754   LEUKOCYTESUR NEGATIVE 02/14/2020 0754   Sepsis Labs: @LABRCNTIP (procalcitonin:4,lacticidven:4) ) Recent Results (from the past 240 hour(s))  Resp Panel by RT-PCR (Flu A&B, Covid) Nasopharyngeal Swab     Status: None   Collection Time: 02/28/20  6:04 PM   Specimen: Nasopharyngeal Swab; Nasopharyngeal(NP) swabs in vial transport medium  Result Value Ref Range Status   SARS Coronavirus 2 by RT PCR NEGATIVE NEGATIVE Final    Comment: (NOTE) SARS-CoV-2 target nucleic acids are NOT DETECTED.  The SARS-CoV-2 RNA is generally detectable in upper respiratory specimens during the acute phase of infection. The lowest concentration of SARS-CoV-2 viral copies this assay can detect is 138 copies/mL. A negative result does not preclude  SARS-Cov-2 infection and should not be used as the sole basis for treatment or other patient management decisions. A negative result may occur with  improper specimen collection/handling, submission of specimen other than nasopharyngeal swab, presence of viral mutation(s) within the areas targeted by this assay, and inadequate number of viral copies(<138 copies/mL). A negative result must be combined with clinical observations, patient history, and epidemiological information. The expected result is Negative.  Fact Sheet for Patients:  EntrepreneurPulse.com.au  Fact Sheet for Healthcare Providers:  IncredibleEmployment.be  This test is no t yet approved or cleared by the [place] FDA and  has been authorized for detection and/or diagnosis of SARS-CoV-2 by FDA under an Emergency Use Authorization (EUA). This EUA will remain  in effect (meaning this test can be used) for the duration of the COVID-19 declaration under Section 564(b)(1) of the Act, 21 U.S.C.section 360bbb-3(b)(1), unless the authorization is terminated  or revoked sooner.       Influenza A by PCR NEGATIVE NEGATIVE Final   Influenza B by PCR NEGATIVE NEGATIVE Final    Comment: (NOTE) The Xpert Xpress SARS-CoV-2/FLU/RSV plus assay is intended as an aid in the diagnosis of influenza from Nasopharyngeal swab specimens and should not be used as a sole basis for treatment. Nasal washings and aspirates are unacceptable for Xpert Xpress SARS-CoV-2/FLU/RSV testing.  Fact Sheet for Patients: EntrepreneurPulse.com.au  Fact Sheet for Healthcare Providers: IncredibleEmployment.be  This test is not yet approved or cleared by the [place] FDA and has been authorized for detection and/or diagnosis of SARS-CoV-2 by FDA under an Emergency Use Authorization (EUA). This EUA will remain in effect (meaning this test can be used) for the duration of  the COVID-19 declaration under Section 564(b)(1) of the Act, 21 U.S.C. section 360bbb-3(b)(1), unless the authorization is terminated or revoked.  Performed at [facility], [address] [place], [place] [address]      Radiological Exams on Admission: CT ABDOMEN PELVIS W CONTRAST  Result Date: 02/28/2020 CLINICAL DATA:  Constipation and abdominal pain EXAM: CT ABDOMEN AND PELVIS WITH CONTRAST TECHNIQUE: Multidetector CT imaging of the abdomen and pelvis was performed using the standard protocol following bolus administration of intravenous contrast. CONTRAST:  173mL OMNIPAQUE IOHEXOL 300 MG/ML  SOLN COMPARISON:  None. FINDINGS: Lower chest: Lung bases demonstrate no acute consolidation or effusion. Normal cardiac size. Hepatobiliary: Numerous subcentimeter hypodense liver lesions too small to further characterize. No calcified gallstone  or biliary dilatation Pancreas: Unremarkable. No pancreatic ductal dilatation or surrounding inflammatory changes. Spleen: Normal in size without focal abnormality. Adrenals/Urinary Tract: Adrenal glands are within normal limits. Kidneys show no hydronephrosis. The bladder is unremarkable. Stomach/Bowel: The stomach is nonenlarged. Fluid-filled nondistended small bowel within the mid to low abdomen. Moderate to large volume of feces throughout the colon. Nonvisualized appendix. Moderate retained feces at the rectum. Extraluminal perirectal gas, series 2, image number 71 with small amount of presacral fluid. Indistinct appearing anal rectal region with mild thickening. Gas within the subcutaneous soft tissues of the right medial gluteal region and in the right greater than left posterior anal rectal region. Vascular/Lymphatic: Nonaneurysmal aorta.  No suspicious nodes. Reproductive: Prostate is unremarkable. Other: Small amount of presacral soft tissue stranding and gas. Musculoskeletal: Degenerative changes of the spine. No acute osseous abnormality.  IMPRESSION: 1. Moderate to large volume of feces throughout the colon with moderate retained feces at the rectum. Extraluminal gas surrounding the rectum and extending through the anal sphincter complex consistent with a perforation. No drainable perianal abscess is seen. There is mild anorectal edema. Differential considerations could include trauma, perforated anal fistula, or possible stercoral proctitis complicated by perforation. 2. Fluid-filled nondistended small bowel within the mid to low abdomen, could be secondary to mild ileus. Critical Value/emergent results were called by telephone at the time of interpretation on 02/28/2020 at 5:12 pm to provider Dr. Rex Kras, who verbally acknowledged these results. Electronically Signed   By: Donavan Foil M.D.   On: 02/28/2020 17:12    Assessment/Plan   1. Suspected rectal perforation  - Patient with chronic constipation presents with severe rectal pain after attempting enema at home and has CT findings concerning for perforation  - Surgery is consulting and much appreciated, will continue Zosyn, bowel-rest, and pain-control for now    2. Paroxysmal atrial fibrillation  - In regular rhythm on admission  - Not anticoagulated due to falls   3. Depression  - Plan to continue home regimen pending pharmacy medication-reconciliation    DVT prophylaxis: SCDs   Code Status: Full  Level of Care: Level of care: Med-Surg Family Communication: None present at time of admission  Disposition Plan:  Patient is from: home  Anticipated d/c is to: TBD Anticipated d/c date is: 03/02/20 Patient currently: Pending surgical consultation, pain-control  Consults called: Surgery Admission status: Inpatient    Vianne Bulls, MD [facility]  02/28/2020, 11:07 PM

## 2020-02-28 NOTE — ED Notes (Signed)
Pt resting on stretcher. Still holding enema contents

## 2020-02-28 NOTE — ED Notes (Signed)
Pt ambulated to bedside commode without difficulty

## 2020-02-28 NOTE — ED Triage Notes (Signed)
Pt reports the he had bowel movement day before yesterday. Has had problem with constipation for the past six months. PPO recommended miralax but daily but patient has not been.  Patient reports being severely constipated and tried to give self enema and was unable to pass impaction. Pt reports bleeding and calling EMS. Recommended Emergency department.

## 2020-02-28 NOTE — ED Provider Notes (Signed)
[facility] EMERGENCY DEPARTMENT Provider Note   CSN: [id number] Arrival date & time: 02/28/20  1225     History Chief Complaint  Patient presents with  . Constipation    Jose Fuentes is a 80 y.o. male history of Parkinson's disease present emerge department constipation.  This is been ongoing issue for several months.  However reports has been 2 days since his last bowel movement.  He feels like he is straining too hard on the toilet not able to pass any gas or bowel movement.  He try to get himself an enema at home but could not get the fluid in.  He denies nausea, vomiting, or belching.  He does take MiraLAX as needed and has tried some but has not been able to produce a bowel movement.  HPI     Past Medical History:  Diagnosis Date  . Atrial fibrillation ([place])   . Depression   . History of melanoma   . Hyperlipidemia   . Hypertension   . Peyronie disease     Patient Active Problem List   Diagnosis Date Noted  . TIA (transient ischemic attack) 02/14/2020  . Gait instability 02/14/2020  . Alcohol use 02/14/2020  . Depression 02/14/2020  . GERD (gastroesophageal reflux disease) 02/14/2020  . AF (paroxysmal atrial fibrillation) ([place]) 06/11/2014    Past Surgical History:  Procedure Laterality Date  . KNEE SURGERY Left    meniscal tear  . MELANOMA EXCISION    . Thumb replacement Left   . TONSILLECTOMY         Family History  Problem Relation Age of Onset  . Heart attack Father        died in his sleep age 47  . Kidney failure Mother   . Melanoma Brother   . Melanoma Brother   . Glaucoma Sister   . Cancer Sister        glioblastoma    Social History   Tobacco Use  . Smoking status: Former Smoker    Quit date: 11/06/1968    Years since quitting: 51.3  . Smokeless tobacco: Never Used  Vaping Use  . Vaping Use: Never used  Substance Use Topics  . Alcohol use: Yes    Alcohol/week: 0.0 standard drinks    Comment: daily  . Drug use:  No    Home Medications Prior to Admission medications   Medication Sig Start Date End Date Taking? Authorizing Provider  aspirin EC 81 MG tablet Take 1 tablet (81 mg total) by mouth daily. Swallow whole. 02/15/20 02/14/21 Yes Lavina Hamman, MD  atorvastatin (LIPITOR) 40 MG tablet Take 40 mg by mouth daily with supper.   Yes [provider]  buPROPion (WELLBUTRIN XL) 300 MG 24 hr tablet Take 300 mg by mouth daily before supper.   Yes [provider]  Cholecalciferol (VITAMIN D-3) 25 MCG (1000 UT) CAPS Take 1,000 Units by mouth daily with supper.   Yes [provider]  desvenlafaxine 25 MG TB24 Take 75 mg by mouth daily. 02/15/20  Yes Lavina Hamman, MD  folic acid (FOLVITE) 1 MG tablet Take 1 tablet (1 mg total) by mouth daily. 02/16/20  Yes Lavina Hamman, MD  GLUCOSAMINE-CHONDROITIN PO Take 2 tablets by mouth daily with supper.   Yes [provider]  Multiple Vitamin (MULTIVITAMIN WITH MINERALS) TABS tablet Take 1 tablet by mouth daily. 02/16/20  Yes Lavina Hamman, MD  pantoprazole (PROTONIX) 40 MG tablet Take 40 mg by mouth daily  before supper.   Yes [provider]  thiamine 100 MG tablet Take 1 tablet (100 mg total) by mouth daily. 02/16/20  Yes Lavina Hamman, MD  vitamin B-12 (CYANOCOBALAMIN) 1000 MCG tablet Take 1,000 mcg by mouth daily with supper.   Yes [provider]    Allergies    Azithromycin, Escitalopram oxalate, and Fluoxetine hcl  Review of Systems   Review of Systems  Constitutional: Negative for chills and fever.  HENT: Negative for ear pain and sore throat.   Eyes: Negative for pain and visual disturbance.  Respiratory: Negative for cough and shortness of breath.   Cardiovascular: Negative for chest pain and palpitations.  Gastrointestinal: Positive for abdominal pain, blood in stool and constipation. Negative for nausea and vomiting.  Genitourinary: Negative for dysuria and hematuria.  Musculoskeletal:  Negative for arthralgias and myalgias.  Skin: Negative for color change and rash.  Neurological: Negative for syncope and headaches.  All other systems reviewed and are negative.   Physical Exam Updated Vital Signs BP (!) 110/91   Pulse 90   Temp 98.7 F (37.1 C) (Oral)   Resp 14   Ht 5\' 6"  (1.676 m)   Wt 59.9 kg   SpO2 95%   BMI 21.31 kg/m   Physical Exam Constitutional:      General: He is not in acute distress. HENT:     Head: Normocephalic and atraumatic.  Eyes:     Conjunctiva/sclera: Conjunctivae normal.     Pupils: Pupils are equal, round, and reactive to light.  Cardiovascular:     Rate and Rhythm: Normal rate and regular rhythm.  Pulmonary:     Effort: Pulmonary effort is normal. No respiratory distress.  Abdominal:     General: There is no distension.     Tenderness: There is no abdominal tenderness.  Genitourinary:    Comments: Rectal exam with RN chaperone present Tedd Sias Internal hemorrhoid noted on exam Soft stool palpable with distal fingertip in rectal vault Skin:    General: Skin is warm and dry.  Neurological:     General: No focal deficit present.     Mental Status: He is alert. Mental status is at baseline.     ED Results / Procedures / Treatments   Labs (all labs ordered are listed, but only abnormal results are displayed) Labs Reviewed  BASIC METABOLIC PANEL  CBC WITH DIFFERENTIAL/PLATELET  LIPASE, BLOOD    EKG None  Radiology No results found.  Procedures Procedures   Medications Ordered in ED Medications  magnesium citrate solution 1 Bottle (1 Bottle Oral Given 02/28/20 1303)  mineral oil enema 1 enema (1 enema Rectal Given 02/28/20 1338)  lidocaine (XYLOCAINE) 2 % jelly 1 application (1 application Other Given 02/28/20 1303)  lidocaine (XYLOCAINE) 2 % jelly 1 application (1 application Other Given 02/28/20 1512)  fentaNYL (SUBLIMAZE) injection 75 mcg (75 mcg Intravenous Given 02/28/20 1521)    ED Course  I have reviewed  the triage vital signs and the nursing notes.  Pertinent labs & imaging results that were available during my care of the patient were reviewed by me and considered in my medical decision making (see chart for details).  80 yo male here with reported constipation and rectal fullness for 2 days. I cannot reach the stool adequately for disimpaction, but it does feel soft to me.  We'll try mag citrate and an enema Urojet jelly may help for bowel movement  Clinical Course as of 02/28/20 1538  Thu Feb 28, 2020  1531 Attempted disimpaction, stool is loose removed several fingers of stool, patient having significant perirectal pain which may be related to fissure.  With his degree of pain and inability to void, I've ordered a CT abdomen pelvis with IV contrast.  I've also ordered fentanyl for pain.  He has no abdominal pain to suggest perforation at this time - it appears largely perirectal, but I wonder about ogalvie's syndrome. [MT]  1532 Patient signed out to Dr Rex Kras EDP pending CT scan [MT]    Clinical Course User Index [MT] Lakishia Bourassa, Carola Rhine, MD    Final Clinical Impression(s) / ED Diagnoses Final diagnoses:  None    Rx / DC Orders ED Discharge Orders    None       Wyvonnia Dusky, MD 02/28/20 1538

## 2020-02-28 NOTE — ED Provider Notes (Signed)
I received this patient in signout from Dr. Langston Masker. He had presented w/ constipation and had significant pain during rectal exam therefore CT was ordered.  Imaging notable for constipation as well as extraluminal gas around the rectum consistent with perforation.  No obvious abscess.  Discussed findings with general surgeon Dr. Brantley Stage who recommended IV antibiotics, bowel rest, and admission to medicine.  They will follow along.   IMPRESSION: 1. Moderate to large volume of feces throughout the colon with moderate retained feces at the rectum. Extraluminal gas surrounding the rectum and extending through the anal sphincter complex consistent with a perforation. No drainable perianal abscess is seen. There is mild anorectal edema. Differential considerations could include trauma, perforated anal fistula, or possible stercoral proctitis complicated by perforation. 2. Fluid-filled nondistended small bowel within the mid to low abdomen, could be secondary to mild ileus.   Discussed admission w/ [facility].  Pt unable to urinate, will place foley; urinary retention likely 2/2 severe constipation.    Jose Fuentes, Wenda Overland, MD 02/28/20 [id number]

## 2020-02-29 ENCOUNTER — Encounter (HOSPITAL_COMMUNITY): Payer: Self-pay | Admitting: Family Medicine

## 2020-02-29 ENCOUNTER — Inpatient Hospital Stay (HOSPITAL_COMMUNITY): Payer: Medicare Other | Admitting: Anesthesiology

## 2020-02-29 ENCOUNTER — Encounter (HOSPITAL_COMMUNITY)
Admission: EM | Disposition: A | Discharge status: Home / Self Care | Payer: Self-pay | Source: Home / Self Care | Attending: Internal Medicine

## 2020-02-29 DIAGNOSIS — K631 Perforation of intestine (nontraumatic): Secondary | ICD-10-CM | POA: Diagnosis not present

## 2020-02-29 LAB — BASIC METABOLIC PANEL WITH GFR
Anion gap: 9 (ref 5–15)
BUN: 14 mg/dL (ref 8–23)
CO2: 26 mmol/L (ref 22–32)
Calcium: 8.4 mg/dL — ABNORMAL LOW (ref 8.9–10.3)
Chloride: 101 mmol/L (ref 98–111)
Creatinine, Ser: 0.83 mg/dL (ref 0.61–1.24)
GFR, Estimated: >60 mL/min (ref >60)
Glucose, Bld: 121 mg/dL — ABNORMAL HIGH (ref 70–99)
Potassium: 3.7 mmol/L (ref 3.5–5.1)
Sodium: 136 mmol/L (ref 135–145)

## 2020-02-29 LAB — CBC
HCT: 40.8 % (ref 39.0–52.0)
Hemoglobin: 13.9 g/dL (ref 13.0–17.0)
MCH: 31.9 pg (ref 26.0–34.0)
MCHC: 34.1 g/dL (ref 30.0–36.0)
MCV: 93.6 fL (ref 80.0–100.0)
Platelets: 142 K/uL — ABNORMAL LOW (ref 150–400)
RBC: 4.36 MIL/uL (ref 4.22–5.81)
RDW: 14.2 % (ref 11.5–15.5)
WBC: 10.6 K/uL — ABNORMAL HIGH (ref 4.0–10.5)
nRBC: 0 % (ref 0.0–0.2)

## 2020-02-29 SURGERY — EXAM UNDER ANESTHESIA
Anesthesia: General

## 2020-02-29 MED ORDER — ONDANSETRON HCL 4 MG/2ML IJ SOLN
INTRAMUSCULAR | Status: AC
  Filled 2020-02-29: qty 2

## 2020-02-29 MED ORDER — PROPOFOL 10 MG/ML IV BOLUS
INTRAVENOUS | Status: AC
  Filled 2020-02-29: qty 20

## 2020-02-29 MED ORDER — ATORVASTATIN CALCIUM 40 MG PO TABS
40.0000 mg | ORAL_TABLET | Freq: Every day | ORAL | Status: DC
Start: 2020-02-29 — End: 2020-03-01
  Administered 2020-02-29: 40 mg via ORAL
  Filled 2020-02-29: qty 1

## 2020-02-29 MED ORDER — BUPIVACAINE HCL 0.25 % IJ SOLN
INTRAMUSCULAR | Status: AC
  Filled 2020-02-29: qty 1

## 2020-02-29 MED ORDER — DEXAMETHASONE SODIUM PHOSPHATE 10 MG/ML IJ SOLN
INTRAMUSCULAR | Status: AC
  Filled 2020-02-29: qty 1

## 2020-02-29 MED ORDER — POLYETHYLENE GLYCOL 3350 17 G PO PACK
17.0000 g | PACK | Freq: Two times a day (BID) | ORAL | Status: DC
  Administered 2020-02-29 – 2020-03-01 (×2): 17 g via ORAL
  Filled 2020-02-29 (×2): qty 1

## 2020-02-29 MED ORDER — DESVENLAFAXINE SUCCINATE ER 25 MG PO TB24: 75.0000 mg | ORAL_TABLET | Freq: Every day | ORAL | Status: DC

## 2020-02-29 MED ORDER — PHENYLEPHRINE 40 MCG/ML (10ML) SYRINGE FOR IV PUSH (FOR BLOOD PRESSURE SUPPORT)
PREFILLED_SYRINGE | INTRAVENOUS | Status: AC
  Filled 2020-02-29: qty 10

## 2020-02-29 MED ORDER — FENTANYL CITRATE (PF) 100 MCG/2ML IJ SOLN: INTRAMUSCULAR | Status: DC | PRN

## 2020-02-29 MED ORDER — DEXAMETHASONE SODIUM PHOSPHATE 10 MG/ML IJ SOLN
INTRAMUSCULAR | Status: DC | PRN
  Administered 2020-02-29: 5 mg via INTRAVENOUS

## 2020-02-29 MED ORDER — LACTATED RINGERS IV SOLN: INTRAVENOUS | Status: DC

## 2020-02-29 MED ORDER — FENTANYL CITRATE (PF) 100 MCG/2ML IJ SOLN: 25.0000 ug | INTRAMUSCULAR | Status: DC | PRN

## 2020-02-29 MED ORDER — BUPIVACAINE LIPOSOME 1.3 % IJ SUSP
20.0000 mL | Freq: Once | INTRAMUSCULAR | Status: AC
  Administered 2020-02-29: 20 mL
  Filled 2020-02-29: qty 20

## 2020-02-29 MED ORDER — BUPIVACAINE-EPINEPHRINE (PF) 0.25% -1:200000 IJ SOLN
INTRAMUSCULAR | Status: DC | PRN
  Administered 2020-02-29: 30 mL

## 2020-02-29 MED ORDER — LIDOCAINE 2% (20 MG/ML) 5 ML SYRINGE
INTRAMUSCULAR | Status: DC | PRN
  Administered 2020-02-29: 100 mg via INTRAVENOUS

## 2020-02-29 MED ORDER — SUGAMMADEX SODIUM 500 MG/5ML IV SOLN
INTRAVENOUS | Status: AC
  Filled 2020-02-29: qty 5

## 2020-02-29 MED ORDER — 0.9 % SODIUM CHLORIDE (POUR BTL) OPTIME
TOPICAL | Status: DC | PRN
  Administered 2020-02-29: 1000 mL

## 2020-02-29 MED ORDER — PHENYLEPHRINE 40 MCG/ML (10ML) SYRINGE FOR IV PUSH (FOR BLOOD PRESSURE SUPPORT)
PREFILLED_SYRINGE | INTRAVENOUS | Status: DC | PRN
  Administered 2020-02-29 (×3): 80 ug via INTRAVENOUS

## 2020-02-29 MED ORDER — CELECOXIB 200 MG PO CAPS: 200.0000 mg | ORAL_CAPSULE | Freq: Once | ORAL | Status: DC

## 2020-02-29 MED ORDER — BUPIVACAINE-EPINEPHRINE (PF) 0.25% -1:200000 IJ SOLN
INTRAMUSCULAR | Status: AC
  Filled 2020-02-29: qty 30

## 2020-02-29 MED ORDER — CHLORHEXIDINE GLUCONATE CLOTH 2 % EX PADS
6.0000 | MEDICATED_PAD | Freq: Every day | CUTANEOUS | Status: DC
  Administered 2020-02-29: 6 via TOPICAL

## 2020-02-29 MED ORDER — ROCURONIUM BROMIDE 10 MG/ML (PF) SYRINGE
PREFILLED_SYRINGE | INTRAVENOUS | Status: DC | PRN
  Administered 2020-02-29: 80 mg via INTRAVENOUS

## 2020-02-29 MED ORDER — DOCUSATE SODIUM 100 MG PO CAPS
100.0000 mg | ORAL_CAPSULE | Freq: Two times a day (BID) | ORAL | Status: DC
  Administered 2020-02-29 – 2020-03-01 (×2): 100 mg via ORAL
  Filled 2020-02-29 (×2): qty 1

## 2020-02-29 MED ORDER — PROPOFOL 10 MG/ML IV BOLUS
INTRAVENOUS | Status: DC | PRN
  Administered 2020-02-29: 150 mg via INTRAVENOUS

## 2020-02-29 MED ORDER — ONDANSETRON HCL 4 MG/2ML IJ SOLN
INTRAMUSCULAR | Status: DC | PRN
  Administered 2020-02-29: 4 mg via INTRAVENOUS

## 2020-02-29 MED ORDER — AMISULPRIDE (ANTIEMETIC) 5 MG/2ML IV SOLN: 10.0000 mg | Freq: Once | INTRAVENOUS | Status: DC | PRN

## 2020-02-29 MED ORDER — PANTOPRAZOLE SODIUM 40 MG PO TBEC
40.0000 mg | DELAYED_RELEASE_TABLET | Freq: Every day | ORAL | Status: DC
  Administered 2020-02-29: 40 mg via ORAL
  Filled 2020-02-29: qty 1

## 2020-02-29 MED ORDER — FENTANYL CITRATE (PF) 100 MCG/2ML IJ SOLN
INTRAMUSCULAR | Status: AC
  Filled 2020-02-29: qty 2

## 2020-02-29 MED ORDER — CHLORHEXIDINE GLUCONATE 0.12 % MT SOLN
15.0000 mL | Freq: Once | OROMUCOSAL | Status: AC
  Administered 2020-02-29: 15 mL via OROMUCOSAL

## 2020-02-29 MED ORDER — FENTANYL CITRATE (PF) 100 MCG/2ML IJ SOLN
INTRAMUSCULAR | Status: DC | PRN
  Administered 2020-02-29: 100 ug via INTRAVENOUS

## 2020-02-29 MED ORDER — BUPROPION HCL ER (XL) 300 MG PO TB24
300.0000 mg | ORAL_TABLET | Freq: Every day | ORAL | Status: DC
Start: 2020-02-29 — End: 2020-03-01
  Administered 2020-02-29: 300 mg via ORAL
  Filled 2020-02-29: qty 1

## 2020-02-29 MED ORDER — SUGAMMADEX SODIUM 200 MG/2ML IV SOLN
INTRAVENOUS | Status: DC | PRN
  Administered 2020-02-29: 300 mg via INTRAVENOUS

## 2020-02-29 MED ORDER — LIDOCAINE HCL (PF) 2 % IJ SOLN
INTRAMUSCULAR | Status: AC
  Filled 2020-02-29: qty 5

## 2020-02-29 MED ORDER — ACETAMINOPHEN 500 MG PO TABS: 1000.0000 mg | ORAL_TABLET | Freq: Once | ORAL | Status: DC

## 2020-02-29 SURGICAL SUPPLY — 17 items
BLADE SURG SZ20 CARB STEEL (BLADE) ×2 IMPLANT
COVER SURGICAL LIGHT HANDLE (MISCELLANEOUS) ×2 IMPLANT
COVER WAND RF STERILE (DRAPES) IMPLANT
DRAPE SHEET LG 3/4 BI-LAMINATE (DRAPES) ×2 IMPLANT
GAUZE 4X4 16PLY RFD (DISPOSABLE) ×2 IMPLANT
GAUZE SPONGE 4X4 12PLY STRL (GAUZE/BANDAGES/DRESSINGS) ×2 IMPLANT
GLOVE BIOGEL M 8.0 STRL (GLOVE) ×2 IMPLANT
GLOVE SURG UNDER POLY LF SZ7 (GLOVE) ×2 IMPLANT
GOWN SPEC L4 XLG W/TWL (GOWN DISPOSABLE) ×2 IMPLANT
GOWN STRL REUS W/TWL LRG LVL3 (GOWN DISPOSABLE) ×2 IMPLANT
GOWN STRL REUS W/TWL XL LVL3 (GOWN DISPOSABLE) ×6 IMPLANT
KIT BASIN OR (CUSTOM PROCEDURE TRAY) ×2 IMPLANT
KIT TURNOVER KIT A (KITS) ×2 IMPLANT
PACK LITHOTOMY IV (CUSTOM PROCEDURE TRAY) ×2 IMPLANT
PENCIL SMOKE EVACUATOR (MISCELLANEOUS) IMPLANT
SWAB CULTURE ESWAB REG 1ML (MISCELLANEOUS) ×2 IMPLANT
TOWEL OR 17X26 10 PK STRL BLUE (TOWEL DISPOSABLE) ×2 IMPLANT

## 2020-02-29 NOTE — Op Note (Signed)
   Patient: Jose Fuentes ([birth year], [id number])  Date of Surgery:   Preoperative Diagnosis: Peri-rectal Trauma  Postoperative Diagnosis: Peri-rectal Trauma  Surgical Procedure: EXAM UNDER ANESTHESIA:    Operative Team Members:  Surgeon(s) and Role:    * Sailor Haughn, Nickola Major, MD - Primary  Bowler (Colorectal Surgery)  Anesthesiologist: Myrtie Soman, MD; Duane Boston, MD CRNA: Maxwell Caul, CRNA   Anesthesia: General   Fluids:  Total I/O In: 114 [IV Piggyback:114] Out: 205 [CWUGQ:916; XIHWT:8]  Complications: None  Drains:  none   Specimen: None  Disposition:  PACU - hemodynamically stable.  Plan of Care: Admit for overnight observation   We will monitor the patient in the hospital overnight on antibiotics, check his blood work in the morning, and likely discharge tomorrow.    Indications for Procedure: [name] is a 80 y.o. male who presented with anal pain. He denies any history of recent trauma in the area, however he did attempt an enema recently according to his wife. CT scan was concerning for possible rectal or anal trauma with gas but no fluid in the perirectal and perianal tissues. Recommendation was made to proceed with exam under anesthesia. The procedure itself as well as its risk, benefits, and alternatives were discussed the patient granted consent to proceed.  Findings: Evidence of bruising and trauma, focused around the posterior aspect of the anus without any abscess or other abnormality  Description of Procedure: On the date stated above patient to the operating room suite placed in prone position. General endotracheal anesthesia was induced. A timeout was completed verifying the correct patient, procedure, position, and equipment needed for the case. The patient's anus was prepped and draped in usual sterile fashion and we began with our exam. An anoscope was inserted in the anus and the anal tissues and distal  rectum were inspected. He had a normal appearance other than some bruising around the posterior perianal skin. There were no areas of induration or fluctuance. A regional block was injected with Exparel and Marcaine for pain control. The patient was then awoken from anesthesia and transferred the postanesthesia care unit in stable condition.    Louanna Raw, MD General, Bariatric, & Minimally Invasive Surgery [facility], [place]

## 2020-02-29 NOTE — Progress Notes (Signed)
PROGRESS NOTE    Asim Gersten  PTW:[id number] DOB: 09/13/1940 DOA: 02/28/2020 PCP: Harlan Stains, MD     Brief Narrative:  Jose Fuentes is a 80 y.o. male with medical history significant for depression, hypertension, and paroxysmal atrial fibrillation not anticoagulated, now presenting to the emergency department for evaluation of rectal pain and constipation.  The patient reports several months of constipation for which he has been taking MiraLAX at home, last had a bowel movement 2 days ago that required quite a bit of straining, and then attempted an enema at home but reports that the fluid came right back out, he developed severe pain, and passed some bright red blood which prompted his presentation to the ED.  He denies any recent fevers, chills, chest pain, vomiting, cough, or shortness of breath. CT of the abdomen and pelvis is notable for moderate to large volume of feces throughout the colon, moderate retained feces at the rectum, and extraluminal gas surrounding the rectum concerning for perforation without a drainable abscess.  Surgery was consulted.  New events last 24 hours / Subjective: Continues to have rectal pain. Denies any nausea, vomiting, abdominal pain. Awaiting OR later this morning.  Assessment & Plan:   Principal Problem:   Rectal perforation (HCC) Active Problems:   AF (paroxysmal atrial fibrillation) (HCC)   Depression   Rectal perforation -CT abdomen pelvis notable for moderate to large volume of feces throughout the colon, moderate retained feces at the rectum, and extraluminal gas surrounding the rectum concerning for perforation without a drainable abscess -IV Zosyn -General surgery following, planning for evaluation under anesthesia later this morning  Paroxysmal atrial fibrillation -Currently not on anticoagulation as an outpatient due to falls and gait instability  Prediabetes -Hemoglobin A1c 5.8  Depression -Continue Wellbutrin,  Pristiq  Hyperlipidemia -Continue atorvastatin  GERD -Continue Protonix   DVT prophylaxis:  SCDs Start: 02/28/20 2137  Code Status: Full code Family Communication: No family at bedside Disposition Plan:  Status is: Inpatient  Remains inpatient appropriate because:Ongoing diagnostic testing needed not appropriate for outpatient work up   Dispo: The patient is from: Home              Anticipated d/c is to: Home              Patient currently is not medically stable to d/c.   Difficult to place patient No      Consultants:   General surgery   Procedures:   None   Antimicrobials:  Anti-infectives (From admission, onward)   Start     Dose/Rate Route Frequency Ordered Stop   02/29/20 0200  piperacillin-tazobactam (ZOSYN) IVPB 3.375 g        3.375 g 12.5 mL/hr over 240 Minutes Intravenous Every 8 hours 02/28/20 2200     02/28/20 1745  piperacillin-tazobactam (ZOSYN) IVPB 3.375 g        3.375 g 100 mL/hr over 30 Minutes Intravenous  Once 02/28/20 1740 02/28/20 1908        Objective: Vitals:   02/28/20 2040 02/29/20 0058 02/29/20 0437 02/29/20 0843  BP: 134/80 118/75 108/66 113/71  Pulse: 91 91 83 78  Resp: 18 17 19 16   Temp: 97.9 F (36.6 C) 98 F (36.7 C) 98.2 F (36.8 C) 98.8 F (37.1 C)  TempSrc: Oral Oral Oral Oral  SpO2: 98% 99% 98% 97%  Weight:      Height:        Intake/Output Summary (Last 24 hours) at 02/29/2020 1006 Last data  filed at 02/29/2020 0845 Gross per 24 hour  Intake 698.55 ml  Output 900 ml  Net -201.45 ml   Filed Weights   02/28/20 1241  Weight: 59.9 kg    Examination: General exam: Appears calm and comfortable  Respiratory system: Clear to auscultation. Respiratory effort normal. No respiratory distress. No conversational dyspnea.  Cardiovascular system: S1 & S2 heard, RRR. No murmurs. No pedal edema. Gastrointestinal system: Abdomen is nondistended, soft and nontender. Normal bowel sounds heard. Central nervous system:  Alert and oriented. No focal neurological deficits. Speech clear.  Extremities: Symmetric in appearance  Skin: No rashes, lesions or ulcers on exposed skin  Psychiatry: Judgement and insight appear normal. Mood & affect appropriate.   Data Reviewed: I have personally reviewed following labs and imaging studies  CBC: Recent Labs  Lab 02/28/20 1543 02/29/20 0342  WBC 9.2 10.6*  NEUTROABS 8.0*  --   HGB 15.2 13.9  HCT 43.8 40.8  MCV 93.4 93.6  PLT 142* 062*   Basic Metabolic Panel: Recent Labs  Lab 02/28/20 1543 02/29/20 0342  NA 135 136  K 3.7 3.7  CL 100 101  CO2 25 26  GLUCOSE 136* 121*  BUN 15 14  CREATININE 0.84 0.83  CALCIUM 9.2 8.4*   GFR: Estimated Creatinine Clearance: 60.1 mL/min (by C-G formula based on SCr of 0.83 mg/dL). Liver Function Tests: No results for input(s): AST, ALT, ALKPHOS, BILITOT, PROT, ALBUMIN in the last 168 hours. Recent Labs  Lab 02/28/20 1543  LIPASE 37   No results for input(s): AMMONIA in the last 168 hours. Coagulation Profile: No results for input(s): INR, PROTIME in the last 168 hours. Cardiac Enzymes: No results for input(s): CKTOTAL, CKMB, CKMBINDEX, TROPONINI in the last 168 hours. BNP (last 3 results) No results for input(s): PROBNP in the last 8760 hours. HbA1C: No results for input(s): HGBA1C in the last 72 hours. CBG: No results for input(s): GLUCAP in the last 168 hours. Lipid Profile: No results for input(s): CHOL, HDL, LDLCALC, TRIG, CHOLHDL, LDLDIRECT in the last 72 hours. Thyroid Function Tests: No results for input(s): TSH, T4TOTAL, FREET4, T3FREE, THYROIDAB in the last 72 hours. Anemia Panel: No results for input(s): VITAMINB12, FOLATE, FERRITIN, TIBC, IRON, RETICCTPCT in the last 72 hours. Sepsis Labs: No results for input(s): PROCALCITON, LATICACIDVEN in the last 168 hours.  Recent Results (from the past 240 hour(s))  Resp Panel by RT-PCR (Flu A&B, Covid) Nasopharyngeal Swab     Status: None   Collection  Time: 02/28/20  6:04 PM   Specimen: Nasopharyngeal Swab; Nasopharyngeal(NP) swabs in vial transport medium  Result Value Ref Range Status   SARS Coronavirus 2 by RT PCR NEGATIVE NEGATIVE Final    Comment: (NOTE) SARS-CoV-2 target nucleic acids are NOT DETECTED.  The SARS-CoV-2 RNA is generally detectable in upper respiratory specimens during the acute phase of infection. The lowest concentration of SARS-CoV-2 viral copies this assay can detect is 138 copies/mL. A negative result does not preclude SARS-Cov-2 infection and should not be used as the sole basis for treatment or other patient management decisions. A negative result may occur with  improper specimen collection/handling, submission of specimen other than nasopharyngeal swab, presence of viral mutation(s) within the areas targeted by this assay, and inadequate number of viral copies(<138 copies/mL). A negative result must be combined with clinical observations, patient history, and epidemiological information. The expected result is Negative.  Fact Sheet for Patients:  EntrepreneurPulse.com.au  Fact Sheet for Healthcare Providers:  IncredibleEmployment.be  This test is  no t yet approved or cleared by the [place] and  has been authorized for detection and/or diagnosis of SARS-CoV-2 by FDA under an Emergency Use Authorization (EUA). This EUA will remain  in effect (meaning this test can be used) for the duration of the COVID-19 declaration under Section 564(b)(1) of the Act, 21 U.S.C.section 360bbb-3(b)(1), unless the authorization is terminated  or revoked sooner.       Influenza A by PCR NEGATIVE NEGATIVE Final   Influenza B by PCR NEGATIVE NEGATIVE Final    Comment: (NOTE) The Xpert Xpress SARS-CoV-2/FLU/RSV plus assay is intended as an aid in the diagnosis of influenza from Nasopharyngeal swab specimens and should not be used as a sole basis for treatment. Nasal washings  and aspirates are unacceptable for Xpert Xpress SARS-CoV-2/FLU/RSV testing.  Fact Sheet for Patients: EntrepreneurPulse.com.au  Fact Sheet for Healthcare Providers: IncredibleEmployment.be  This test is not yet approved or cleared by the [place] FDA and has been authorized for detection and/or diagnosis of SARS-CoV-2 by FDA under an Emergency Use Authorization (EUA). This EUA will remain in effect (meaning this test can be used) for the duration of the COVID-19 declaration under Section 564(b)(1) of the Act, 21 U.S.C. section 360bbb-3(b)(1), unless the authorization is terminated or revoked.  Performed at [facility], [address] [place], [place] [address]       Radiology Studies: CT ABDOMEN PELVIS W CONTRAST  Result Date: 02/28/2020 CLINICAL DATA:  Constipation and abdominal pain EXAM: CT ABDOMEN AND PELVIS WITH CONTRAST TECHNIQUE: Multidetector CT imaging of the abdomen and pelvis was performed using the standard protocol following bolus administration of intravenous contrast. CONTRAST:  186mL OMNIPAQUE IOHEXOL 300 MG/ML  SOLN COMPARISON:  None. FINDINGS: Lower chest: Lung bases demonstrate no acute consolidation or effusion. Normal cardiac size. Hepatobiliary: Numerous subcentimeter hypodense liver lesions too small to further characterize. No calcified gallstone or biliary dilatation Pancreas: Unremarkable. No pancreatic ductal dilatation or surrounding inflammatory changes. Spleen: Normal in size without focal abnormality. Adrenals/Urinary Tract: Adrenal glands are within normal limits. Kidneys show no hydronephrosis. The bladder is unremarkable. Stomach/Bowel: The stomach is nonenlarged. Fluid-filled nondistended small bowel within the mid to low abdomen. Moderate to large volume of feces throughout the colon. Nonvisualized appendix. Moderate retained feces at the rectum. Extraluminal perirectal gas, series 2, image number 71  with small amount of presacral fluid. Indistinct appearing anal rectal region with mild thickening. Gas within the subcutaneous soft tissues of the right medial gluteal region and in the right greater than left posterior anal rectal region. Vascular/Lymphatic: Nonaneurysmal aorta.  No suspicious nodes. Reproductive: Prostate is unremarkable. Other: Small amount of presacral soft tissue stranding and gas. Musculoskeletal: Degenerative changes of the spine. No acute osseous abnormality. IMPRESSION: 1. Moderate to large volume of feces throughout the colon with moderate retained feces at the rectum. Extraluminal gas surrounding the rectum and extending through the anal sphincter complex consistent with a perforation. No drainable perianal abscess is seen. There is mild anorectal edema. Differential considerations could include trauma, perforated anal fistula, or possible stercoral proctitis complicated by perforation. 2. Fluid-filled nondistended small bowel within the mid to low abdomen, could be secondary to mild ileus. Critical Value/emergent results were called by telephone at the time of interpretation on 02/28/2020 at 5:12 pm to provider Dr. Rex Kras, who verbally acknowledged these results. Electronically Signed   By: Donavan Foil M.D.   On: 02/28/2020 17:12      Scheduled Meds: . Chlorhexidine Gluconate Cloth  6 each Topical Daily  . pantoprazole (PROTONIX) IV  40 mg Intravenous Q24H   Continuous Infusions: . sodium chloride 75 mL/hr at 02/28/20 2230  . piperacillin-tazobactam (ZOSYN)  IV 3.375 g (02/29/20 0143)     LOS: 1 day      Time spent: 30 minutes   Dessa Phi, DO [facility] 02/29/2020, 10:06 AM   Available via Epic secure chat 7am-7pm After these hours, please refer to coverage provider listed on [email or web address]

## 2020-02-29 NOTE — Anesthesia Procedure Notes (Signed)
Procedure Name: Intubation Date/Time: 02/29/2020 11:06 AM Performed by: Maxwell Caul, CRNA Pre-anesthesia Checklist: Patient identified, Emergency Drugs available, Suction available and Patient being monitored Patient Re-evaluated:Patient Re-evaluated prior to induction Oxygen Delivery Method: Circle system utilized Preoxygenation: Pre-oxygenation with 100% oxygen Induction Type: IV induction Ventilation: Mask ventilation without difficulty Laryngoscope Size: Mac and 4 Grade View: Grade II Tube type: Oral Tube size: 7.5 mm Number of attempts: 1 Airway Equipment and Method: Stylet Placement Confirmation: ETT inserted through vocal cords under direct vision,  positive ETCO2 and breath sounds checked- equal and bilateral Secured at: 21 cm Tube secured with: Tape Dental Injury: Teeth and Oropharynx as per pre-operative assessment

## 2020-02-29 NOTE — Transfer of Care (Signed)
Immediate Anesthesia Transfer of Care Note  Patient: Jose Fuentes  Procedure(s) Performed: Jasmine December UNDER ANESTHESIA (N/A )  Patient Location: PACU  Anesthesia Type:General  Level of Consciousness: awake, alert  and oriented  Airway & Oxygen Therapy: Patient Spontanous Breathing and Patient connected to face mask oxygen  Post-op Assessment: Report given to RN and Post -op Vital signs reviewed and stable  Post vital signs: Reviewed and stable  Last Vitals:  Vitals Value Taken Time  BP 136/72 02/29/20 1152  Temp    Pulse 77 02/29/20 1154  Resp 15 02/29/20 1154  SpO2 100 % 02/29/20 1154  Vitals shown include unvalidated device data.  Last Pain:  Vitals:   02/29/20 0922  TempSrc:   PainSc: 8       Patients Stated Pain Goal: 2 (14/27/67 0110)  Complications: No complications documented.

## 2020-02-29 NOTE — Progress Notes (Signed)
Progress Note: General Surgery Service   Chief Complaint/Subjective: Continued anal pain.  He is concerned about the severity of this illness.  Objective: Vital signs in last 24 hours: Temp:  [97 F (36.1 C)-98.8 F (37.1 C)] 98.8 F (37.1 C) (02/25 0843) Pulse Rate:  [75-112] 78 (02/25 0843) Resp:  [14-20] 16 (02/25 0843) BP: (108-160)/(63-103) 113/71 (02/25 0843) SpO2:  [94 %-99 %] 97 % (02/25 0843) Weight:  [59.9 kg] 59.9 kg (02/24 1241) Last BM Date: 02/26/20  Intake/Output from previous day: 02/24 0701 - 02/25 0700 In: 698.6 [I.V.:598.4; IV Piggyback:100.2] Out: 700 [Urine:700] Intake/Output this shift: Total I/O In: -  Out: 200 [Urine:200]  Constitutional: NAD; conversant; no deformities Eyes: Moist conjunctiva; no lid lag; anicteric; PERRL Neck: Trachea midline; no thyromegaly Lungs: Normal respiratory effort; no tactile fremitus CV: RRR; no palpable thrills; no pitting edema GI: Abd Soft, non-tender, non-distended; no palpable hepatosplenomegaly Rectal exam - deferred till EUA later today MSK: Normal range of motion of extremities; no clubbing/cyanosis Psychiatric: Appropriate affect; alert and oriented x3 Lymphatic: No palpable cervical or axillary lymphadenopathy  Lab Results: CBC  Recent Labs    02/28/20 1543 02/29/20 0342  WBC 9.2 10.6*  HGB 15.2 13.9  HCT 43.8 40.8  PLT 142* 142*   BMET Recent Labs    02/28/20 1543 02/29/20 0342  NA 135 136  K 3.7 3.7  CL 100 101  CO2 25 26  GLUCOSE 136* 121*  BUN 15 14  CREATININE 0.84 0.83  CALCIUM 9.2 8.4*   PT/INR No results for input(s): LABPROT, INR in the last 72 hours. ABG No results for input(s): PHART, HCO3 in the last 72 hours.  Invalid input(s): PCO2, PO2  Anti-infectives: Anti-infectives (From admission, onward)   Start     Dose/Rate Route Frequency Ordered Stop   02/29/20 0200  piperacillin-tazobactam (ZOSYN) IVPB 3.375 g        3.375 g 12.5 mL/hr over 240 Minutes Intravenous Every  8 hours 02/28/20 2200     02/28/20 1745  piperacillin-tazobactam (ZOSYN) IVPB 3.375 g        3.375 g 100 mL/hr over 30 Minutes Intravenous  Once 02/28/20 1740 02/28/20 1908      Medications: Scheduled Meds: . Chlorhexidine Gluconate Cloth  6 each Topical Daily  . pantoprazole (PROTONIX) IV  40 mg Intravenous Q24H   Continuous Infusions: . sodium chloride 75 mL/hr at 02/28/20 2230  . piperacillin-tazobactam (ZOSYN)  IV 3.375 g (02/29/20 0143)   PRN Meds:.acetaminophen, fentaNYL (SUBLIMAZE) injection, ondansetron **OR** ondansetron (ZOFRAN) IV  Assessment/Plan: Mr. Mustin is an 80 year old male with rectal pain and CT concerning for proctitis vs. Stercoral perforation vs. Perianal/perirectal abscess.  I recommended exam under anesthesia with possible drainage of perirectal abscess. The procedure itself as well as its risks, benefits and alternatives were discussed and the patient granted consent to proceed.  We will proceed to the OR as soon as time is available.   LOS: 1 day   FEN: NPO ID: Zosyn VTE: SCDs, Lovenox Foley: None Dispo: Continued care on medicine service, OR today.    Felicie Morn, MD  [facility] Use [email or web address] to contact on call provider

## 2020-02-29 NOTE — Anesthesia Preprocedure Evaluation (Addendum)
Anesthesia Evaluation  Patient identified by MRN, date of birth, ID band Patient awake    Reviewed: Allergy & Precautions, NPO status , Patient's Chart, lab work & pertinent test results  History of Anesthesia Complications Negative for: history of anesthetic complications  Airway Mallampati: II  TM Distance: >3 FB Neck ROM: Full    Dental no notable dental hx. (+) Dental Advisory Given   Pulmonary neg pulmonary ROS, former smoker,    Pulmonary exam normal        Cardiovascular hypertension, Normal cardiovascular exam+ dysrhythmias Atrial Fibrillation   IMPRESSIONS    1. Left ventricular ejection fraction, by estimation, is 55 to 60%. The  left ventricle has normal function. The left ventricle has no regional  wall motion abnormalities. Left ventricular diastolic parameters are  consistent with Grade I diastolic  dysfunction (impaired relaxation).  2. Right ventricular systolic function is normal. The right ventricular  size is normal.  3. The mitral valve is grossly normal. No evidence of mitral valve  regurgitation.  4. The aortic valve was not well visualized. Aortic valve regurgitation  is not visualized. No aortic stenosis is present.  5. The inferior vena cava is normal in size with greater than 50%  respiratory variability, suggesting right atrial pressure of 3 mmHg.    Neuro/Psych PSYCHIATRIC DISORDERS Depression negative neurological ROS     GI/Hepatic Neg liver ROS, GERD  ,  Endo/Other  negative endocrine ROS  Renal/GU negative Renal ROS     Musculoskeletal negative musculoskeletal ROS (+)   Abdominal   Peds  Hematology negative hematology ROS (+)   Anesthesia Other Findings   Reproductive/Obstetrics                            Anesthesia Physical Anesthesia Plan  ASA: III and emergent  Anesthesia Plan: General   Post-op Pain Management:    Induction:  Intravenous  PONV Risk Score and Plan: 3 and Ondansetron, Dexamethasone and Diphenhydramine  Airway Management Planned: Oral ETT  Additional Equipment:   Intra-op Plan:   Post-operative Plan: Extubation in OR  Informed Consent: I have reviewed the patients History and Physical, chart, labs and discussed the procedure including the risks, benefits and alternatives for the proposed anesthesia with the patient or authorized representative who has indicated his/her understanding and acceptance.     Dental advisory given  Plan Discussed with: Anesthesiologist and CRNA  Anesthesia Plan Comments:       Anesthesia Quick Evaluation

## 2020-03-01 DIAGNOSIS — S3660XA Unspecified injury of rectum, initial encounter: Secondary | ICD-10-CM

## 2020-03-01 DIAGNOSIS — E785 Hyperlipidemia, unspecified: Secondary | ICD-10-CM

## 2020-03-01 LAB — CBC
HCT: 39.9 % (ref 39.0–52.0)
Hemoglobin: 13.4 g/dL (ref 13.0–17.0)
MCH: 32 pg (ref 26.0–34.0)
MCHC: 33.6 g/dL (ref 30.0–36.0)
MCV: 95.2 fL (ref 80.0–100.0)
Platelets: 133 10*3/uL — ABNORMAL LOW (ref 150–400)
RBC: 4.19 MIL/uL — ABNORMAL LOW (ref 4.22–5.81)
RDW: 14.3 % (ref 11.5–15.5)
WBC: 7.7 10*3/uL (ref 4.0–10.5)
nRBC: 0 % (ref 0.0–0.2)

## 2020-03-01 LAB — BASIC METABOLIC PANEL
Anion gap: 8 (ref 5–15)
BUN: 11 mg/dL (ref 8–23)
CO2: 27 mmol/L (ref 22–32)
Calcium: 8.5 mg/dL — ABNORMAL LOW (ref 8.9–10.3)
Chloride: 104 mmol/L (ref 98–111)
Creatinine, Ser: 0.8 mg/dL (ref 0.61–1.24)
GFR, Estimated: >60 mL/min (ref >60)
Glucose, Bld: 134 mg/dL — ABNORMAL HIGH (ref 70–99)
Potassium: 4.1 mmol/L (ref 3.5–5.1)
Sodium: 139 mmol/L (ref 135–145)

## 2020-03-01 MED ORDER — POLYETHYLENE GLYCOL 3350 17 G PO PACK: 17.0000 g | PACK | Freq: Every day | ORAL | 0 refills | Status: AC | PRN

## 2020-03-01 NOTE — Progress Notes (Signed)
1 Day Post-Op   Subjective/Chief Complaint: Minimal soreness Had a large normal bowel movement yesterday Afebrile   Objective: Vital signs in last 24 hours: Temp:  [97.4 F (36.3 C)-98.8 F (37.1 C)] 97.4 F (36.3 C) (02/26 0422) Pulse Rate:  [69-84] 69 (02/26 0422) Resp:  [14-30] 18 (02/26 0422) BP: (111-136)/(62-75) 128/62 (02/26 0422) SpO2:  [95 %-100 %] 100 % (02/26 0422) Last BM Date: 02/29/20  Intake/Output from previous day: 02/25 0701 - 02/26 0700 In: 1606.2 [I.V.:1492.2; IV Piggyback:114] Out: 6333 [Urine:3350; Blood:5] Intake/Output this shift: No intake/output data recorded.  WDWN in NAD  No abdominal pain - minimal perianal soreness Rectal exam deferred  Lab Results:  Recent Labs    02/29/20 0342 03/01/20 0305  WBC 10.6* 7.7  HGB 13.9 13.4  HCT 40.8 39.9  PLT 142* 133*   BMET Recent Labs    02/29/20 0342 03/01/20 0305  NA 136 139  K 3.7 4.1  CL 101 104  CO2 26 27  GLUCOSE 121* 134*  BUN 14 11  CREATININE 0.83 0.80  CALCIUM 8.4* 8.5*   PT/INR No results for input(s): LABPROT, INR in the last 72 hours. ABG No results for input(s): PHART, HCO3 in the last 72 hours.  Invalid input(s): PCO2, PO2  Studies/Results: CT ABDOMEN PELVIS W CONTRAST  Result Date: 02/28/2020 CLINICAL DATA:  Constipation and abdominal pain EXAM: CT ABDOMEN AND PELVIS WITH CONTRAST TECHNIQUE: Multidetector CT imaging of the abdomen and pelvis was performed using the standard protocol following bolus administration of intravenous contrast. CONTRAST:  163mL OMNIPAQUE IOHEXOL 300 MG/ML  SOLN COMPARISON:  None. FINDINGS: Lower chest: Lung bases demonstrate no acute consolidation or effusion. Normal cardiac size. Hepatobiliary: Numerous subcentimeter hypodense liver lesions too small to further characterize. No calcified gallstone or biliary dilatation Pancreas: Unremarkable. No pancreatic ductal dilatation or surrounding inflammatory changes. Spleen: Normal in size without  focal abnormality. Adrenals/Urinary Tract: Adrenal glands are within normal limits. Kidneys show no hydronephrosis. The bladder is unremarkable. Stomach/Bowel: The stomach is nonenlarged. Fluid-filled nondistended small bowel within the mid to low abdomen. Moderate to large volume of feces throughout the colon. Nonvisualized appendix. Moderate retained feces at the rectum. Extraluminal perirectal gas, series 2, image number 71 with small amount of presacral fluid. Indistinct appearing anal rectal region with mild thickening. Gas within the subcutaneous soft tissues of the right medial gluteal region and in the right greater than left posterior anal rectal region. Vascular/Lymphatic: Nonaneurysmal aorta.  No suspicious nodes. Reproductive: Prostate is unremarkable. Other: Small amount of presacral soft tissue stranding and gas. Musculoskeletal: Degenerative changes of the spine. No acute osseous abnormality. IMPRESSION: 1. Moderate to large volume of feces throughout the colon with moderate retained feces at the rectum. Extraluminal gas surrounding the rectum and extending through the anal sphincter complex consistent with a perforation. No drainable perianal abscess is seen. There is mild anorectal edema. Differential considerations could include trauma, perforated anal fistula, or possible stercoral proctitis complicated by perforation. 2. Fluid-filled nondistended small bowel within the mid to low abdomen, could be secondary to mild ileus. Critical Value/emergent results were called by telephone at the time of interpretation on 02/28/2020 at 5:12 pm to provider Dr. Rex Kras, who verbally acknowledged these results. Electronically Signed   By: Donavan Foil M.D.   On: 02/28/2020 17:12    Anti-infectives: Anti-infectives (From admission, onward)   Start     Dose/Rate Route Frequency Ordered Stop   02/29/20 0200  piperacillin-tazobactam (ZOSYN) IVPB 3.375 g  3.375 g 12.5 mL/hr over 240 Minutes Intravenous  Every 8 hours 02/28/20 2200     02/28/20 1745  piperacillin-tazobactam (ZOSYN) IVPB 3.375 g        3.375 g 100 mL/hr over 30 Minutes Intravenous  Once 02/28/20 1740 02/28/20 1908      Assessment/Plan: Perirectal contusion - likely secondary to enema  OK for discharge No follow-up needed Oral laxatives (Miralax) if needed for constipation   LOS: 2 days    Jose Fuentes 03/01/2020

## 2020-03-01 NOTE — Discharge Summary (Signed)
Physician Discharge Summary  Jose Fuentes QIW:[id number] DOB: 03/11/1940 DOA: 02/28/2020  PCP: Harlan Stains, MD  Admit date: 02/28/2020 Discharge date: 03/01/2020  Admitted From: Home Disposition:  Home  Recommendations for Outpatient Follow-up:  1. Follow up with PCP in 1 week  Discharge Condition: Stable CODE STATUS: Full  Diet recommendation: Heart healthy diet   Brief/Interim Summary: Jose Fuentes a 80 y.o.malewith medical history significant fordepression, hypertension, and paroxysmal atrial fibrillation not anticoagulated, now presenting to the emergency department for evaluation of rectal pain and constipation. The patient reports several months of constipation for which he has been taking MiraLAX at home, last had a bowel movement 2 days ago that required quite a bit of straining, and then attempted an enema at home but reports that the fluid came right back out, he developed severe pain, and passed some bright red blood which prompted his presentation to the ED. He denies any recent fevers, chills, chest pain, vomiting, cough, or shortness of breath. CT of the abdomen and pelvis is notable for moderate to large volume of feces throughout the colon, moderate retained feces at the rectum, and extraluminal gas surrounding the rectum concerning for perforation without a drainable abscess. Surgery was consulted. Patient underwent direct visualization under anesthesia; "normal appearance other than some bruising around the posterior perianal skin. There were no areas of induration or fluctuance. A regional block was injected with Exparel and Marcaine for pain control." His diet was advanced and he had a bowel movement prior to discharge home.   Discharge Diagnoses:  Principal Problem:   Rectal trauma Active Problems:   AF (paroxysmal atrial fibrillation) (HCC)   Depression   GERD (gastroesophageal reflux disease)   HLD (hyperlipidemia)   Peri-rectal Trauma -CT  abdomen pelvis notable for moderate to large volume of feces throughout the colon, moderate retained feces at the rectum, and extraluminal gas surrounding the rectum concerning for perforation without a drainable abscess -General surgery consulted, underwent direct visualization under anesthesia "normal appearance other than some bruising around the posterior perianal skin. There were no areas of induration or fluctuance. A regional block was injected with Exparel and Marcaine for pain control."  Paroxysmal atrial fibrillation -Currently not on anticoagulation as an outpatient due to falls and gait instability  Depression -Continue Wellbutrin, Pristiq  Hyperlipidemia -Continue atorvastatin  GERD -Continue Protonix   Discharge Instructions  Discharge Instructions    Call MD for:  difficulty breathing, headache or visual disturbances   Complete by: As directed    Call MD for:  extreme fatigue   Complete by: As directed    Call MD for:  persistant dizziness or light-headedness   Complete by: As directed    Call MD for:  persistant nausea and vomiting   Complete by: As directed    Call MD for:  severe uncontrolled pain   Complete by: As directed    Call MD for:  temperature >100.4   Complete by: As directed    Discharge instructions   Complete by: As directed    You were cared for by a hospitalist during your hospital stay. If you have any questions about your discharge medications or the care you received while you were in the hospital after you are discharged, you can call the unit and ask to speak with the hospitalist on call if the hospitalist that took care of you is not available. Once you are discharged, your primary care physician will handle any further medical issues. Please note that NO REFILLS  for any discharge medications will be authorized once you are discharged, as it is imperative that you return to your primary care physician (or establish a relationship with a  primary care physician if you do not have one) for your aftercare needs so that they can reassess your need for medications and monitor your lab values.   Increase activity slowly   Complete by: As directed      Allergies as of 03/01/2020      Reactions   Azithromycin Diarrhea   Escitalopram Oxalate Other (See Comments)   Worsening mood swings   Fluoxetine Hcl Other (See Comments)   Worsening mood swings      Medication List    TAKE these medications   aspirin EC 81 MG tablet Take 1 tablet (81 mg total) by mouth daily. Swallow whole.   atorvastatin 40 MG tablet Commonly known as: LIPITOR Take 40 mg by mouth daily with supper.   buPROPion 300 MG 24 hr tablet Commonly known as: WELLBUTRIN XL Take 300 mg by mouth daily before supper.   Desvenlafaxine Succinate ER 25 MG Tb24 Take 75 mg by mouth daily.   folic acid 1 MG tablet Commonly known as: FOLVITE Take 1 tablet (1 mg total) by mouth daily.   GLUCOSAMINE-CHONDROITIN PO Take 2 tablets by mouth daily with supper.   multivitamin with minerals Tabs tablet Take 1 tablet by mouth daily.   pantoprazole 40 MG tablet Commonly known as: PROTONIX Take 40 mg by mouth daily before supper.   polyethylene glycol 17 g packet Commonly known as: MIRALAX / GLYCOLAX Take 17 g by mouth daily as needed for mild constipation.   thiamine 100 MG tablet Take 1 tablet (100 mg total) by mouth daily.   vitamin B-12 1000 MCG tablet Commonly known as: CYANOCOBALAMIN Take 1,000 mcg by mouth daily with supper.   Vitamin D-3 25 MCG (1000 UT) Caps Take 1,000 Units by mouth daily with supper.       Follow-up Information    Harlan Stains, MD Follow up.   Specialty: Family Medicine Contact information: [place] [address] [phone]              Allergies  Allergen Reactions  . Azithromycin Diarrhea  . Escitalopram Oxalate Other (See Comments)    Worsening mood swings  . Fluoxetine Hcl Other  (See Comments)    Worsening mood swings    Consultations:  General surgery    Procedures/Studies: CT Angio Head W or Wo Contrast  Result Date: 02/14/2020 CLINICAL DATA:  TIA. New onset gait instability, intermittent dysarthria, and mild confusion. EXAM: CT ANGIOGRAPHY HEAD AND NECK TECHNIQUE: Multidetector CT imaging of the head and neck was performed using the standard protocol during bolus administration of intravenous contrast. Multiplanar CT image reconstructions and MIPs were obtained to evaluate the vascular anatomy. Carotid stenosis measurements (when applicable) are obtained utilizing NASCET criteria, using the distal internal carotid diameter as the denominator. CONTRAST:  60mL OMNIPAQUE IOHEXOL 300 MG/ML  SOLN COMPARISON:  None. FINDINGS: CTA NECK FINDINGS Aortic arch: Standard 3 vessel aortic arch with mild atherosclerotic plaque. Widely patent arch vessel origins. Right carotid system: Patent without evidence of stenosis, dissection, or significant atherosclerosis. Left carotid system: Patent with minimal calcified plaque at the carotid bifurcation. No evidence of stenosis or dissection. Vertebral arteries: Patent without evidence of stenosis, dissection, or significant atherosclerosis. Codominant. Skeleton: Advanced disc degeneration from C3-4 to C6-7. Advanced diffuse cervical facet arthrosis. Other neck: No evidence of  cervical lymphadenopathy. 6 mm well-defined hypodensity in the anteroinferior aspect of the left submandibular gland, too small to fully characterize though with its CT appearance as well as T1 hypointensity on MRI potentially indicative of a small cyst. Upper chest: Mild scarring in the lung apices. Review of the MIP images confirms the above findings CTA HEAD FINDINGS Anterior circulation: The internal carotid arteries are widely patent from skull base to carotid termini. ACAs and MCAs are patent without evidence of a proximal branch occlusion or significant proximal  stenosis. No aneurysm is identified. Posterior circulation: The intracranial vertebral arteries are widely patent to the basilar. Patent bilateral PICA, right AICA, and bilateral SCA origins are identified. The left AICA is small with its origin poorly evaluated. The basilar artery is patent and mildly small in caliber diffusely on a congenital basis without evidence of a significant focal stenosis. There is a fetal origin of the left PCA. Both PCAs are patent without evidence of a significant proximal stenosis. No aneurysm is identified. Venous sinuses: Patent. Anatomic variants: Fetal left PCA. Review of the MIP images confirms the above findings IMPRESSION: 1. Minimal atherosclerosis without large vessel occlusion or significant stenosis in the head or neck. 2. Aortic Atherosclerosis (ICD10-I70.0). Electronically Signed   By: Logan Bores M.D.   On: 02/14/2020 12:10   DG Chest 2 View  Result Date: 02/14/2020 CLINICAL DATA:  Speech difficulty. EXAM: CHEST - 2 VIEW COMPARISON:  None. FINDINGS: The heart size and mediastinal contours are within normal limits. Both lungs are clear. The visualized skeletal structures are unremarkable. IMPRESSION: No active cardiopulmonary disease. Electronically Signed   By: Marijo Conception M.D.   On: 02/14/2020 09:56   CT HEAD WO CONTRAST  Result Date: 02/14/2020 CLINICAL DATA:  80 year old male with weakness and abnormal speech upon waking. EXAM: CT HEAD WITHOUT CONTRAST TECHNIQUE: Contiguous axial images were obtained from the base of the skull through the vertex without intravenous contrast. COMPARISON:  Brain MRI 02/15/2015.  Head CT 06/21/2018. FINDINGS: Brain: Cerebral volume is stable and within normal limits for age. No midline shift, ventriculomegaly, mass effect, evidence of mass lesion, intracranial hemorrhage or evidence of cortically based acute infarction. Patchy fairly mild for age white matter hypodensity in both hemispheres appears stable since 2020.  Vascular: Mild Calcified atherosclerosis at the skull base. No suspicious intracranial vascular hyperdensity. Skull: No acute osseous abnormality identified. Sinuses/Orbits: Visualized paranasal sinuses and mastoids are clear. Other: Stable orbit and scalp soft tissues. IMPRESSION: Stable since 2020 with mild for age cerebral white matter changes. Electronically Signed   By: Genevie Ann M.D.   On: 02/14/2020 07:50   CT Angio Neck W and/or Wo Contrast  Result Date: 02/14/2020 CLINICAL DATA:  TIA. New onset gait instability, intermittent dysarthria, and mild confusion. EXAM: CT ANGIOGRAPHY HEAD AND NECK TECHNIQUE: Multidetector CT imaging of the head and neck was performed using the standard protocol during bolus administration of intravenous contrast. Multiplanar CT image reconstructions and MIPs were obtained to evaluate the vascular anatomy. Carotid stenosis measurements (when applicable) are obtained utilizing NASCET criteria, using the distal internal carotid diameter as the denominator. CONTRAST:  11mL OMNIPAQUE IOHEXOL 300 MG/ML  SOLN COMPARISON:  None. FINDINGS: CTA NECK FINDINGS Aortic arch: Standard 3 vessel aortic arch with mild atherosclerotic plaque. Widely patent arch vessel origins. Right carotid system: Patent without evidence of stenosis, dissection, or significant atherosclerosis. Left carotid system: Patent with minimal calcified plaque at the carotid bifurcation. No evidence of stenosis or dissection. Vertebral arteries: Patent without  evidence of stenosis, dissection, or significant atherosclerosis. Codominant. Skeleton: Advanced disc degeneration from C3-4 to C6-7. Advanced diffuse cervical facet arthrosis. Other neck: No evidence of cervical lymphadenopathy. 6 mm well-defined hypodensity in the anteroinferior aspect of the left submandibular gland, too small to fully characterize though with its CT appearance as well as T1 hypointensity on MRI potentially indicative of a small cyst. Upper chest:  Mild scarring in the lung apices. Review of the MIP images confirms the above findings CTA HEAD FINDINGS Anterior circulation: The internal carotid arteries are widely patent from skull base to carotid termini. ACAs and MCAs are patent without evidence of a proximal branch occlusion or significant proximal stenosis. No aneurysm is identified. Posterior circulation: The intracranial vertebral arteries are widely patent to the basilar. Patent bilateral PICA, right AICA, and bilateral SCA origins are identified. The left AICA is small with its origin poorly evaluated. The basilar artery is patent and mildly small in caliber diffusely on a congenital basis without evidence of a significant focal stenosis. There is a fetal origin of the left PCA. Both PCAs are patent without evidence of a significant proximal stenosis. No aneurysm is identified. Venous sinuses: Patent. Anatomic variants: Fetal left PCA. Review of the MIP images confirms the above findings IMPRESSION: 1. Minimal atherosclerosis without large vessel occlusion or significant stenosis in the head or neck. 2. Aortic Atherosclerosis (ICD10-I70.0). Electronically Signed   By: Logan Bores M.D.   On: 02/14/2020 12:10   MR BRAIN WO CONTRAST  Result Date: 02/14/2020 CLINICAL DATA:  TIA.  Slurred speech and difficulty walking. EXAM: MRI HEAD WITHOUT CONTRAST TECHNIQUE: Multiplanar, multiecho pulse sequences of the brain and surrounding structures were obtained without intravenous contrast. COMPARISON:  MRI December 15, 2015.  CT head from the same day. FINDINGS: Brain: No acute infarction, hemorrhage, hydrocephalus, extra-axial collection or mass lesion. Similar tiny remote right pontine infarct versus dilated perivascular space. Similar scattered T2/FLAIR hyperintensities within the white matter, most likely related to chronic microvascular ischemic disease. Similar generalized cerebral volume loss with ex vacuo ventricular dilation. Vascular: Major arterial  flow voids are maintained at the skull base. Skull and upper cervical spine: Normal marrow signal. Progression of degenerative pannus along the dorsal dens. Sinuses/Orbits: Mild paranasal sinus mucosal thickening without air-fluid levels. Unremarkable orbits. Other: No mastoid effusions. IMPRESSION: 1. No acute intracranial abnormality.  No acute infarct. 2. Similar chronic findings, including tiny remote right pontine infarct versus dilated perivascular space and chronic microvascular ischemic disease. Electronically Signed   By: Margaretha Sheffield MD   On: 02/14/2020 10:33   CT ABDOMEN PELVIS W CONTRAST  Result Date: 02/28/2020 CLINICAL DATA:  Constipation and abdominal pain EXAM: CT ABDOMEN AND PELVIS WITH CONTRAST TECHNIQUE: Multidetector CT imaging of the abdomen and pelvis was performed using the standard protocol following bolus administration of intravenous contrast. CONTRAST:  140mL OMNIPAQUE IOHEXOL 300 MG/ML  SOLN COMPARISON:  None. FINDINGS: Lower chest: Lung bases demonstrate no acute consolidation or effusion. Normal cardiac size. Hepatobiliary: Numerous subcentimeter hypodense liver lesions too small to further characterize. No calcified gallstone or biliary dilatation Pancreas: Unremarkable. No pancreatic ductal dilatation or surrounding inflammatory changes. Spleen: Normal in size without focal abnormality. Adrenals/Urinary Tract: Adrenal glands are within normal limits. Kidneys show no hydronephrosis. The bladder is unremarkable. Stomach/Bowel: The stomach is nonenlarged. Fluid-filled nondistended small bowel within the mid to low abdomen. Moderate to large volume of feces throughout the colon. Nonvisualized appendix. Moderate retained feces at the rectum. Extraluminal perirectal gas, series 2, image number 71  with small amount of presacral fluid. Indistinct appearing anal rectal region with mild thickening. Gas within the subcutaneous soft tissues of the right medial gluteal region and in the  right greater than left posterior anal rectal region. Vascular/Lymphatic: Nonaneurysmal aorta.  No suspicious nodes. Reproductive: Prostate is unremarkable. Other: Small amount of presacral soft tissue stranding and gas. Musculoskeletal: Degenerative changes of the spine. No acute osseous abnormality. IMPRESSION: 1. Moderate to large volume of feces throughout the colon with moderate retained feces at the rectum. Extraluminal gas surrounding the rectum and extending through the anal sphincter complex consistent with a perforation. No drainable perianal abscess is seen. There is mild anorectal edema. Differential considerations could include trauma, perforated anal fistula, or possible stercoral proctitis complicated by perforation. 2. Fluid-filled nondistended small bowel within the mid to low abdomen, could be secondary to mild ileus. Critical Value/emergent results were called by telephone at the time of interpretation on 02/28/2020 at 5:12 pm to provider Dr. Rex Kras, who verbally acknowledged these results. Electronically Signed   By: Donavan Foil M.D.   On: 02/28/2020 17:12   ECHOCARDIOGRAM COMPLETE  Result Date: 02/15/2020    ECHOCARDIOGRAM REPORT   Patient Name:   TAMAJ JURGENS [facility] Date of Exam: 02/15/2020 Medical Rec #:  [id number]           Height:       66.0 in Accession #:    [id number]          Weight:       132.0 lb Date of Birth:  10-Jun-1940            BSA:          1.676 m Patient Age:    56 years            BP:           134/82 mmHg Patient Gender: M                   HR:           78 bpm. Exam Location:  Inpatient Procedure: 2D Echo Indications:    Atrial Fibrillation I48.91  History:        Patient has no prior history of Echocardiogram examinations.                 Arrythmias:Atrial Fibrillation; Risk Factors:Hypertension and                 Dyslipidemia.  Sonographer:    Mikki Santee RDCS (AE) Referring Phys: [id number] RONDELL A SMITH IMPRESSIONS  1. Left ventricular ejection fraction, by  estimation, is 55 to 60%. The left ventricle has normal function. The left ventricle has no regional wall motion abnormalities. Left ventricular diastolic parameters are consistent with Grade I diastolic dysfunction (impaired relaxation).  2. Right ventricular systolic function is normal. The right ventricular size is normal.  3. The mitral valve is grossly normal. No evidence of mitral valve regurgitation.  4. The aortic valve was not well visualized. Aortic valve regurgitation is not visualized. No aortic stenosis is present.  5. The inferior vena cava is normal in size with greater than 50% respiratory variability, suggesting right atrial pressure of 3 mmHg. Comparison(s): No prior Echocardiogram. FINDINGS  Left Ventricle: Left ventricular ejection fraction, by estimation, is 55 to 60%. The left ventricle has normal function. The left ventricle has no regional wall motion abnormalities. The left ventricular internal cavity size was normal in size. There is  no left ventricular hypertrophy.  Left ventricular diastolic parameters are consistent with Grade I diastolic dysfunction (impaired relaxation). Normal left ventricular filling pressure. Right Ventricle: The right ventricular size is normal. No increase in right ventricular wall thickness. Right ventricular systolic function is normal. Left Atrium: Left atrial size was normal in size. Right Atrium: Right atrial size was normal in size. Pericardium: There is no evidence of pericardial effusion. Mitral Valve: The mitral valve is grossly normal. No evidence of mitral valve regurgitation. Tricuspid Valve: The tricuspid valve is grossly normal. Tricuspid valve regurgitation is trivial. Aortic Valve: The aortic valve was not well visualized. Aortic valve regurgitation is not visualized. No aortic stenosis is present. Pulmonic Valve: The pulmonic valve was normal in structure. Pulmonic valve regurgitation is not visualized. Aorta: The aortic root is normal in size and  structure. Venous: The inferior vena cava is normal in size with greater than 50% respiratory variability, suggesting right atrial pressure of 3 mmHg. IAS/Shunts: No atrial level shunt detected by color flow Doppler.  LEFT VENTRICLE PLAX 2D LVIDd:         4.10 cm  Diastology LVIDs:         2.90 cm  LV e' medial:    7.51 cm/s LV PW:         1.00 cm  LV E/e' medial:  9.7 LV IVS:        1.00 cm  LV e' lateral:   10.60 cm/s LVOT diam:     2.20 cm  LV E/e' lateral: 6.9 LV SV:         70 LV SV Index:   42 LVOT Area:     3.80 cm  RIGHT VENTRICLE RV S prime:     15.00 cm/s TAPSE (M-mode): 2.0 cm LEFT ATRIUM             Index       RIGHT ATRIUM           Index LA diam:        3.20 cm 1.91 cm/m  RA Area:     13.10 cm LA Vol (A2C):   25.7 ml 15.33 ml/m RA Volume:   26.90 ml  16.05 ml/m LA Vol (A4C):   40.8 ml 24.34 ml/m LA Biplane Vol: 35.5 ml 21.18 ml/m  AORTIC VALVE LVOT Vmax:   79.50 cm/s LVOT Vmean:  55.000 cm/s LVOT VTI:    0.185 m  AORTA Ao Root diam: 3.30 cm MITRAL VALVE MV Area (PHT): 3.77 cm    SHUNTS MV Decel Time: 201 msec    Systemic VTI:  0.18 m MV E velocity: 72.80 cm/s  Systemic Diam: 2.20 cm MV A velocity: 88.30 cm/s MV E/A ratio:  0.82 Lyman Bishop MD Electronically signed by Lyman Bishop MD Signature Date/Time: 02/15/2020/11:25:27 AM    Final       Discharge Exam: Vitals:   02/29/20 2001 03/01/20 0422  BP: 111/63 128/62  Pulse: 83 69  Resp: 18 18  Temp: 98.3 F (36.8 C) (!) 97.4 F (36.3 C)  SpO2: 97% 100%    General: Pt is alert, awake, not in acute distress Cardiovascular: RRR, S1/S2 +, no edema Respiratory: CTA bilaterally, no wheezing, no rhonchi, no respiratory distress, no conversational dyspnea  Abdominal: Soft, NT, ND, bowel sounds + Extremities: no edema, no cyanosis Psych: Normal mood and affect, stable judgement and insight     The results of significant diagnostics from this hospitalization (including imaging, microbiology, ancillary and laboratory) are listed  below for reference.  Microbiology: Recent Results (from the past 240 hour(s))  Resp Panel by RT-PCR (Flu A&B, Covid) Nasopharyngeal Swab     Status: None   Collection Time: 02/28/20  6:04 PM   Specimen: Nasopharyngeal Swab; Nasopharyngeal(NP) swabs in vial transport medium  Result Value Ref Range Status   SARS Coronavirus 2 by RT PCR NEGATIVE NEGATIVE Final    Comment: (NOTE) SARS-CoV-2 target nucleic acids are NOT DETECTED.  The SARS-CoV-2 RNA is generally detectable in upper respiratory specimens during the acute phase of infection. The lowest concentration of SARS-CoV-2 viral copies this assay can detect is 138 copies/mL. A negative result does not preclude SARS-Cov-2 infection and should not be used as the sole basis for treatment or other patient management decisions. A negative result may occur with  improper specimen collection/handling, submission of specimen other than nasopharyngeal swab, presence of viral mutation(s) within the areas targeted by this assay, and inadequate number of viral copies(<138 copies/mL). A negative result must be combined with clinical observations, patient history, and epidemiological information. The expected result is Negative.  Fact Sheet for Patients:  EntrepreneurPulse.com.au  Fact Sheet for Healthcare Providers:  IncredibleEmployment.be  This test is no t yet approved or cleared by the [place] FDA and  has been authorized for detection and/or diagnosis of SARS-CoV-2 by FDA under an Emergency Use Authorization (EUA). This EUA will remain  in effect (meaning this test can be used) for the duration of the COVID-19 declaration under Section 564(b)(1) of the Act, 21 U.S.C.section 360bbb-3(b)(1), unless the authorization is terminated  or revoked sooner.       Influenza A by PCR NEGATIVE NEGATIVE Final   Influenza B by PCR NEGATIVE NEGATIVE Final    Comment: (NOTE) The Xpert Xpress  SARS-CoV-2/FLU/RSV plus assay is intended as an aid in the diagnosis of influenza from Nasopharyngeal swab specimens and should not be used as a sole basis for treatment. Nasal washings and aspirates are unacceptable for Xpert Xpress SARS-CoV-2/FLU/RSV testing.  Fact Sheet for Patients: EntrepreneurPulse.com.au  Fact Sheet for Healthcare Providers: IncredibleEmployment.be  This test is not yet approved or cleared by the [place] FDA and has been authorized for detection and/or diagnosis of SARS-CoV-2 by FDA under an Emergency Use Authorization (EUA). This EUA will remain in effect (meaning this test can be used) for the duration of the COVID-19 declaration under Section 564(b)(1) of the Act, 21 U.S.C. section 360bbb-3(b)(1), unless the authorization is terminated or revoked.  Performed at [facility], [address] [place], [place] [address]      Labs: BNP (last 3 results) No results for input(s): BNP in the last 8760 hours. Basic Metabolic Panel: Recent Labs  Lab 02/28/20 1543 02/29/20 0342 03/01/20 0305  NA 135 136 139  K 3.7 3.7 4.1  CL 100 101 104  CO2 25 26 27   GLUCOSE 136* 121* 134*  BUN 15 14 11   CREATININE 0.84 0.83 0.80  CALCIUM 9.2 8.4* 8.5*   Liver Function Tests: No results for input(s): AST, ALT, ALKPHOS, BILITOT, PROT, ALBUMIN in the last 168 hours. Recent Labs  Lab 02/28/20 1543  LIPASE 37   No results for input(s): AMMONIA in the last 168 hours. CBC: Recent Labs  Lab 02/28/20 1543 02/29/20 0342 03/01/20 0305  WBC 9.2 10.6* 7.7  NEUTROABS 8.0*  --   --   HGB 15.2 13.9 13.4  HCT 43.8 40.8 39.9  MCV 93.4 93.6 95.2  PLT 142* 142* 133*   Cardiac Enzymes: No results for  input(s): CKTOTAL, CKMB, CKMBINDEX, TROPONINI in the last 168 hours. BNP: Invalid input(s): POCBNP CBG: No results for input(s): GLUCAP in the last 168 hours. D-Dimer No results for input(s): DDIMER in the last 72  hours. Hgb A1c No results for input(s): HGBA1C in the last 72 hours. Lipid Profile No results for input(s): CHOL, HDL, LDLCALC, TRIG, CHOLHDL, LDLDIRECT in the last 72 hours. Thyroid function studies No results for input(s): TSH, T4TOTAL, T3FREE, THYROIDAB in the last 72 hours.  Invalid input(s): FREET3 Anemia work up No results for input(s): VITAMINB12, FOLATE, FERRITIN, TIBC, IRON, RETICCTPCT in the last 72 hours. Urinalysis    Component Value Date/Time   COLORURINE YELLOW 02/14/2020 0754   APPEARANCEUR CLEAR 02/14/2020 0754   LABSPEC 1.015 02/14/2020 0754   PHURINE 6.0 02/14/2020 0754   GLUCOSEU NEGATIVE 02/14/2020 0754   HGBUR NEGATIVE 02/14/2020 0754   BILIRUBINUR NEGATIVE 02/14/2020 0754   KETONESUR NEGATIVE 02/14/2020 0754   PROTEINUR NEGATIVE 02/14/2020 0754   UROBILINOGEN 0.2 05/06/2009 0145   NITRITE NEGATIVE 02/14/2020 0754   LEUKOCYTESUR NEGATIVE 02/14/2020 0754   Sepsis Labs Invalid input(s): PROCALCITONIN,  WBC,  LACTICIDVEN Microbiology Recent Results (from the past 240 hour(s))  Resp Panel by RT-PCR (Flu A&B, Covid) Nasopharyngeal Swab     Status: None   Collection Time: 02/28/20  6:04 PM   Specimen: Nasopharyngeal Swab; Nasopharyngeal(NP) swabs in vial transport medium  Result Value Ref Range Status   SARS Coronavirus 2 by RT PCR NEGATIVE NEGATIVE Final    Comment: (NOTE) SARS-CoV-2 target nucleic acids are NOT DETECTED.  The SARS-CoV-2 RNA is generally detectable in upper respiratory specimens during the acute phase of infection. The lowest concentration of SARS-CoV-2 viral copies this assay can detect is 138 copies/mL. A negative result does not preclude SARS-Cov-2 infection and should not be used as the sole basis for treatment or other patient management decisions. A negative result may occur with  improper specimen collection/handling, submission of specimen other than nasopharyngeal swab, presence of viral mutation(s) within the areas targeted by  this assay, and inadequate number of viral copies(<138 copies/mL). A negative result must be combined with clinical observations, patient history, and epidemiological information. The expected result is Negative.  Fact Sheet for Patients:  EntrepreneurPulse.com.au  Fact Sheet for Healthcare Providers:  IncredibleEmployment.be  This test is no t yet approved or cleared by the [place] FDA and  has been authorized for detection and/or diagnosis of SARS-CoV-2 by FDA under an Emergency Use Authorization (EUA). This EUA will remain  in effect (meaning this test can be used) for the duration of the COVID-19 declaration under Section 564(b)(1) of the Act, 21 U.S.C.section 360bbb-3(b)(1), unless the authorization is terminated  or revoked sooner.       Influenza A by PCR NEGATIVE NEGATIVE Final   Influenza B by PCR NEGATIVE NEGATIVE Final    Comment: (NOTE) The Xpert Xpress SARS-CoV-2/FLU/RSV plus assay is intended as an aid in the diagnosis of influenza from Nasopharyngeal swab specimens and should not be used as a sole basis for treatment. Nasal washings and aspirates are unacceptable for Xpert Xpress SARS-CoV-2/FLU/RSV testing.  Fact Sheet for Patients: EntrepreneurPulse.com.au  Fact Sheet for Healthcare Providers: IncredibleEmployment.be  This test is not yet approved or cleared by the [place] FDA and has been authorized for detection and/or diagnosis of SARS-CoV-2 by FDA under an Emergency Use Authorization (EUA). This EUA will remain in effect (meaning this test can be used) for the duration of the COVID-19 declaration under Section 564(b)(1) of  the Act, 21 U.S.C. section 360bbb-3(b)(1), unless the authorization is terminated or revoked.  Performed at [facility], [facility], [address] [place], [place] [address]      Patient was seen and examined on the day of discharge and was found  to be in stable condition. Time coordinating discharge: 20 minutes including assessment and coordination of care, as well as examination of the patient.   SIGNED:  Dessa Phi, DO [facility] 03/01/2020, 8:17 AM

## 2020-03-01 NOTE — Progress Notes (Signed)
Patient will be discharging with wife after 2pm today. Education will given on medications. Belongings were returned.

## 2020-03-03 NOTE — Anesthesia Postprocedure Evaluation (Signed)
Anesthesia Post Note  Patient: Jose Fuentes  Procedure(s) Performed: Jasmine December UNDER ANESTHESIA (N/A )     Patient location during evaluation: PACU Anesthesia Type: General Level of consciousness: awake and alert Pain management: pain level controlled Vital Signs Assessment: post-procedure vital signs reviewed and stable Respiratory status: spontaneous breathing, nonlabored ventilation, respiratory function stable and patient connected to nasal cannula oxygen Cardiovascular status: blood pressure returned to baseline and stable Postop Assessment: no apparent nausea or vomiting Anesthetic complications: no   No complications documented.  Last Vitals:  Vitals:   03/01/20 0422 03/01/20 1334  BP: 128/62 129/72  Pulse: 69 68  Resp: 18 18  Temp: (!) 36.3 C (!) 36.4 C  SpO2: 100% 100%    Last Pain:  Vitals:   03/01/20 1300  TempSrc:   PainSc: 0-No pain                 Raed Schalk S

## 2020-03-04 DIAGNOSIS — S3660XD Unspecified injury of rectum, subsequent encounter: Secondary | ICD-10-CM | POA: Diagnosis not present

## 2020-03-04 DIAGNOSIS — F324 Major depressive disorder, single episode, in partial remission: Secondary | ICD-10-CM | POA: Diagnosis not present

## 2020-03-04 DIAGNOSIS — K5900 Constipation, unspecified: Secondary | ICD-10-CM | POA: Diagnosis not present

## 2020-03-09 ENCOUNTER — Ambulatory Visit
Admission: RE | Admit: 2020-03-09 | Discharge: 2020-03-09 | Disposition: A | Discharge status: Home / Self Care | Payer: Medicare Other | Source: Ambulatory Visit | Attending: Family Medicine | Admitting: Family Medicine

## 2020-03-09 ENCOUNTER — Other Ambulatory Visit: Payer: Self-pay

## 2020-03-09 DIAGNOSIS — M4802 Spinal stenosis, cervical region: Secondary | ICD-10-CM | POA: Diagnosis not present

## 2020-03-09 DIAGNOSIS — M5030 Other cervical disc degeneration, unspecified cervical region: Secondary | ICD-10-CM

## 2020-03-24 DIAGNOSIS — D1801 Hemangioma of skin and subcutaneous tissue: Secondary | ICD-10-CM | POA: Diagnosis not present

## 2020-03-24 DIAGNOSIS — Z85820 Personal history of malignant melanoma of skin: Secondary | ICD-10-CM | POA: Diagnosis not present

## 2020-03-24 DIAGNOSIS — D2262 Melanocytic nevi of left upper limb, including shoulder: Secondary | ICD-10-CM | POA: Diagnosis not present

## 2020-03-24 DIAGNOSIS — D2261 Melanocytic nevi of right upper limb, including shoulder: Secondary | ICD-10-CM | POA: Diagnosis not present

## 2020-03-24 DIAGNOSIS — D225 Melanocytic nevi of trunk: Secondary | ICD-10-CM | POA: Diagnosis not present

## 2020-03-24 DIAGNOSIS — L812 Freckles: Secondary | ICD-10-CM | POA: Diagnosis not present

## 2020-03-24 DIAGNOSIS — L821 Other seborrheic keratosis: Secondary | ICD-10-CM | POA: Diagnosis not present

## 2020-03-26 ENCOUNTER — Other Ambulatory Visit: Payer: Self-pay

## 2020-03-26 ENCOUNTER — Ambulatory Visit (HOSPITAL_BASED_OUTPATIENT_CLINIC_OR_DEPARTMENT_OTHER): Payer: Medicare Other | Attending: Internal Medicine | Admitting: Internal Medicine

## 2020-03-26 VITALS — Ht 66.0 in | Wt 132.0 lb

## 2020-03-26 DIAGNOSIS — G4733 Obstructive sleep apnea (adult) (pediatric): Secondary | ICD-10-CM | POA: Insufficient documentation

## 2020-03-26 DIAGNOSIS — R0681 Apnea, not elsewhere classified: Secondary | ICD-10-CM

## 2020-04-01 DIAGNOSIS — G4733 Obstructive sleep apnea (adult) (pediatric): Secondary | ICD-10-CM | POA: Diagnosis not present

## 2020-04-01 NOTE — Procedures (Signed)
   NAME: Jose Fuentes DATE OF BIRTH:  1940/03/07 MEDICAL RECORD NUMBER [id number]  LOCATION: [facility]  PHYSICIAN: Marius Ditch  DATE OF STUDY: 03/26/2020  SLEEP STUDY TYPE: Nocturnal Polysomnogram               REFERRING PHYSICIAN: Marius Ditch, MD  EPWORTH SLEEPINESS SCORE:  6 HEIGHT: 5\' 6"  (167.6 cm)  WEIGHT: 132 lb (59.9 kg)    Body mass index is 21.31 kg/m.  NECK SIZE: 14.75 in.  CLINICAL INFORMATION The patient was referred to the sleep center for evaluation of witnessed apnea and snoring.   MEDICATIONS No sleep medicine administered.Marland Kitchen  SLEEP STUDY TECHNIQUE A multi-channel overnight Polysomnography study was performed. The channels recorded and monitored were central and occipital EEG, electrooculogram (EOG), submentalis EMG (chin), nasal and oral airflow, thoracic and abdominal wall motion, anterior tibialis EMG, snore microphone, electrocardiogram, and a pulse oximetry.  TECHNICAL COMMENTS Comments added by Technician: PATIENT WAS ORDERED AS A NPSG ONLY. Comments added by Scorer: N/A  SLEEP ARCHITECTURE The study was initiated at 9:52:10 PM and terminated at 4:39:44 AM. The total recorded time was 407.6 minutes. EEG confirmed total sleep time was 299.9 minutes yielding a sleep efficiency of 73.6%. Sleep onset after lights out was 15.7 minutes. The patient did not enter REM sleep. The patient spent 16.84% of the night in stage N1 sleep, 83.16% in stage N2 sleep, 0.00% in stage N3 and 0% in REM. Wake after sleep onset (WASO) was 92.0 minutes. The Arousal Index was 43.8/hour.  RESPIRATORY PARAMETERS There were a total of 66 respiratory disturbances out of which 8 were apneas ( 3 obstructive, 2 mixed, 3 central) and 58 hypopneas. The apnea/hypopnea index (AHI) was 13.2 events/hour. The central sleep apnea index was 0.6 events/hour. The supine AHI was 18.6 events/hour and the non supine AHI was 0 events/hour. Respiratory disturbance index was  33 events/hour overall. Respiratory disturbances were associated with oxygen desaturation down to a nadir of 86.00% during sleep. The mean oxygen saturation during the study was 94.00%. The cumulative time under 88% oxygen saturation was 0.4 minutes.  LEG MOVEMENT DATA The total leg movements were with a resulting leg movement index of 27/hr.  Associated arousal with leg movement index was 2.4/hr.  CARDIAC DATA The underlying cardiac rhythm was most consistent with sinus rhythm. Mean heart rate during sleep was 76.30 bpm. Additional rhythm abnormalities include PVCs.  IMPRESSIONS - Mild Obstructive Sleep Apnea (OSA) by AHI; Severe OSA by RDI.  - No Significant Central Sleep Apnea (CSA) - Moderate leg movements during sleep. However, no significant associated arousals. - No REM sleep was noted  DIAGNOSIS - Obstructive Sleep Apnea (G47.33)  RECOMMENDATIONS - Review findings and detail clinical situation with patient in sleep center.   Marius Ditch Sleep specialist, [place] Board of [employer] Medicine  ELECTRONICALLY SIGNED ON:  04/01/2020, 8:27 PM [place] PH: [phone]   FX: [phone] [place]

## 2020-04-07 DIAGNOSIS — G4733 Obstructive sleep apnea (adult) (pediatric): Secondary | ICD-10-CM | POA: Diagnosis not present

## 2020-04-08 DIAGNOSIS — M542 Cervicalgia: Secondary | ICD-10-CM | POA: Diagnosis not present

## 2020-04-14 ENCOUNTER — Ambulatory Visit: Payer: Medicare Other | Attending: Internal Medicine

## 2020-04-14 DIAGNOSIS — Z23 Encounter for immunization: Secondary | ICD-10-CM

## 2020-04-14 NOTE — Progress Notes (Signed)
   Covid-19 Vaccination Clinic  Name:  Jonnathan Birman    MRN: [id number] DOB: Oct 19, 1940  04/14/2020  Mr. Stock was observed post Covid-19 immunization for 15 minutes without incident. He was provided with Vaccine Information Sheet and instruction to access the V-Safe system.   Mr. Faison was instructed to call 911 with any severe reactions post vaccine: Marland Kitchen Difficulty breathing  . Swelling of face and throat  . A fast heartbeat  . A bad rash all over body  . Dizziness and weakness   Immunizations Administered    Name Date Dose VIS Date Route   PFIZER Comrnaty(Gray TOP) Covid-19 Vaccine 04/14/2020 10:12 AM 0.3 mL 12/13/2019 Intramuscular   Manufacturer: [place]   Lot: [id number]   [place]: [phone]

## 2020-04-18 ENCOUNTER — Other Ambulatory Visit (HOSPITAL_BASED_OUTPATIENT_CLINIC_OR_DEPARTMENT_OTHER): Payer: Self-pay

## 2020-04-18 MED ORDER — PFIZER-BIONT COVID-19 VAC-TRIS 30 MCG/0.3ML IM SUSP
INTRAMUSCULAR | 0 refills | Status: AC
  Filled 2020-04-18: qty 0.3, 1d supply, fill #0

## 2020-05-08 DIAGNOSIS — Z79899 Other long term (current) drug therapy: Secondary | ICD-10-CM | POA: Diagnosis not present

## 2020-05-08 DIAGNOSIS — K219 Gastro-esophageal reflux disease without esophagitis: Secondary | ICD-10-CM | POA: Diagnosis not present

## 2020-05-23 DIAGNOSIS — W01198A Fall on same level from slipping, tripping and stumbling with subsequent striking against other object, initial encounter: Secondary | ICD-10-CM | POA: Diagnosis not present

## 2020-05-23 DIAGNOSIS — S299XXA Unspecified injury of thorax, initial encounter: Secondary | ICD-10-CM | POA: Diagnosis not present

## 2020-05-23 DIAGNOSIS — S3992XA Unspecified injury of lower back, initial encounter: Secondary | ICD-10-CM | POA: Diagnosis not present

## 2020-06-09 DIAGNOSIS — G20 Parkinson's disease: Secondary | ICD-10-CM | POA: Diagnosis not present

## 2020-06-09 DIAGNOSIS — I639 Cerebral infarction, unspecified: Secondary | ICD-10-CM | POA: Diagnosis not present

## 2020-06-09 DIAGNOSIS — I482 Chronic atrial fibrillation, unspecified: Secondary | ICD-10-CM | POA: Diagnosis not present

## 2020-06-30 DIAGNOSIS — G4733 Obstructive sleep apnea (adult) (pediatric): Secondary | ICD-10-CM | POA: Diagnosis not present

## 2020-07-11 DIAGNOSIS — Z20822 Contact with and (suspected) exposure to covid-19: Secondary | ICD-10-CM | POA: Diagnosis not present

## 2020-08-14 DIAGNOSIS — U071 COVID-19: Secondary | ICD-10-CM | POA: Diagnosis not present

## 2020-08-26 DIAGNOSIS — F3341 Major depressive disorder, recurrent, in partial remission: Secondary | ICD-10-CM | POA: Diagnosis not present

## 2020-10-31 ENCOUNTER — Other Ambulatory Visit: Payer: Self-pay | Admitting: Family Medicine

## 2020-10-31 DIAGNOSIS — M5441 Lumbago with sciatica, right side: Secondary | ICD-10-CM

## 2020-10-31 DIAGNOSIS — M545 Low back pain, unspecified: Secondary | ICD-10-CM | POA: Diagnosis not present

## 2020-10-31 DIAGNOSIS — S50311A Abrasion of right elbow, initial encounter: Secondary | ICD-10-CM | POA: Diagnosis not present

## 2020-10-31 DIAGNOSIS — R296 Repeated falls: Secondary | ICD-10-CM | POA: Diagnosis not present

## 2020-10-31 DIAGNOSIS — M5442 Lumbago with sciatica, left side: Secondary | ICD-10-CM | POA: Diagnosis not present

## 2020-11-01 ENCOUNTER — Other Ambulatory Visit: Payer: Self-pay | Admitting: Family Medicine

## 2020-11-01 DIAGNOSIS — R296 Repeated falls: Secondary | ICD-10-CM

## 2020-11-01 DIAGNOSIS — M5441 Lumbago with sciatica, right side: Secondary | ICD-10-CM

## 2020-11-08 ENCOUNTER — Other Ambulatory Visit: Payer: Self-pay

## 2020-11-08 ENCOUNTER — Ambulatory Visit
Admission: RE | Admit: 2020-11-08 | Discharge: 2020-11-08 | Disposition: A | Discharge status: Home / Self Care | Payer: Medicare Other | Source: Ambulatory Visit | Attending: Family Medicine | Admitting: Family Medicine

## 2020-11-08 DIAGNOSIS — R296 Repeated falls: Secondary | ICD-10-CM

## 2020-11-08 DIAGNOSIS — M545 Low back pain, unspecified: Secondary | ICD-10-CM | POA: Diagnosis not present

## 2020-11-08 DIAGNOSIS — M48061 Spinal stenosis, lumbar region without neurogenic claudication: Secondary | ICD-10-CM | POA: Diagnosis not present

## 2020-11-08 DIAGNOSIS — M5441 Lumbago with sciatica, right side: Secondary | ICD-10-CM

## 2020-11-15 ENCOUNTER — Other Ambulatory Visit: Payer: Medicare Other

## 2020-11-17 ENCOUNTER — Ambulatory Visit: Payer: Medicare Other | Admitting: Physical Therapy

## 2020-11-17 ENCOUNTER — Ambulatory Visit: Payer: Medicare Other | Attending: Internal Medicine

## 2020-11-17 DIAGNOSIS — Z23 Encounter for immunization: Secondary | ICD-10-CM

## 2020-11-17 NOTE — Progress Notes (Signed)
   Covid-19 Vaccination Clinic  Name:  Bashir Marchetti    MRN: [id number] DOB: 1940/01/22  11/17/2020  Mr. Shell was observed post Covid-19 immunization for 15 minutes without incident. He was provided with Vaccine Information Sheet and instruction to access the V-Safe system.   Mr. Vandenberghe was instructed to call 911 with any severe reactions post vaccine: Difficulty breathing  Swelling of face and throat  A fast heartbeat  A bad rash all over body  Dizziness and weakness   Immunizations Administered     Name Date Dose VIS Date Route   Pfizer Covid-19 Vaccine Bivalent Booster 11/17/2020 10:24 AM 0.3 mL 09/03/2020 Intramuscular   Manufacturer: [place]   Lot: [id number]   [place]: [phone]

## 2020-12-05 ENCOUNTER — Other Ambulatory Visit (HOSPITAL_BASED_OUTPATIENT_CLINIC_OR_DEPARTMENT_OTHER): Payer: Self-pay

## 2020-12-05 MED ORDER — PFIZER COVID-19 VAC BIVALENT 30 MCG/0.3ML IM SUSP
INTRAMUSCULAR | 0 refills | Status: AC
  Filled 2020-12-05: qty 0.3, 1d supply, fill #0

## 2020-12-06 DIAGNOSIS — M48062 Spinal stenosis, lumbar region with neurogenic claudication: Secondary | ICD-10-CM | POA: Diagnosis not present

## 2020-12-15 DIAGNOSIS — M48062 Spinal stenosis, lumbar region with neurogenic claudication: Secondary | ICD-10-CM | POA: Diagnosis not present

## 2020-12-24 DIAGNOSIS — H26491 Other secondary cataract, right eye: Secondary | ICD-10-CM | POA: Diagnosis not present

## 2020-12-24 DIAGNOSIS — H349 Unspecified retinal vascular occlusion: Secondary | ICD-10-CM | POA: Diagnosis not present

## 2020-12-24 DIAGNOSIS — D3132 Benign neoplasm of left choroid: Secondary | ICD-10-CM | POA: Diagnosis not present

## 2020-12-24 DIAGNOSIS — H524 Presbyopia: Secondary | ICD-10-CM | POA: Diagnosis not present

## 2021-01-14 DIAGNOSIS — M48062 Spinal stenosis, lumbar region with neurogenic claudication: Secondary | ICD-10-CM | POA: Diagnosis not present

## 2021-02-02 DIAGNOSIS — M47816 Spondylosis without myelopathy or radiculopathy, lumbar region: Secondary | ICD-10-CM | POA: Diagnosis not present

## 2021-02-12 DIAGNOSIS — Z20822 Contact with and (suspected) exposure to covid-19: Secondary | ICD-10-CM | POA: Diagnosis not present

## 2021-02-13 DIAGNOSIS — Z20822 Contact with and (suspected) exposure to covid-19: Secondary | ICD-10-CM | POA: Diagnosis not present

## 2021-02-17 DIAGNOSIS — M47816 Spondylosis without myelopathy or radiculopathy, lumbar region: Secondary | ICD-10-CM | POA: Diagnosis not present

## 2021-03-16 DIAGNOSIS — M47816 Spondylosis without myelopathy or radiculopathy, lumbar region: Secondary | ICD-10-CM | POA: Diagnosis not present

## 2021-03-30 DIAGNOSIS — L812 Freckles: Secondary | ICD-10-CM | POA: Diagnosis not present

## 2021-03-30 DIAGNOSIS — D1801 Hemangioma of skin and subcutaneous tissue: Secondary | ICD-10-CM | POA: Diagnosis not present

## 2021-03-30 DIAGNOSIS — D2261 Melanocytic nevi of right upper limb, including shoulder: Secondary | ICD-10-CM | POA: Diagnosis not present

## 2021-03-30 DIAGNOSIS — D225 Melanocytic nevi of trunk: Secondary | ICD-10-CM | POA: Diagnosis not present

## 2021-03-30 DIAGNOSIS — Z85820 Personal history of malignant melanoma of skin: Secondary | ICD-10-CM | POA: Diagnosis not present

## 2021-03-31 ENCOUNTER — Other Ambulatory Visit: Payer: Self-pay

## 2021-03-31 ENCOUNTER — Other Ambulatory Visit: Payer: Self-pay | Admitting: Family Medicine

## 2021-03-31 ENCOUNTER — Ambulatory Visit
Admission: RE | Admit: 2021-03-31 | Discharge: 2021-03-31 | Disposition: A | Discharge status: Home / Self Care | Payer: Medicare Other | Source: Ambulatory Visit | Attending: Family Medicine | Admitting: Family Medicine

## 2021-03-31 DIAGNOSIS — S8012XA Contusion of left lower leg, initial encounter: Secondary | ICD-10-CM

## 2021-04-01 DIAGNOSIS — Z20822 Contact with and (suspected) exposure to covid-19: Secondary | ICD-10-CM | POA: Diagnosis not present

## 2021-04-17 DIAGNOSIS — Z20822 Contact with and (suspected) exposure to covid-19: Secondary | ICD-10-CM | POA: Diagnosis not present

## 2021-04-20 ENCOUNTER — Ambulatory Visit (INDEPENDENT_AMBULATORY_CARE_PROVIDER_SITE_OTHER): Payer: Medicare Other | Admitting: Orthopaedic Surgery

## 2021-04-20 ENCOUNTER — Ambulatory Visit (INDEPENDENT_AMBULATORY_CARE_PROVIDER_SITE_OTHER): Payer: Medicare Other

## 2021-04-20 ENCOUNTER — Encounter: Payer: Self-pay | Admitting: Orthopaedic Surgery

## 2021-04-20 DIAGNOSIS — M79605 Pain in left leg: Secondary | ICD-10-CM

## 2021-04-20 NOTE — Progress Notes (Signed)
The patient is a very pleasant 81 year old gentleman who comes today for evaluation treatment of left leg pain after he fell and hit his shin against a brick wall about 4 weeks ago.  2 weeks ago he had x-rays obtained through his primary care physician and they was negative for fracture.  He is still having pain along his shin.  He says it is getting a little bit better but he was concerned that the pain has not gone away.  He said there was a significant bruise several weeks ago when the injury first occurred.  He is not on blood thinning medication he is not a diabetic. ? ?His left knee and left ankle exam are entirely normal.  He has 5 out of 5 strength of the muscles around the knee and leg on the left side.  He is neurovascularly intact.  There is a well-healed small abrasion and bruising that she can see on the anterior proximal third of the tibia crest.  He is tender in this area.  There is no instability. ? ?2 views of the left tibia and fibula are seen today and compared to films from 2 weeks ago.  There is no periosteal reaction or evidence of fracture at all.  This did give him reassurance.  Offered him any anti-inflammatories but I did let him know this should continue to get better with time but it still reasonable at 4 weeks to still have pain given the superficial nature of where he injured himself and this can be a deep bone bruise that can take 6 to 8 weeks or so to feel better.  Follow-up is as needed. ?

## 2021-05-06 DIAGNOSIS — Z20822 Contact with and (suspected) exposure to covid-19: Secondary | ICD-10-CM | POA: Diagnosis not present

## 2021-05-08 ENCOUNTER — Ambulatory Visit: Payer: Medicare Other | Attending: Internal Medicine

## 2021-05-08 DIAGNOSIS — Z23 Encounter for immunization: Secondary | ICD-10-CM

## 2021-05-08 NOTE — Progress Notes (Signed)
? ?  Covid-19 Vaccination Clinic ? ?Name:  Jose Fuentes    ?MRN: [id number] ?DOB: [birth year] ? ?05/08/2021 ? ?Mr. Santagata was observed post Covid-19 immunization for 15 minutes without incident. He was provided with Vaccine Information Sheet and instruction to access the V-Safe system.  ? ?Mr. Lavine was instructed to call 911 with any severe reactions post vaccine: ?Difficulty breathing  ?Swelling of face and throat  ?A fast heartbeat  ?A bad rash all over body  ?Dizziness and weakness  ? ?Immunizations Administered   ? ? Name Date Dose VIS Date Route  ? Ambulance person Booster 05/08/2021  1:42 PM 0.3 mL 09/03/2020 Intramuscular  ? Manufacturer: [place]: [phone]  ? [place]: [phone]  ? ?  ?  ?

## 2021-05-14 ENCOUNTER — Other Ambulatory Visit (HOSPITAL_BASED_OUTPATIENT_CLINIC_OR_DEPARTMENT_OTHER): Payer: Self-pay

## 2021-05-14 MED ORDER — PFIZER COVID-19 VAC BIVALENT 30 MCG/0.3ML IM SUSP
INTRAMUSCULAR | 0 refills | Status: AC
  Filled 2021-05-14: qty 0.3, 1d supply, fill #0

## 2021-05-25 DIAGNOSIS — E785 Hyperlipidemia, unspecified: Secondary | ICD-10-CM | POA: Diagnosis not present

## 2021-05-25 DIAGNOSIS — R7303 Prediabetes: Secondary | ICD-10-CM | POA: Diagnosis not present

## 2021-05-25 DIAGNOSIS — I10 Essential (primary) hypertension: Secondary | ICD-10-CM | POA: Diagnosis not present

## 2021-05-27 ENCOUNTER — Other Ambulatory Visit: Payer: Self-pay | Admitting: Family Medicine

## 2021-05-27 DIAGNOSIS — R5381 Other malaise: Secondary | ICD-10-CM

## 2021-06-29 DIAGNOSIS — G4733 Obstructive sleep apnea (adult) (pediatric): Secondary | ICD-10-CM | POA: Diagnosis not present

## 2021-07-31 DIAGNOSIS — K229 Disease of esophagus, unspecified: Secondary | ICD-10-CM | POA: Diagnosis not present

## 2021-07-31 DIAGNOSIS — K219 Gastro-esophageal reflux disease without esophagitis: Secondary | ICD-10-CM | POA: Diagnosis not present

## 2021-07-31 DIAGNOSIS — Z86010 Personal history of colonic polyps: Secondary | ICD-10-CM | POA: Diagnosis not present

## 2021-08-20 ENCOUNTER — Other Ambulatory Visit: Payer: Self-pay | Admitting: Family Medicine

## 2021-08-20 DIAGNOSIS — M810 Age-related osteoporosis without current pathological fracture: Secondary | ICD-10-CM

## 2021-08-24 DIAGNOSIS — G4733 Obstructive sleep apnea (adult) (pediatric): Secondary | ICD-10-CM | POA: Diagnosis not present

## 2021-09-04 DIAGNOSIS — K449 Diaphragmatic hernia without obstruction or gangrene: Secondary | ICD-10-CM | POA: Diagnosis not present

## 2021-09-04 DIAGNOSIS — K2950 Unspecified chronic gastritis without bleeding: Secondary | ICD-10-CM | POA: Diagnosis not present

## 2021-09-04 DIAGNOSIS — D130 Benign neoplasm of esophagus: Secondary | ICD-10-CM | POA: Diagnosis not present

## 2021-09-04 DIAGNOSIS — K2289 Other specified disease of esophagus: Secondary | ICD-10-CM | POA: Diagnosis not present

## 2021-10-03 DIAGNOSIS — Z23 Encounter for immunization: Secondary | ICD-10-CM | POA: Diagnosis not present

## 2021-10-19 ENCOUNTER — Other Ambulatory Visit (HOSPITAL_BASED_OUTPATIENT_CLINIC_OR_DEPARTMENT_OTHER): Payer: Self-pay

## 2021-10-19 MED ORDER — COMIRNATY 30 MCG/0.3ML IM SUSY
PREFILLED_SYRINGE | INTRAMUSCULAR | 0 refills | Status: AC
  Filled 2021-10-19: qty 0.3, 1d supply, fill #0

## 2021-11-23 DIAGNOSIS — S0083XA Contusion of other part of head, initial encounter: Secondary | ICD-10-CM | POA: Diagnosis not present

## 2021-11-23 DIAGNOSIS — R2681 Unsteadiness on feet: Secondary | ICD-10-CM | POA: Diagnosis not present

## 2021-11-23 DIAGNOSIS — R296 Repeated falls: Secondary | ICD-10-CM | POA: Diagnosis not present

## 2021-12-02 ENCOUNTER — Encounter: Payer: Self-pay | Admitting: Neurology

## 2021-12-04 ENCOUNTER — Emergency Department (HOSPITAL_BASED_OUTPATIENT_CLINIC_OR_DEPARTMENT_OTHER)
Admission: EM | Admit: 2021-12-04 | Discharge: 2021-12-04 | Disposition: A | Discharge status: Home / Self Care | Payer: Medicare Other | Attending: Emergency Medicine | Admitting: Emergency Medicine

## 2021-12-04 ENCOUNTER — Other Ambulatory Visit: Payer: Self-pay

## 2021-12-04 ENCOUNTER — Encounter (HOSPITAL_BASED_OUTPATIENT_CLINIC_OR_DEPARTMENT_OTHER): Payer: Self-pay | Admitting: Emergency Medicine

## 2021-12-04 ENCOUNTER — Emergency Department (HOSPITAL_BASED_OUTPATIENT_CLINIC_OR_DEPARTMENT_OTHER): Payer: Medicare Other

## 2021-12-04 DIAGNOSIS — Z043 Encounter for examination and observation following other accident: Secondary | ICD-10-CM | POA: Diagnosis not present

## 2021-12-04 DIAGNOSIS — W19XXXA Unspecified fall, initial encounter: Secondary | ICD-10-CM | POA: Insufficient documentation

## 2021-12-04 DIAGNOSIS — S60112A Contusion of left thumb with damage to nail, initial encounter: Secondary | ICD-10-CM | POA: Diagnosis not present

## 2021-12-04 DIAGNOSIS — S6010XA Contusion of unspecified finger with damage to nail, initial encounter: Secondary | ICD-10-CM | POA: Diagnosis not present

## 2021-12-04 NOTE — ED Provider Notes (Signed)
[facility] Provider Note   CSN: [id number] Arrival date & time: 12/04/21  2022     History  Chief Complaint  Patient presents with   Hand Injury    Jose Fuentes is a 81 y.o. male.   Hand Injury  Pt is 81 year old male  Dropped a brick from a upright position onto his left thumb.  It seems that he has had left thumb pain since.  He has some bruising underneath his fingernail he informs me but no other bruising.  He is not on any anticoagulation.  He has no other injuries.  He has sensation in his thumb he tells me and states he can move his thumb well.       Home Medications Prior to Admission medications   Medication Sig Start Date End Date Taking? Authorizing Provider  atorvastatin (LIPITOR) 40 MG tablet Take 40 mg by mouth daily with supper.    [provider]  buPROPion (WELLBUTRIN XL) 300 MG 24 hr tablet Take 300 mg by mouth daily before supper.    [provider]  Cholecalciferol (VITAMIN D-3) 25 MCG (1000 UT) CAPS Take 1,000 Units by mouth daily with supper.    [provider]  COVID-19 mRNA bivalent vaccine, Pfizer, (PFIZER COVID-19 VAC BIVALENT) injection Inject into the muscle. 11/17/20   Carlyle Basques, MD  COVID-19 mRNA bivalent vaccine, Pfizer, (PFIZER COVID-19 VAC BIVALENT) injection Inject into the muscle. 05/08/21   Carlyle Basques, MD  COVID-19 mRNA Vac-TriS, Pfizer, (PFIZER-BIONT COVID-19 VAC-TRIS) SUSP injection Inject into the muscle. 04/18/20   Carlyle Basques, MD  COVID-19 mRNA vaccine [phone] (COMIRNATY) syringe Inject into the muscle. 10/19/21   Carlyle Basques, MD  desvenlafaxine 25 MG TB24 Take 75 mg by mouth daily. 02/15/20   Lavina Hamman, MD  folic acid (FOLVITE) 1 MG tablet Take 1 tablet (1 mg total) by mouth daily. 02/16/20   Lavina Hamman, MD  GLUCOSAMINE-CHONDROITIN PO Take 2 tablets by mouth daily with supper.    [provider]  Multiple Vitamin (MULTIVITAMIN WITH  MINERALS) TABS tablet Take 1 tablet by mouth daily. 02/16/20   Lavina Hamman, MD  pantoprazole (PROTONIX) 40 MG tablet Take 40 mg by mouth daily before supper.    [provider]  polyethylene glycol (MIRALAX / GLYCOLAX) 17 g packet Take 17 g by mouth daily as needed for mild constipation. 03/01/20   Dessa Phi, DO  thiamine 100 MG tablet Take 1 tablet (100 mg total) by mouth daily. 02/16/20   Lavina Hamman, MD  vitamin B-12 (CYANOCOBALAMIN) 1000 MCG tablet Take 1,000 mcg by mouth daily with supper.    [provider]      Allergies    Azithromycin, Escitalopram oxalate, and Fluoxetine hcl    Review of Systems   Review of Systems  Physical Exam Updated Vital Signs BP 129/76 (BP Location: Right Arm)   Pulse 71   Temp 98.2 F (36.8 C) (Oral)   Resp 17   Ht '5\' 6"'$  (1.676 m)   Wt 61.2 kg   SpO2 96%   BMI 21.79 kg/m  Physical Exam Vitals and nursing note reviewed.  Constitutional:      General: He is not in acute distress.    Appearance: Normal appearance. He is not ill-appearing.  HENT:     Head: Normocephalic and atraumatic.  Eyes:     General: No scleral icterus.       Right eye: No discharge.  Left eye: No discharge.     Conjunctiva/sclera: Conjunctivae normal.  Pulmonary:     Effort: Pulmonary effort is normal.     Breath sounds: No stridor.  Musculoskeletal:     Comments: FROM of all thumb digits  Skin:    Comments: L thumb w ~60% subungual hematoma  TTP of thumb no bruising apart from under nail  Neurological:     Mental Status: He is alert and oriented to person, place, and time. Mental status is at baseline.     ED Results / Procedures / Treatments   Labs (all labs ordered are listed, but only abnormal results are displayed) Labs Reviewed - No data to display  EKG None  Radiology DG Finger Thumb Left  Result Date: 12/04/2021 CLINICAL DATA:  Heavy object fell on thumb. EXAM: LEFT THUMB 2+V COMPARISON:  None Available.  FINDINGS: Degenerative changes in the IP joint and 1st carpometacarpal joint. No acute bony abnormality. Specifically, no fracture, subluxation, or dislocation. IMPRESSION: No acute bony abnormality. Electronically Signed   By: Rolm Baptise M.D.   On: 12/04/2021 21:21    Procedures Procedures  Cautery tool was used for trephination procedure.  Patient tolerated this procedure well.  Left thumb was cleaned with tap water and 2 holes were made with trephination tool.  Small amount of blood from each site.  Medications Ordered in ED Medications - No data to display  ED Course/ Medical Decision Making/ A&P                           Medical Decision Making Amount and/or Complexity of Data Reviewed Radiology: ordered.   Pt is 81 year old male  Dropped a brick from a upright position onto his left thumb.  It seems that he has had left thumb pain since.  He has some bruising underneath his fingernail he informs me but no other bruising.  He is not on any anticoagulation.  He has no other injuries.  He has sensation in his thumb he tells me and states he can move his thumb well.  Full range of motion of thumb.  Sensation intact in thumb.  Cap refill intact/less than 2 seconds.  Reviewed x-ray of left thumb which is without fracture.  Patient is distally neurovascularly intact.  Does have subungual hematoma which I drained/decompressed.  Discharged home with precautions.  Will follow-up with PCP   Final Clinical Impression(s) / ED Diagnoses Final diagnoses:  Subungual hematoma of digit of hand, initial encounter    Rx / DC Orders ED Discharge Orders     None         Tedd Sias, [place] 12/04/21 2344    Margette Fast, MD 12/10/21 1458

## 2021-12-04 NOTE — Discharge Instructions (Signed)
You do not have any fractures.  You have however crushed the tip of your thumb and this will hurt for several days.  Take Tylenol 1000 mg every 6 hours.  You had a procedure today called a trephination where a small hole was placed in your nail.  This was done to prevent pressure building up underneath the nail.

## 2021-12-04 NOTE — ED Notes (Signed)
Pt denies SI presently. States ongoing hx since childhood of thoughts, no attempts ever.

## 2021-12-04 NOTE — ED Triage Notes (Signed)
Pt states a brick was dropped (from inches away) onto his L thumb around 1500 today. Pt reports "tiny" break in skin near nail. No obvious deformity. No blood thinner use. No other injury.

## 2021-12-11 DIAGNOSIS — H26491 Other secondary cataract, right eye: Secondary | ICD-10-CM | POA: Diagnosis not present

## 2021-12-11 DIAGNOSIS — Z961 Presence of intraocular lens: Secondary | ICD-10-CM | POA: Diagnosis not present

## 2021-12-11 DIAGNOSIS — H52203 Unspecified astigmatism, bilateral: Secondary | ICD-10-CM | POA: Diagnosis not present

## 2021-12-30 DIAGNOSIS — H26491 Other secondary cataract, right eye: Secondary | ICD-10-CM | POA: Diagnosis not present

## 2022-01-11 ENCOUNTER — Ambulatory Visit: Payer: Medicare Other | Admitting: Neurology

## 2022-02-25 ENCOUNTER — Ambulatory Visit
Admission: RE | Admit: 2022-02-25 | Discharge: 2022-02-25 | Disposition: A | Discharge status: Home / Self Care | Payer: Medicare Other | Source: Ambulatory Visit | Attending: Family Medicine | Admitting: Family Medicine

## 2022-02-25 DIAGNOSIS — M85831 Other specified disorders of bone density and structure, right forearm: Secondary | ICD-10-CM | POA: Diagnosis not present

## 2022-02-25 DIAGNOSIS — M810 Age-related osteoporosis without current pathological fracture: Secondary | ICD-10-CM

## 2022-03-16 DIAGNOSIS — M67432 Ganglion, left wrist: Secondary | ICD-10-CM | POA: Diagnosis not present

## 2022-04-05 DIAGNOSIS — Z85820 Personal history of malignant melanoma of skin: Secondary | ICD-10-CM | POA: Diagnosis not present

## 2022-04-05 DIAGNOSIS — D1801 Hemangioma of skin and subcutaneous tissue: Secondary | ICD-10-CM | POA: Diagnosis not present

## 2022-04-05 DIAGNOSIS — L812 Freckles: Secondary | ICD-10-CM | POA: Diagnosis not present

## 2022-04-05 DIAGNOSIS — D692 Other nonthrombocytopenic purpura: Secondary | ICD-10-CM | POA: Diagnosis not present

## 2022-04-05 DIAGNOSIS — L821 Other seborrheic keratosis: Secondary | ICD-10-CM | POA: Diagnosis not present

## 2022-04-05 DIAGNOSIS — D225 Melanocytic nevi of trunk: Secondary | ICD-10-CM | POA: Diagnosis not present

## 2022-04-05 DIAGNOSIS — M6740 Ganglion, unspecified site: Secondary | ICD-10-CM | POA: Diagnosis not present

## 2022-04-26 DIAGNOSIS — G8918 Other acute postprocedural pain: Secondary | ICD-10-CM | POA: Diagnosis not present

## 2022-04-26 DIAGNOSIS — M67432 Ganglion, left wrist: Secondary | ICD-10-CM | POA: Diagnosis not present

## 2022-04-26 DIAGNOSIS — M67442 Ganglion, left hand: Secondary | ICD-10-CM | POA: Diagnosis not present

## 2022-04-26 DIAGNOSIS — R2232 Localized swelling, mass and lump, left upper limb: Secondary | ICD-10-CM | POA: Diagnosis not present

## 2022-05-03 DIAGNOSIS — M47816 Spondylosis without myelopathy or radiculopathy, lumbar region: Secondary | ICD-10-CM | POA: Diagnosis not present

## 2022-05-07 DIAGNOSIS — I482 Chronic atrial fibrillation, unspecified: Secondary | ICD-10-CM | POA: Diagnosis not present

## 2022-05-07 DIAGNOSIS — Z79899 Other long term (current) drug therapy: Secondary | ICD-10-CM | POA: Diagnosis not present

## 2022-05-07 DIAGNOSIS — G20A1 Parkinson's disease without dyskinesia, without mention of fluctuations: Secondary | ICD-10-CM | POA: Diagnosis not present

## 2022-05-17 DIAGNOSIS — F3341 Major depressive disorder, recurrent, in partial remission: Secondary | ICD-10-CM | POA: Diagnosis not present

## 2022-05-18 DIAGNOSIS — M47816 Spondylosis without myelopathy or radiculopathy, lumbar region: Secondary | ICD-10-CM | POA: Diagnosis not present

## 2022-06-08 DIAGNOSIS — R7309 Other abnormal glucose: Secondary | ICD-10-CM | POA: Diagnosis not present

## 2022-06-08 DIAGNOSIS — M8588 Other specified disorders of bone density and structure, other site: Secondary | ICD-10-CM | POA: Diagnosis not present

## 2022-06-08 DIAGNOSIS — E785 Hyperlipidemia, unspecified: Secondary | ICD-10-CM | POA: Diagnosis not present

## 2022-06-08 DIAGNOSIS — I480 Paroxysmal atrial fibrillation: Secondary | ICD-10-CM | POA: Diagnosis not present

## 2022-06-14 DIAGNOSIS — M48062 Spinal stenosis, lumbar region with neurogenic claudication: Secondary | ICD-10-CM | POA: Diagnosis not present

## 2022-06-17 ENCOUNTER — Emergency Department (HOSPITAL_BASED_OUTPATIENT_CLINIC_OR_DEPARTMENT_OTHER): Payer: Medicare Other

## 2022-06-17 ENCOUNTER — Other Ambulatory Visit: Payer: Self-pay

## 2022-06-17 ENCOUNTER — Emergency Department (HOSPITAL_BASED_OUTPATIENT_CLINIC_OR_DEPARTMENT_OTHER)
Admission: EM | Admit: 2022-06-17 | Discharge: 2022-06-17 | Disposition: A | Discharge status: Home / Self Care | Payer: Medicare Other | Attending: Emergency Medicine | Admitting: Emergency Medicine

## 2022-06-17 ENCOUNTER — Encounter (HOSPITAL_BASED_OUTPATIENT_CLINIC_OR_DEPARTMENT_OTHER): Payer: Self-pay | Admitting: Urology

## 2022-06-17 DIAGNOSIS — S01511A Laceration without foreign body of lip, initial encounter: Secondary | ICD-10-CM | POA: Insufficient documentation

## 2022-06-17 DIAGNOSIS — W108XXA Fall (on) (from) other stairs and steps, initial encounter: Secondary | ICD-10-CM | POA: Diagnosis not present

## 2022-06-17 DIAGNOSIS — S0993XA Unspecified injury of face, initial encounter: Secondary | ICD-10-CM | POA: Diagnosis not present

## 2022-06-17 DIAGNOSIS — R220 Localized swelling, mass and lump, head: Secondary | ICD-10-CM | POA: Diagnosis not present

## 2022-06-17 MED ORDER — LIDOCAINE HCL (PF) 1 % IJ SOLN
10.0000 mL | Freq: Once | INTRAMUSCULAR | Status: AC
  Administered 2022-06-17: 10 mL
  Filled 2022-06-17: qty 10

## 2022-06-17 NOTE — Discharge Instructions (Addendum)
As we discussed, you will need the sutures removed in 5 to 7 days either here, your primary care doctor, or urgent care.  You may shower normally.  You may want to apply some ice packs tomorrow to the lip as it will likely be swollen and sore.  You can take ibuprofen or Tylenol for pain.  You may return to the emergency room if any worsening symptoms.

## 2022-06-17 NOTE — ED Triage Notes (Signed)
Pt state fall and hit upper lip on a basket he was carrying  Bleeding continued  Small lac noted to outside of upper lip and moderated lac inside of upper lip   Not on blood thinners

## 2022-06-17 NOTE — ED Provider Notes (Signed)
EMERGENCY DEPARTMENT AT [facility] Provider Note   CSN: [id number] Arrival date & time: 06/17/22  1951     History Chief Complaint  Patient presents with   Lip Laceration    Jose Fuentes is a 82 y.o. male patient who presents to the emergency department today for further evaluation of a lip laceration.  Patient states he was carrying some items and an MD laundry basket when he missed a step and fell forward.  He did not lose consciousness.  He is not on any anticoagulation.  HPI     Home Medications Prior to Admission medications   Medication Sig Start Date End Date Taking? Authorizing Provider  atorvastatin (LIPITOR) 40 MG tablet Take 40 mg by mouth daily with supper.    [provider]  buPROPion (WELLBUTRIN XL) 300 MG 24 hr tablet Take 300 mg by mouth daily before supper.    [provider]  Cholecalciferol (VITAMIN D-3) 25 MCG (1000 UT) CAPS Take 1,000 Units by mouth daily with supper.    [provider]  COVID-19 mRNA bivalent vaccine, Pfizer, (PFIZER COVID-19 VAC BIVALENT) injection Inject into the muscle. 11/17/20   Judyann Munson, MD  COVID-19 mRNA bivalent vaccine, Pfizer, (PFIZER COVID-19 VAC BIVALENT) injection Inject into the muscle. 05/08/21   Judyann Munson, MD  COVID-19 mRNA Vac-TriS, Pfizer, (PFIZER-BIONT COVID-19 VAC-TRIS) SUSP injection Inject into the muscle. 04/18/20   Judyann Munson, MD  COVID-19 mRNA vaccine [phone] (COMIRNATY) syringe Inject into the muscle. 10/19/21   Judyann Munson, MD  desvenlafaxine 25 MG TB24 Take 75 mg by mouth daily. 02/15/20   Rolly Salter, MD  folic acid (FOLVITE) 1 MG tablet Take 1 tablet (1 mg total) by mouth daily. 02/16/20   Rolly Salter, MD  GLUCOSAMINE-CHONDROITIN PO Take 2 tablets by mouth daily with supper.    [provider]  Multiple Vitamin (MULTIVITAMIN WITH MINERALS) TABS tablet Take 1 tablet by mouth daily. 02/16/20   Rolly Salter, MD   pantoprazole (PROTONIX) 40 MG tablet Take 40 mg by mouth daily before supper.    [provider]  polyethylene glycol (MIRALAX / GLYCOLAX) 17 g packet Take 17 g by mouth daily as needed for mild constipation. 03/01/20   Noralee Stain, DO  thiamine 100 MG tablet Take 1 tablet (100 mg total) by mouth daily. 02/16/20   Rolly Salter, MD  vitamin B-12 (CYANOCOBALAMIN) 1000 MCG tablet Take 1,000 mcg by mouth daily with supper.    [provider]      Allergies    Azithromycin, Escitalopram oxalate, and Fluoxetine hcl    Review of Systems   Review of Systems  All other systems reviewed and are negative.   Physical Exam Updated Vital Signs BP (!) 130/96 (BP Location: Right Arm)   Pulse 74   Temp 98.5 F (36.9 C) (Oral)   Resp 18   Ht 5\' 6"  (1.676 m)   Wt 61.2 kg   SpO2 99%   BMI 21.78 kg/m  Physical Exam Vitals and nursing note reviewed.  Constitutional:      Appearance: Normal appearance.  HENT:     Head: Normocephalic.      Mouth/Throat:     Comments: There is a 4 cm laceration on the inner upper lip but is not through and through. Eyes:     General:        Right eye: No discharge.        Left eye: No discharge.  Conjunctiva/sclera: Conjunctivae normal.  Pulmonary:     Effort: Pulmonary effort is normal.  Skin:    General: Skin is warm and dry.     Findings: No rash.  Neurological:     General: No focal deficit present.     Mental Status: He is alert.  Psychiatric:        Mood and Affect: Mood normal.        Behavior: Behavior normal.     ED Results / Procedures / Treatments   Labs (all labs ordered are listed, but only abnormal results are displayed) Labs Reviewed - No data to display  EKG None  Radiology CT Maxillofacial Wo Contrast  Result Date: 06/17/2022 CLINICAL DATA:  Blunt facial trauma.  Fell and hit upper lip. EXAM: CT MAXILLOFACIAL WITHOUT CONTRAST TECHNIQUE: Multidetector CT imaging of the maxillofacial structures was  performed. Multiplanar CT image reconstructions were also generated. RADIATION DOSE REDUCTION: This exam was performed according to the departmental dose-optimization program which includes automated exposure control, adjustment of the mA and/or kV according to patient size and/or use of iterative reconstruction technique. COMPARISON:  CTA head and neck and reconstructions 02/14/2020 FINDINGS: Osseous: No fracture or mandibular dislocation. No destructive process. Mild joint narrowing is again noted at both TMJs. Orbits: Negative. No traumatic or inflammatory finding. Old lens replacements are again shown. Sinuses: There is mild membrane thickening right maxillary sinus and in a few bilateral ethmoid air cells. The rest of the paranasal sinuses, bilateral mastoid air cells, and both middle ears are clear. The nasal septum is midline. The turbinates are intact with small bilateral middle turbinate concha bullosae again noted and mild membrane disease in the nasal passages. Sinus drainage pathways including both ostiomeatal complexes are patent. Soft tissues: There is soft tissue swelling in the upper lip eccentric to the left. No other focal abnormality. Limited intracranial: No significant or unexpected finding. IMPRESSION: 1. Soft tissue swelling in the upper lip eccentric to the left. 2. No fractures or interval changes. 3. Sinus membrane disease. Electronically Signed   By: Almira Bar M.D.   On: 06/17/2022 22:11    Procedures .Marland KitchenLaceration Repair  Date/Time: 06/17/2022 11:09 PM  Performed by: Teressa Lower, PA-C Authorized by: Teressa Lower, PA-C   Consent:    Consent obtained:  Verbal   Consent given by:  Patient   Risks discussed:  Infection and need for additional repair Universal protocol:    Procedure explained and questions answered to patient or proxy's satisfaction: yes     Relevant documents present and verified: yes     Test results available: yes     Imaging studies  available: yes     Required blood products, implants, devices, and special equipment available: yes     Site/side marked: yes     Immediately prior to procedure, a time out was called: yes     Patient identity confirmed:  Verbally with patient and arm band Anesthesia:    Anesthesia method:  Local infiltration   Local anesthetic:  Lidocaine 1% w/o epi Laceration details:    Location:  Lip   Lip location:  Upper interior lip   Length (cm):  4 Pre-procedure details:    Preparation:  Patient was prepped and draped in usual sterile fashion Exploration:    Hemostasis achieved with:  Direct pressure   Imaging obtained comment:  CT imaging   Wound exploration: wound explored through full range of motion     Wound extent: areolar tissue violated  Wound extent: fascia not violated, no foreign body, no signs of injury, no tendon damage, no underlying fracture and no vascular damage   Treatment:    Area cleansed with:  Saline   Amount of cleaning:  Standard   Irrigation solution:  Sterile saline   Debridement:  None   Undermining:  None Skin repair:    Repair method:  Sutures   Suture size:  5-0   Suture material:  Chromic gut   Suture technique:  Simple interrupted   Number of sutures:  2 Approximation:    Approximation:  Close   Vermilion border well-aligned: yes   Repair type:    Repair type:  Simple Post-procedure details:    Dressing:  Open (no dressing)   Procedure completion:  Tolerated well, no immediate complications .Marland KitchenLaceration Repair  Date/Time: 06/17/2022 11:15 PM  Performed by: Teressa Lower, PA-C Authorized by: Teressa Lower, PA-C   Consent:    Consent obtained:  Verbal   Consent given by:  Patient   Risks discussed:  Infection and need for additional repair Universal protocol:    Procedure explained and questions answered to patient or proxy's satisfaction: yes     Relevant documents present and verified: yes     Test results available: yes      Imaging studies available: yes     Required blood products, implants, devices, and special equipment available: yes     Immediately prior to procedure, a time out was called: yes     Patient identity confirmed:  Verbally with patient and arm band Anesthesia:    Anesthesia method:  Local infiltration   Local anesthetic:  Lidocaine 1% w/o epi Laceration details:    Location:  Lip   Lip location:  Upper interior lip   Length (cm):  1 Pre-procedure details:    Preparation:  Patient was prepped and draped in usual sterile fashion Exploration:    Limited defect created (wound extended): no     Hemostasis achieved with:  Direct pressure   Imaging outcome: foreign body not noted     Wound exploration: wound explored through full range of motion     Wound extent: areolar tissue violated     Wound extent: fascia not violated, no foreign body, no signs of injury, no nerve damage, no tendon damage, no underlying fracture and no vascular damage   Treatment:    Area cleansed with:  Saline   Amount of cleaning:  Standard   Irrigation solution:  Sterile saline   Debridement:  None   Undermining:  None Skin repair:    Repair method:  Sutures   Suture size:  5-0   Suture material:  Chromic gut   Suture technique:  Simple interrupted   Number of sutures:  1 Approximation:    Approximation:  Close   Vermilion border well-aligned: yes   Repair type:    Repair type:  Simple Post-procedure details:    Dressing:  Open (no dressing)   Procedure completion:  Tolerated well, no immediate complications .Marland KitchenLaceration Repair  Date/Time: 06/17/2022 11:16 PM  Performed by: Teressa Lower, PA-C Authorized by: Teressa Lower, PA-C   Consent:    Consent obtained:  Verbal   Consent given by:  Patient   Risks discussed:  Infection, pain and need for additional repair Universal protocol:    Procedure explained and questions answered to patient or proxy's satisfaction: yes     Relevant documents  present and verified: yes  Test results available: yes     Imaging studies available: yes     Patient identity confirmed:  Arm band and verbally with patient Anesthesia:    Anesthesia method:  Local infiltration   Local anesthetic:  Lidocaine 1% w/o epi Laceration details:    Location:  Lip   Lip location:  Upper interior lip   Length (cm):  1.5   Depth (mm):  5 Pre-procedure details:    Preparation:  Patient was prepped and draped in usual sterile fashion Exploration:    Hemostasis achieved with:  Direct pressure   Imaging outcome: foreign body not noted     Wound exploration: wound explored through full range of motion     Wound extent: areolar tissue violated     Wound extent: fascia not violated, no foreign body, no signs of injury, no nerve damage, no tendon damage, no underlying fracture and no vascular damage   Treatment:    Area cleansed with:  Saline   Amount of cleaning:  Standard   Irrigation solution:  Sterile saline   Debridement:  None   Undermining:  None Skin repair:    Repair method:  Sutures   Suture size:  5-0   Suture material:  Chromic gut   Suture technique:  Simple interrupted   Number of sutures:  2 Approximation:    Approximation:  Close Repair type:    Repair type:  Intermediate Post-procedure details:    Dressing:  Open (no dressing)   Procedure completion:  Tolerated well, no immediate complications .Marland KitchenLaceration Repair  Date/Time: 06/17/2022 11:18 PM  Performed by: Teressa Lower, PA-C Authorized by: Teressa Lower, PA-C   Consent:    Consent obtained:  Verbal   Consent given by:  Patient   Risks, benefits, and alternatives were discussed: yes     Risks discussed:  Infection Universal protocol:    Procedure explained and questions answered to patient or proxy's satisfaction: yes     Relevant documents present and verified: yes     Test results available: yes     Imaging studies available: yes     Required blood products,  implants, devices, and special equipment available: yes     Immediately prior to procedure, a time out was called: yes     Patient identity confirmed:  Verbally with patient and arm band Anesthesia:    Anesthesia method:  Local infiltration   Local anesthetic:  Lidocaine 1% w/o epi Laceration details:    Location:  Lip   Lip location:  Upper exterior lip   Length (cm):  2   Depth (mm):  1 Pre-procedure details:    Preparation:  Patient was prepped and draped in usual sterile fashion Exploration:    Hemostasis achieved with:  Direct pressure   Imaging outcome: foreign body not noted     Wound exploration: wound explored through full range of motion     Wound extent: areolar tissue violated     Wound extent: fascia not violated, no foreign body, no signs of injury, no nerve damage, no tendon damage, no underlying fracture and no vascular damage     Contaminated: no   Treatment:    Area cleansed with:  Saline   Amount of cleaning:  Standard   Irrigation solution:  Sterile water   Debridement:  None   Undermining:  None Skin repair:    Repair method:  Sutures   Suture size:  6-0   Suture material:  Prolene   Suture technique:  Simple interrupted   Number of sutures:  3 Approximation:    Approximation:  Close   Vermilion border well-aligned: yes   Repair type:    Repair type:  Simple Post-procedure details:    Dressing:  Open (no dressing)   Procedure completion:  Tolerated well, no immediate complications     Medications Ordered in ED Medications  lidocaine (PF) (XYLOCAINE) 1 % injection 10 mL (has no administration in time range)    ED Course/ Medical Decision Making/ A&P Clinical Course as of 06/17/22 2325  Thu Jun 17, 2022  2309 On further exploration of the laceration on the inner lip there are 3 separate lacerations with 1 that is deep in the mucosal layer.  They range between 1 and 4 cm [CF]    Clinical Course User Index [CF] Teressa Lower, PA-C   {    Click here for ABCD2, HEART and other calculators  Medical Decision Making Wayne Siemon is a 82 y.o. male patient who presents to the emergency department today for further evaluation of laceration after him falling forward and striking his face on the outside deck.  Will likely get CT maxillofacial to further evaluate for any further deep damage.  Patient was not knocked out.  Will likely need suture repair both in the internal upper lip and the external upper lip.  I closed the wounds with simple erupted sutures.  Please see separate laceration repair notes for each laceration.  There were 4 in total.  None of them were through and through and they did not involve the vermilion border.  I instructed the patient on wound care instructions in addition to suture removal in 5 to 7 days. Strict return precautions were discussed. He is safe for discharge at this time.    Amount and/or Complexity of Data Reviewed Radiology: ordered.  Risk Prescription drug management.    Final Clinical Impression(s) / ED Diagnoses Final diagnoses:  Facial injury, initial encounter  Lip laceration, initial encounter    Rx / DC Orders ED Discharge Orders     None         Jolyn Lent 06/17/22 2325    Vanetta Mulders, MD 06/21/22 1212

## 2022-06-17 NOTE — ED Notes (Signed)
Lidocaine and suture cart to bedside.  

## 2022-06-18 DIAGNOSIS — S0993XA Unspecified injury of face, initial encounter: Secondary | ICD-10-CM | POA: Diagnosis not present

## 2022-06-28 DIAGNOSIS — G20A1 Parkinson's disease without dyskinesia, without mention of fluctuations: Secondary | ICD-10-CM | POA: Diagnosis not present

## 2022-06-29 DIAGNOSIS — M48062 Spinal stenosis, lumbar region with neurogenic claudication: Secondary | ICD-10-CM | POA: Diagnosis not present

## 2022-07-05 ENCOUNTER — Other Ambulatory Visit (HOSPITAL_BASED_OUTPATIENT_CLINIC_OR_DEPARTMENT_OTHER): Payer: Self-pay

## 2022-07-09 ENCOUNTER — Other Ambulatory Visit (HOSPITAL_BASED_OUTPATIENT_CLINIC_OR_DEPARTMENT_OTHER): Payer: Self-pay

## 2022-07-09 ENCOUNTER — Other Ambulatory Visit: Payer: Self-pay | Admitting: Physician Assistant

## 2022-07-09 ENCOUNTER — Ambulatory Visit
Admission: RE | Admit: 2022-07-09 | Discharge: 2022-07-09 | Disposition: A | Discharge status: Home / Self Care | Payer: Medicare Other | Source: Ambulatory Visit | Attending: Physician Assistant | Admitting: Physician Assistant

## 2022-07-09 DIAGNOSIS — M25521 Pain in right elbow: Secondary | ICD-10-CM

## 2022-07-09 DIAGNOSIS — M79621 Pain in right upper arm: Secondary | ICD-10-CM | POA: Diagnosis not present

## 2022-07-09 DIAGNOSIS — M79604 Pain in right leg: Secondary | ICD-10-CM | POA: Diagnosis not present

## 2022-07-09 DIAGNOSIS — M25551 Pain in right hip: Secondary | ICD-10-CM | POA: Diagnosis not present

## 2022-07-09 DIAGNOSIS — W19XXXA Unspecified fall, initial encounter: Secondary | ICD-10-CM | POA: Diagnosis not present

## 2022-07-09 DIAGNOSIS — R936 Abnormal findings on diagnostic imaging of limbs: Secondary | ICD-10-CM | POA: Diagnosis not present

## 2022-07-09 DIAGNOSIS — M25421 Effusion, right elbow: Secondary | ICD-10-CM | POA: Diagnosis not present

## 2022-07-09 MED ORDER — AREXVY 120 MCG/0.5ML IM SUSR
0.5000 mL | Freq: Once | INTRAMUSCULAR | 0 refills | Status: AC
  Filled 2022-07-09: qty 0.5, 1d supply, fill #0

## 2022-07-19 DIAGNOSIS — M25521 Pain in right elbow: Secondary | ICD-10-CM | POA: Diagnosis not present

## 2022-07-22 DIAGNOSIS — G20A1 Parkinson's disease without dyskinesia, without mention of fluctuations: Secondary | ICD-10-CM | POA: Diagnosis not present

## 2022-07-26 DIAGNOSIS — M48062 Spinal stenosis, lumbar region with neurogenic claudication: Secondary | ICD-10-CM | POA: Diagnosis not present

## 2022-08-30 DIAGNOSIS — G4733 Obstructive sleep apnea (adult) (pediatric): Secondary | ICD-10-CM | POA: Diagnosis not present

## 2022-09-10 DIAGNOSIS — G20A1 Parkinson's disease without dyskinesia, without mention of fluctuations: Secondary | ICD-10-CM | POA: Diagnosis not present

## 2022-09-14 ENCOUNTER — Other Ambulatory Visit: Payer: Self-pay | Admitting: Student

## 2022-09-14 DIAGNOSIS — G20A1 Parkinson's disease without dyskinesia, without mention of fluctuations: Secondary | ICD-10-CM

## 2022-09-17 ENCOUNTER — Other Ambulatory Visit (HOSPITAL_BASED_OUTPATIENT_CLINIC_OR_DEPARTMENT_OTHER): Payer: Self-pay

## 2022-09-17 DIAGNOSIS — Z23 Encounter for immunization: Secondary | ICD-10-CM | POA: Diagnosis not present

## 2022-09-17 MED ORDER — COVID-19 MRNA VAC-TRIS(PFIZER) 30 MCG/0.3ML IM SUSY
0.3000 mL | PREFILLED_SYRINGE | Freq: Once | INTRAMUSCULAR | 0 refills | Status: AC
  Filled 2022-09-17: qty 0.3, 1d supply, fill #0

## 2022-10-15 DIAGNOSIS — S0083XA Contusion of other part of head, initial encounter: Secondary | ICD-10-CM | POA: Diagnosis not present

## 2022-10-15 DIAGNOSIS — S0181XA Laceration without foreign body of other part of head, initial encounter: Secondary | ICD-10-CM | POA: Diagnosis not present

## 2022-10-15 DIAGNOSIS — R296 Repeated falls: Secondary | ICD-10-CM | POA: Diagnosis not present

## 2022-10-15 DIAGNOSIS — G3189 Other specified degenerative diseases of nervous system: Secondary | ICD-10-CM | POA: Diagnosis not present

## 2022-10-15 DIAGNOSIS — R9082 White matter disease, unspecified: Secondary | ICD-10-CM | POA: Diagnosis not present

## 2022-10-15 DIAGNOSIS — W01198A Fall on same level from slipping, tripping and stumbling with subsequent striking against other object, initial encounter: Secondary | ICD-10-CM | POA: Diagnosis not present

## 2022-10-15 DIAGNOSIS — Z79899 Other long term (current) drug therapy: Secondary | ICD-10-CM | POA: Diagnosis not present

## 2022-10-15 DIAGNOSIS — S01112A Laceration without foreign body of left eyelid and periocular area, initial encounter: Secondary | ICD-10-CM | POA: Diagnosis not present

## 2022-10-16 DIAGNOSIS — W19XXXA Unspecified fall, initial encounter: Secondary | ICD-10-CM | POA: Diagnosis not present

## 2022-10-16 DIAGNOSIS — S0592XA Unspecified injury of left eye and orbit, initial encounter: Secondary | ICD-10-CM | POA: Diagnosis not present

## 2022-10-18 ENCOUNTER — Ambulatory Visit
Admission: RE | Admit: 2022-10-18 | Discharge: 2022-10-18 | Disposition: A | Discharge status: Home / Self Care | Payer: Medicare Other | Source: Ambulatory Visit | Attending: Student | Admitting: Student

## 2022-10-18 ENCOUNTER — Other Ambulatory Visit (HOSPITAL_BASED_OUTPATIENT_CLINIC_OR_DEPARTMENT_OTHER): Payer: Self-pay

## 2022-10-18 DIAGNOSIS — G20A1 Parkinson's disease without dyskinesia, without mention of fluctuations: Secondary | ICD-10-CM | POA: Diagnosis not present

## 2022-10-18 DIAGNOSIS — R269 Unspecified abnormalities of gait and mobility: Secondary | ICD-10-CM | POA: Diagnosis not present

## 2022-10-18 DIAGNOSIS — G4733 Obstructive sleep apnea (adult) (pediatric): Secondary | ICD-10-CM | POA: Diagnosis not present

## 2022-10-18 MED ORDER — INFLUENZA VAC A&B SURF ANT ADJ 0.5 ML IM SUSY
0.5000 mL | PREFILLED_SYRINGE | Freq: Once | INTRAMUSCULAR | 0 refills | Status: AC
  Filled 2022-10-18: qty 0.5, 1d supply, fill #0

## 2022-10-22 ENCOUNTER — Encounter: Payer: Self-pay | Admitting: Physical Therapy

## 2022-10-22 ENCOUNTER — Ambulatory Visit: Payer: Medicare Other | Attending: Family Medicine | Admitting: Physical Therapy

## 2022-10-22 DIAGNOSIS — M6281 Muscle weakness (generalized): Secondary | ICD-10-CM | POA: Insufficient documentation

## 2022-10-22 DIAGNOSIS — R2689 Other abnormalities of gait and mobility: Secondary | ICD-10-CM | POA: Insufficient documentation

## 2022-10-22 DIAGNOSIS — R293 Abnormal posture: Secondary | ICD-10-CM | POA: Diagnosis not present

## 2022-10-22 DIAGNOSIS — Z4802 Encounter for removal of sutures: Secondary | ICD-10-CM | POA: Diagnosis not present

## 2022-10-22 DIAGNOSIS — R29818 Other symptoms and signs involving the nervous system: Secondary | ICD-10-CM | POA: Diagnosis not present

## 2022-10-22 DIAGNOSIS — R2681 Unsteadiness on feet: Secondary | ICD-10-CM | POA: Diagnosis not present

## 2022-10-22 DIAGNOSIS — S0181XD Laceration without foreign body of other part of head, subsequent encounter: Secondary | ICD-10-CM | POA: Diagnosis not present

## 2022-10-22 DIAGNOSIS — Z9181 History of falling: Secondary | ICD-10-CM | POA: Diagnosis not present

## 2022-10-22 NOTE — Therapy (Signed)
OUTPATIENT PHYSICAL THERAPY NEURO EVALUATION   Patient Name: Jose Fuentes MRN: [id number] DOB:Sep 18, 1940, 82 y.o., male Today's Date: 10/22/2022   PCP: Laurann Montana, MD   REFERRING PROVIDER: Janice Coffin, PA-C  END OF SESSION:  PT End of Session - 10/22/22 1414     Visit Number 1    Number of Visits 13    Date for PT Re-Evaluation 12/21/22    Authorization Type Medicare - Part A & B    PT Start Time 1411   pt late to eval   PT Stop Time 1445    PT Time Calculation (min) 34 min    Equipment Utilized During Treatment Gait belt    Activity Tolerance Patient tolerated treatment well    Behavior During Therapy WFL for tasks assessed/performed             Past Medical History:  Diagnosis Date   Atrial fibrillation (HCC)    Depression    History of melanoma    Hyperlipidemia    Hypertension    Peyronie disease    Past Surgical History:  Procedure Laterality Date   KNEE SURGERY Left    meniscal tear   MELANOMA EXCISION     Thumb replacement Left    TONSILLECTOMY     Patient Active Problem List   Diagnosis Date Noted   Rectal trauma 03/01/2020   HLD (hyperlipidemia) 03/01/2020   TIA (transient ischemic attack) 02/14/2020   Gait instability 02/14/2020   Alcohol use 02/14/2020   Depression 02/14/2020   GERD (gastroesophageal reflux disease) 02/14/2020   AF (paroxysmal atrial fibrillation) (HCC) 06/11/2014    ONSET DATE: 09/10/2022  REFERRING DIAG: G20.A1 (ICD-10-CM) - Parkinson's disease without dyskinesia, without mention of fluctuations  THERAPY DIAG:  Unsteadiness on feet  Other abnormalities of gait and mobility  History of falling  Other symptoms and signs involving the nervous system  Rationale for Evaluation and Treatment: Rehabilitation  SUBJECTIVE:                                                                                                                                                                                              SUBJECTIVE STATEMENT: GOES BY [name]  Balance is really bad. Has noticed changes with his balance for about 5 years. Sometimes catches his toe on something and gets him to fall. Has been starting to carry a stick which helps. Last Friday, was carrying in groceries and fell over threshold and into the house. Hit his eye on the handle of the door into the house and had to go to the ER. Shuffles his feet, feels like he always  did that but it is worse now. Denies freezing episodes. Feels like he is bent forwards when he walks. Does not do any formal exercise, has a [employer], but haven't used it since COVID. Has trouble with buttoning.    Pt accompanied by:  Wife, [name]  PERTINENT HISTORY: PMH: Parkinson's disease with mouth tremor, shuffling gait, imbalance, hypophonia, dysphagia, decreased arm swing left side, decreased sense of smell, onset February 2022, Depression, History of TIA in Feb 2022, Lumbar degenerative disc disease, A fib, HLD, HTN  Patient has discontinued the Carbidopa-Levodopa due to excessive fatigue.  07/22/2022 Syn-one biopsy results (lab receipt date 06/29/2022 ; lab report date 07/15/2022  Synucleinopathy: There is no pathologic evidence of phosphorylated alpha-synuclein deposition within cutaneous  nerves. The absence of alpha-synuclein does not exclude a diagnosis of synucleinopathy.   PAIN:  Are you having pain? No  PRECAUTIONS: Fall  FALLS: Has patient fallen in last 6 months? Yes. Number of falls >10 falls  LIVING ENVIRONMENT: Lives with: lives with their spouse Lives in: House/apartment Stairs: Yes: Internal: 14 steps; can reach both and External: 3 steps; none and looking into getting these railings Has following equipment at home: Walker - 2 wheeled, Grab bars, and has a walking stick he made out of wood  PLOF: Independent and Leisure: Programmer, multimedia, used to like hiking   PATIENT GOALS: Wants to learn to move bigger, take longer steps   OBJECTIVE:   Note: Objective measures were completed at Evaluation unless otherwise noted.   COGNITION: Overall cognitive status: Within functional limits for tasks assessed   SENSATION: WFL  COORDINATION: Heel to shin: slower to perform LLE    POSTURE: rounded shoulders and forward head  LOWER EXTREMITY ROM:     Limited knee extension LLE>RLE  LOWER EXTREMITY MMT:    MMT Right Eval Left Eval  Hip flexion 5 5  Hip extension    Hip abduction    Hip adduction    Hip internal rotation    Hip external rotation    Knee flexion 5 4+  Knee extension 5 5  Ankle dorsiflexion 4+ 5  Ankle plantarflexion    Ankle inversion    Ankle eversion    (Blank rows = not tested)  BED MOBILITY:  Pt reports no difficulty   TRANSFERS: Assistive device utilized: None  Sit to stand: SBA Stand to sit: SBA  Pt reports initially feeling off balance when standing up   Pt reports difficulty getting up from the floor    GAIT: Gait pattern: step through pattern, decreased arm swing- Right, decreased arm swing- Left, decreased stride length, Right foot flat, Left foot flat, shuffling, decreased trunk rotation, and trunk flexed Distance walked: Clinic distances Assistive device utilized: None Level of assistance: SBA Comments: Pt reports difficulty with side stepping or turning in small spaces   FUNCTIONAL TESTS:  5 times sit to stand: 12.1 seconds with no UE support, decr anterior weight shift during 4th and 5th rep 10 meter walk test: 12.97 seconds = 2.53 ft/sec    [facility] PT Assessment - 10/22/22 1437       Standardized Balance Assessment   Standardized Balance Assessment Timed Up and Go Test      Timed Up and Go Test   Normal TUG (seconds) 12.7    Manual TUG (seconds) 10.7    Cognitive TUG (seconds) 11.6   counting backwards by 3s             TODAY'S TREATMENT:  N/A during eval  PATIENT EDUCATION: Education details: Clinical findings, POC, role of PT in PD, discussed speech therapy (pt also has a referral for this), but pt would rather just focus on PT at this time Person educated: Patient and Spouse Education method: Explanation Education comprehension: verbalized understanding  HOME EXERCISE PROGRAM: Will provide at future session.  GOALS: Goals reviewed with patient? Yes  SHORT TERM GOALS: Target date: 11/19/2022  Pt will be independent with initial HEP for PD in order to build upon functional gains made in therapy.  Baseline: Goal status: INITIAL  2.  miniBEST to be assessed with LTG written.  Baseline:  Goal status: INITIAL  3.  Pt and pt's spouse will verbalize understanding of fall prevention in the home.  Baseline:  Goal status: INITIAL  4.  Pt will improve gait speed with LRAD vs. No AD to at least 2.8 ft/sec in order to demo improved community mobility.  Baseline: 12.97 seconds = 2.53 ft/sec Goal status: INITIAL   LONG TERM GOALS: Target date: 12/17/2022  Pt will be independent with final HEP for PD in order to build upon functional gains made in therapy. Baseline:  Goal status: INITIAL  2.  Pt will improve gait speed with LRAD vs. No AD to at least 3.1 ft/sec in order to demo improved community mobility. Baseline:  Goal status: INITIAL  3.  miniBEST goal to be written. Baseline:  Goal status: INITIAL  4.  Pt will be able to get on and off the floor with supervision for improved fall recovery.  Baseline:  Goal status: INITIAL  5. Pt will verbalize understanding of local Parkinson's disease resources, including options for continue community fitness.  Baseline:  Goal status: INITIAL   ASSESSMENT:  CLINICAL IMPRESSION: Patient is a 82 year old male referred to Neuro OPPT for PD.   Pt's PMH is significant for: Parkinson's disease with mouth tremor, Depression, History of TIA in Feb 2022, Lumbar  degenerative disc disease, A fib, HLD, HTN . The following deficits were present during the exam: bradykinesia, impaired timing/coordination of gait, impaired balance, shuffled gait with decr arm swing, posture abnormalities, decr functional strength. Pt is not at a risk of falls per 5x sit <> stand and TUG, but has had a hx of multiple falls. Will perform miniBEST at next session (did not have time during eval). Pt's gait speed indicates a limited community ambulator. Pt would benefit from skilled PT to address these impairments and functional limitations to maximize functional mobility independence and decr fall risk.    OBJECTIVE IMPAIRMENTS: Abnormal gait, decreased activity tolerance, decreased balance, decreased coordination, decreased knowledge of condition, decreased knowledge of use of DME, difficulty walking, decreased strength, impaired flexibility, and postural dysfunction.   ACTIVITY LIMITATIONS: bending, stairs, transfers, and locomotion level  PARTICIPATION LIMITATIONS: community activity  PERSONAL FACTORS: Age, Behavior pattern, Past/current experiences, Time since onset of injury/illness/exacerbation, and 3+ comorbidities: Parkinson's disease with mouth tremor, shuffling gait, imbalance, hypophonia, dysphagia, decreased arm swing left side, decreased sense of smell, onset February 2022, Depression, History of TIA in Feb 2022, Lumbar degenerative disc disease, A fib, HLD, HTN  are also affecting patient's functional outcome.   REHAB POTENTIAL: Good  CLINICAL DECISION MAKING: Evolving/moderate complexity  EVALUATION COMPLEXITY: Moderate  PLAN:  PT FREQUENCY: 2x/week  PT DURATION: 8 weeks  PLANNED INTERVENTIONS: 97164- PT Re-evaluation, 97110-Therapeutic exercises, 97530- Therapeutic activity, 97112- Neuromuscular re-education, 97535- Self Care, 16109- Manual therapy, [phone]- Gait training, Balance training, Stair training, and DME instructions  PLAN  FOR NEXT SESSION: perform  miniBEST and write goal. Initiate HEP for balance, standing PWR moves. Work on balance tasks, and gait with arm swing. Maybe try rollator or appropriate AD to decr risk of future falls    Drake Leach, PT, DPT 10/22/2022, 3:49 PM

## 2022-10-29 ENCOUNTER — Ambulatory Visit: Payer: Medicare Other | Admitting: Physical Therapy

## 2022-10-29 DIAGNOSIS — Z9181 History of falling: Secondary | ICD-10-CM

## 2022-10-29 DIAGNOSIS — M6281 Muscle weakness (generalized): Secondary | ICD-10-CM | POA: Diagnosis not present

## 2022-10-29 DIAGNOSIS — R2681 Unsteadiness on feet: Secondary | ICD-10-CM

## 2022-10-29 DIAGNOSIS — R29818 Other symptoms and signs involving the nervous system: Secondary | ICD-10-CM | POA: Diagnosis not present

## 2022-10-29 DIAGNOSIS — R2689 Other abnormalities of gait and mobility: Secondary | ICD-10-CM

## 2022-10-29 DIAGNOSIS — R293 Abnormal posture: Secondary | ICD-10-CM | POA: Diagnosis not present

## 2022-10-29 NOTE — Therapy (Signed)
OUTPATIENT PHYSICAL THERAPY NEURO TREATMENT   Patient Name: Jose Fuentes MRN: [id number] DOB:1940-03-21, 82 y.o., male Today's Date: 10/29/2022   PCP: Laurann Montana, MD   REFERRING PROVIDER: Janice Coffin, PA-C  END OF SESSION:  PT End of Session - 10/29/22 1403     Visit Number 2    Number of Visits 13    Date for PT Re-Evaluation 12/21/22    Authorization Type Medicare - Part A & B    PT Start Time 1402    PT Stop Time 1450    PT Time Calculation (min) 48 min    Equipment Utilized During Treatment --    Activity Tolerance Patient tolerated treatment well    Behavior During Therapy WFL for tasks assessed/performed              Past Medical History:  Diagnosis Date   Atrial fibrillation (HCC)    Depression    History of melanoma    Hyperlipidemia    Hypertension    Peyronie disease    Past Surgical History:  Procedure Laterality Date   KNEE SURGERY Left    meniscal tear   MELANOMA EXCISION     Thumb replacement Left    TONSILLECTOMY     Patient Active Problem List   Diagnosis Date Noted   Rectal trauma 03/01/2020   HLD (hyperlipidemia) 03/01/2020   TIA (transient ischemic attack) 02/14/2020   Gait instability 02/14/2020   Alcohol use 02/14/2020   Depression 02/14/2020   GERD (gastroesophageal reflux disease) 02/14/2020   AF (paroxysmal atrial fibrillation) (HCC) 06/11/2014    ONSET DATE: 09/10/2022  REFERRING DIAG: G20.A1 (ICD-10-CM) - Parkinson's disease without dyskinesia, without mention of fluctuations  THERAPY DIAG:  Unsteadiness on feet  Other abnormalities of gait and mobility  History of falling  Rationale for Evaluation and Treatment: Rehabilitation  SUBJECTIVE:                                                                                                                                                                                             SUBJECTIVE STATEMENT: GOES BY Jose Fuentes  Pt reports doing well. Denies falls or  acute changes since last visit. No pain.   Pt accompanied by:  Wife, [name]  PERTINENT HISTORY: PMH: Parkinson's disease with mouth tremor, shuffling gait, imbalance, hypophonia, dysphagia, decreased arm swing left side, decreased sense of smell, onset February 2022, Depression, History of TIA in Feb 2022, Lumbar degenerative disc disease, A fib, HLD, HTN  Patient has discontinued the Carbidopa-Levodopa due to excessive fatigue.  07/22/2022 Syn-one biopsy results (lab receipt date 06/29/2022 ; lab report date 07/15/2022  Synucleinopathy: There is no pathologic evidence of phosphorylated alpha-synuclein deposition within cutaneous  nerves. The absence of alpha-synuclein does not exclude a diagnosis of synucleinopathy.   PAIN:  Are you having pain? No  PRECAUTIONS: Fall  FALLS: Has patient fallen in last 6 months? Yes. Number of falls >10 falls  LIVING ENVIRONMENT: Lives with: lives with their spouse Lives in: House/apartment Stairs: Yes: Internal: 14 steps; can reach both and External: 3 steps; none and looking into getting these railings Has following equipment at home: Walker - 2 wheeled, Grab bars, and has a walking stick he made out of wood  PLOF: Independent and Leisure: Programmer, multimedia, used to like hiking   PATIENT GOALS: Wants to learn to move bigger, take longer steps   OBJECTIVE:  Note: Objective measures were completed at Evaluation unless otherwise noted.   COGNITION: Overall cognitive status: Within functional limits for tasks assessed   SENSATION: WFL  COORDINATION: Heel to shin: slower to perform LLE    POSTURE: rounded shoulders and forward head  LOWER EXTREMITY ROM:     Limited knee extension LLE>RLE  LOWER EXTREMITY MMT:    MMT Right Eval Left Eval  Hip flexion 5 5  Hip extension    Hip abduction    Hip adduction    Hip internal rotation    Hip external rotation    Knee flexion 5 4+  Knee extension 5 5  Ankle dorsiflexion 4+ 5  Ankle  plantarflexion    Ankle inversion    Ankle eversion    (Blank rows = not tested)  BED MOBILITY:  Pt reports no difficulty   TRANSFERS: Assistive device utilized: None  Sit to stand: SBA Stand to sit: SBA  Pt reports initially feeling off balance when standing up   Pt reports difficulty getting up from the floor    GAIT: Gait pattern: step through pattern, decreased arm swing- Right, decreased arm swing- Left, decreased stride length, Right foot flat, Left foot flat, shuffling, decreased trunk rotation, and trunk flexed Distance walked: Clinic distances Assistive device utilized: None Level of assistance: SBA Comments: Pt reports difficulty with side stepping or turning in small spaces   FUNCTIONAL TESTS:  5 times sit to stand: 12.1 seconds with no UE support, decr anterior weight shift during 4th and 5th rep 10 meter walk test: 12.97 seconds = 2.53 ft/sec   TODAY'S TREATMENT:  Ther Act  Majority of session spent discussing PMH, atypical Parkinsonisms and role of therapy in PD management. Pt reports his greatest challenge is not picking up his feet and he tends to have falls on stairs. Currently has no rails on his steps but wife is working to get them installed.  Provided pt and wife w/PD community resources handout w/strong encouragement to reintegrate into fitness, as pt was going to [facility] regularly pre-covid. Pt interested in [facility] boxing but was advised that we will assess his balance first to determine safety w/this. Pt and wife verbalized understanding.     PATIENT EDUCATION: Education details: See above  Person educated: Patient and Spouse Education method: Explanation Education comprehension: verbalized understanding  HOME EXERCISE PROGRAM: Will provide at future session.  GOALS: Goals reviewed with patient? Yes  SHORT TERM GOALS: Target date: 11/19/2022  Pt will be independent with initial HEP for PD in order to build upon functional gains made in  therapy.  Baseline: Goal status: INITIAL  2.  miniBEST to be assessed with LTG written.  Baseline:  Goal status: INITIAL  3.  Pt and pt's spouse will verbalize understanding of fall prevention in the home.  Baseline:  Goal status: INITIAL  4.  Pt will improve gait speed with LRAD vs. No AD to at least 2.8 ft/sec in order to demo improved community mobility.  Baseline: 12.97 seconds = 2.53 ft/sec Goal status: INITIAL   LONG TERM GOALS: Target date: 12/17/2022  Pt will be independent with final HEP for PD in order to build upon functional gains made in therapy. Baseline:  Goal status: INITIAL  2.  Pt will improve gait speed with LRAD vs. No AD to at least 3.1 ft/sec in order to demo improved community mobility. Baseline:  Goal status: INITIAL  3.  miniBEST goal to be written. Baseline:  Goal status: INITIAL  4.  Pt will be able to get on and off the floor with supervision for improved fall recovery.  Baseline:  Goal status: INITIAL  5. Pt will verbalize understanding of local Parkinson's disease resources, including options for continue community fitness.  Baseline:  Goal status: INITIAL   ASSESSMENT:  CLINICAL IMPRESSION: Emphasis of skilled PT session on pt education on PD community resources and role of PT on PD. Pt reports being afraid of the dark and has more balance deficits at night. Pt also reports majority of falls occur on the stairs, but wife is working to [employer]. Strongly encouraged pt to begin exercising again, as he was prior to covid. Will assess balance next session to determine which PD classes pt would be safe to attend, as pt interested in these. Continue POC.    OBJECTIVE IMPAIRMENTS: Abnormal gait, decreased activity tolerance, decreased balance, decreased coordination, decreased knowledge of condition, decreased knowledge of use of DME, difficulty walking, decreased strength, impaired flexibility, and postural dysfunction.   ACTIVITY  LIMITATIONS: bending, stairs, transfers, and locomotion level  PARTICIPATION LIMITATIONS: community activity  PERSONAL FACTORS: Age, Behavior pattern, Past/current experiences, Time since onset of injury/illness/exacerbation, and 3+ comorbidities: Parkinson's disease with mouth tremor, shuffling gait, imbalance, hypophonia, dysphagia, decreased arm swing left side, decreased sense of smell, onset February 2022, Depression, History of TIA in Feb 2022, Lumbar degenerative disc disease, A fib, HLD, HTN  are also affecting patient's functional outcome.   REHAB POTENTIAL: Good  CLINICAL DECISION MAKING: Evolving/moderate complexity  EVALUATION COMPLEXITY: Moderate  PLAN:  PT FREQUENCY: 2x/week  PT DURATION: 8 weeks  PLANNED INTERVENTIONS: 97164- PT Re-evaluation, 97110-Therapeutic exercises, 97530- Therapeutic activity, 97112- Neuromuscular re-education, 97535- Self Care, 16109- Manual therapy, [phone]- Gait training, Balance training, Stair training, and DME instructions  PLAN FOR NEXT SESSION: perform miniBEST and write goal. Initiate HEP for balance, standing PWR moves. Work on balance tasks, and gait with arm swing. Maybe try rollator or appropriate AD to decr risk of future falls    Jill Alexanders Kasra Melvin, PT, DPT 10/29/2022, 2:51 PM

## 2022-11-01 ENCOUNTER — Ambulatory Visit: Payer: Medicare Other | Admitting: Physical Therapy

## 2022-11-01 VITALS — BP 151/70 | HR 73

## 2022-11-01 DIAGNOSIS — M6281 Muscle weakness (generalized): Secondary | ICD-10-CM

## 2022-11-01 DIAGNOSIS — R293 Abnormal posture: Secondary | ICD-10-CM

## 2022-11-01 DIAGNOSIS — R29818 Other symptoms and signs involving the nervous system: Secondary | ICD-10-CM | POA: Diagnosis not present

## 2022-11-01 DIAGNOSIS — Z9181 History of falling: Secondary | ICD-10-CM | POA: Diagnosis not present

## 2022-11-01 DIAGNOSIS — R2681 Unsteadiness on feet: Secondary | ICD-10-CM

## 2022-11-01 DIAGNOSIS — R2689 Other abnormalities of gait and mobility: Secondary | ICD-10-CM

## 2022-11-01 NOTE — Therapy (Signed)
OUTPATIENT PHYSICAL THERAPY NEURO TREATMENT   Patient Name: Jose Fuentes MRN: [id number] DOB:[birth year], 82 y.o., male Today's Date: 11/01/2022   PCP: Jose Montana, MD   REFERRING PROVIDER: Janice Coffin, PA-C  END OF SESSION:  PT End of Session - 11/01/22 1019     Visit Number 3    Number of Visits 13    Date for PT Re-Evaluation 12/21/22    Authorization Type Medicare - Part A & B    PT Start Time 1016    PT Stop Time 1058    PT Time Calculation (min) 42 min    Equipment Utilized During Treatment Gait belt    Activity Tolerance Patient tolerated treatment well    Behavior During Therapy WFL for tasks assessed/performed               Past Medical History:  Diagnosis Date   Atrial fibrillation (HCC)    Depression    History of melanoma    Hyperlipidemia    Hypertension    Peyronie disease    Past Surgical History:  Procedure Laterality Date   KNEE SURGERY Left    meniscal tear   MELANOMA EXCISION     Thumb replacement Left    TONSILLECTOMY     Patient Active Problem List   Diagnosis Date Noted   Rectal trauma 03/01/2020   HLD (hyperlipidemia) 03/01/2020   TIA (transient ischemic attack) 02/14/2020   Gait instability 02/14/2020   Alcohol use 02/14/2020   Depression 02/14/2020   GERD (gastroesophageal reflux disease) 02/14/2020   AF (paroxysmal atrial fibrillation) (HCC) 06/11/2014    ONSET DATE: 09/10/2022  REFERRING DIAG: G20.A1 (ICD-10-CM) - Parkinson's disease without dyskinesia, without mention of fluctuations  THERAPY DIAG:  Unsteadiness on feet  Other abnormalities of gait and mobility  Muscle weakness (generalized)  Abnormal posture  Rationale for Evaluation and Treatment: Rehabilitation  SUBJECTIVE:                                                                                                                                                                                             SUBJECTIVE STATEMENT: GOES BY  [name]  Pt reports doing well. Denies falls or acute changes since last visit. No pain.   Pt accompanied by:  Wife, Jose Fuentes (in [place])  PERTINENT HISTORY: PMH: Parkinson's disease with mouth tremor, shuffling gait, imbalance, hypophonia, dysphagia, decreased arm swing left side, decreased sense of smell, onset February 2022, Depression, History of TIA in Feb 2022, Lumbar degenerative disc disease, A fib, HLD, HTN  Patient has discontinued the Carbidopa-Levodopa due to excessive fatigue.  07/22/2022 Syn-one biopsy results (lab receipt date  06/29/2022 ; lab report date 07/15/2022  Synucleinopathy: There is no pathologic evidence of phosphorylated alpha-synuclein deposition within cutaneous  nerves. The absence of alpha-synuclein does not exclude a diagnosis of synucleinopathy.   PAIN:  Are you having pain? No  PRECAUTIONS: Fall  FALLS: Has patient fallen in last 6 months? Yes. Number of falls >10 falls  LIVING ENVIRONMENT: Lives with: lives with their spouse Lives in: House/apartment Stairs: Yes: Internal: 14 steps; can reach both and External: 3 steps; none and looking into getting these railings Has following equipment at home: Walker - 2 wheeled, Grab bars, and has a walking stick he made out of wood  PLOF: Independent and Leisure: Programmer, multimedia, used to like hiking   PATIENT GOALS: Wants to learn to move bigger, take longer steps   OBJECTIVE:  Note: Objective measures were completed at Evaluation unless otherwise noted.   COGNITION: Overall cognitive status: Within functional limits for tasks assessed   SENSATION: WFL  COORDINATION: Heel to shin: slower to perform LLE    POSTURE: rounded shoulders and forward head  LOWER EXTREMITY ROM:     Limited knee extension LLE>RLE  LOWER EXTREMITY MMT:    MMT Right Eval Left Eval  Hip flexion 5 5  Hip extension    Hip abduction    Hip adduction    Hip internal rotation    Hip external rotation    Knee flexion 5 4+   Knee extension 5 5  Ankle dorsiflexion 4+ 5  Ankle plantarflexion    Ankle inversion    Ankle eversion    (Blank rows = not tested)  BED MOBILITY:  Pt reports no difficulty   TRANSFERS: Assistive device utilized: None  Sit to stand: SBA Stand to sit: SBA  Pt reports initially feeling off balance when standing up   Pt reports difficulty getting up from the floor    GAIT: Gait pattern: step through pattern, decreased arm swing- Right, decreased arm swing- Left, decreased stride length, Right foot flat, Left foot flat, shuffling, decreased trunk rotation, and trunk flexed Distance walked: Clinic distances Assistive device utilized: None Level of assistance: SBA Comments: Pt reports difficulty with side stepping or turning in small spaces   FUNCTIONAL TESTS:  5 times sit to stand: 12.1 seconds with no UE support, decr anterior weight shift during 4th and 5th rep 10 meter walk test: 12.97 seconds = 2.53 ft/sec   VITALS  Vitals:   11/01/22 1021  BP: (!) 151/70  Pulse: 73     TODAY'S TREATMENT:  Ther Act  Assessed vitals (see above) and systolic BP elevated but within limits for therapy   [facility] PT Assessment - 11/01/22 1024       Balance   Balance Assessed Yes      Standardized Balance Assessment   Standardized Balance Assessment Mini-BESTest      Mini-BESTest   Sit To Stand Normal: Comes to stand without use of hands and stabilizes independently.    Rise to Toes Normal: Stable for 3 s with maximum height.    Stand on one leg (left) Moderate: < 20 s   3.71s   Stand on one leg (right) Moderate: < 20 s   4.4s   Stand on one leg - lowest score 1    Compensatory Stepping Correction - Forward Moderate: More than one step is required to recover equilibrium   1 large step and 2 small steps   Compensatory Stepping Correction - Backward Moderate: More than one step is required  to recover equilibrium   2 large steps   Compensatory Stepping Correction - Left Lateral  Moderate: Several steps to recover equilibrium   Crossover step w/RLE and 2 small steps   Compensatory Stepping Correction - Right Lateral Severe:  Falls, or cannot step    Stepping Corredtion Lateral - lowest score 0    Stance - Feet together, eyes open, firm surface  Normal: 30s   lateral truncal lean to L   Stance - Feet together, eyes closed, foam surface  Moderate: < 30s   7.56s   Incline - Eyes Closed Normal: Stands independently 30s and aligns with gravity    Change in Gait Speed Moderate: Unable to change walking speed or signs of imbalance   L foot caught w/slow speed   Walk with head turns - Horizontal Moderate: performs head turns with reduction in gait speed.   Lateral deviations to L side   Walk with pivot turns Moderate:Turns with feet close SLOW (>4 steps) with good balance.    Step over obstacles Normal: Able to step over box with minimal change of gait speed and with good balance.    Timed UP & GO with Dual Task Moderate: Dual Task affects either counting OR walking (>10%) when compared to the TUG without Dual Task.    Mini-BEST total score 18             Ther Ex  Established initial HEP for improved vestibular input and transfers (see bolded below):  Standing on pillows/dog bed w/narrow BOS with eyes closed in corner, x45s hold. Noted increased lateral sway to L side but no overt LOB.  Sit to Stand Without Arm Support, x10 reps. Min multimodal cues for proper form and body positioning. Pt reported activity felt easy when he performed w/anterior weight shift.   PATIENT EDUCATION: Education details: MiniBest results, initial HEP  Person educated: Patient and Spouse Education method: Explanation Education comprehension: verbalized understanding  HOME EXERCISE PROGRAM: Access Code: [email or web address] URL: [email or web address] Date: 11/01/2022 Prepared by: Alethia Berthold Noe [place]  Exercises - Standing on pillows/dog bed with eyes closed in corner   - 1 x daily - 7 x  weekly - 3 sets - 30-45 second hold - Sit to Stand Without Arm Support  - 1 x daily - 7 x weekly - 3 sets - 10 reps  GOALS: Goals reviewed with patient? Yes  SHORT TERM GOALS: Target date: 11/19/2022  Pt will be independent with initial HEP for PD in order to build upon functional gains made in therapy.  Baseline: Goal status: INITIAL  2.  miniBEST to be assessed with LTG written.  Baseline: 18/28 Goal status: MET  3.  Pt and pt's spouse will verbalize understanding of fall prevention in the home.  Baseline:  Goal status: INITIAL  4.  Pt will improve gait speed with LRAD vs. No AD to at least 2.8 ft/sec in order to demo improved community mobility.  Baseline: 12.97 seconds = 2.53 ft/sec Goal status: INITIAL   LONG TERM GOALS: Target date: 12/17/2022  Pt will be independent with final HEP for PD in order to build upon functional gains made in therapy. Baseline:  Goal status: INITIAL  2.  Pt will improve gait speed with LRAD vs. No AD to at least 3.1 ft/sec in order to demo improved community mobility. Baseline:  Goal status: INITIAL  3.  Pt will improve MiniBest to 22/28 for decreased fall risk and improvement with compensatory stepping strategies.   Baseline: 18/28 Goal status: REVISED  4.  Pt will be able to get on and off the floor with supervision for improved fall recovery.  Baseline:  Goal status: INITIAL  5. Pt will verbalize understanding of local Parkinson's disease resources, including options for continue community fitness.  Baseline:  Goal status: INITIAL   ASSESSMENT:  CLINICAL IMPRESSION: Emphasis of skilled PT session on balance assessment via MiniBest and establishing initial HEP. Pt scored a 18/28 on MiniBest, indicative of increased fall risk. Pt most challenged by stepping strategy in R direction, R lateral weight shifting and balance w/reduced visual input. Established HEP w/emphasis on vestibular input and midline orientation for transfers. Pt  will benefit from standing PWR moves next session as well as stepping strategy practice. Continue POC.    OBJECTIVE IMPAIRMENTS: Abnormal gait, decreased activity tolerance, decreased balance, decreased coordination, decreased knowledge of condition, decreased knowledge of use of DME, difficulty walking, decreased strength, impaired flexibility, and postural dysfunction.   ACTIVITY LIMITATIONS: bending, stairs, transfers, and locomotion level  PARTICIPATION LIMITATIONS: community activity  PERSONAL FACTORS: Age, Behavior pattern, Past/current experiences, Time since onset of injury/illness/exacerbation, and 3+ comorbidities: Parkinson's disease with mouth tremor, shuffling gait, imbalance, hypophonia, dysphagia, decreased arm swing left side, decreased sense of smell, onset February 2022, Depression, History of TIA in Feb 2022, Lumbar degenerative disc disease, A fib, HLD, HTN  are also affecting patient's functional outcome.   REHAB POTENTIAL: Good  CLINICAL DECISION MAKING: Evolving/moderate complexity  EVALUATION COMPLEXITY: Moderate  PLAN:  PT FREQUENCY: 2x/week  PT DURATION: 8 weeks  PLANNED INTERVENTIONS: 97164- PT Re-evaluation, 97110-Therapeutic exercises, 97530- Therapeutic activity, 97112- Neuromuscular re-education, 97535- Self Care, 53664- Manual therapy, 40347- Gait training, Balance training, Stair training, and DME instructions  PLAN FOR NEXT SESSION: Add to HEP for balance, standing PWR moves. Work on balance tasks, and gait with arm swing. Maybe try rollator or appropriate AD to decr risk of future falls. Lateral weight shift to R side, stepping strategies    Brent Taillon E Imanie Darrow, PT, DPT 11/01/2022, 10:59 AM

## 2022-11-05 ENCOUNTER — Encounter: Payer: Self-pay | Admitting: Physical Therapy

## 2022-11-05 ENCOUNTER — Ambulatory Visit: Payer: Medicare Other | Attending: Family Medicine | Admitting: Physical Therapy

## 2022-11-05 VITALS — BP 139/64 | HR 71

## 2022-11-05 DIAGNOSIS — R2681 Unsteadiness on feet: Secondary | ICD-10-CM | POA: Diagnosis not present

## 2022-11-05 DIAGNOSIS — R293 Abnormal posture: Secondary | ICD-10-CM | POA: Diagnosis not present

## 2022-11-05 DIAGNOSIS — M6281 Muscle weakness (generalized): Secondary | ICD-10-CM | POA: Insufficient documentation

## 2022-11-05 DIAGNOSIS — R2689 Other abnormalities of gait and mobility: Secondary | ICD-10-CM | POA: Insufficient documentation

## 2022-11-05 NOTE — Therapy (Signed)
OUTPATIENT PHYSICAL THERAPY NEURO TREATMENT   Patient Name: Jose Fuentes MRN: [id number] DOB:1940/12/09, 82 y.o., male Today's Date: 11/05/2022   PCP: Laurann Montana, MD   REFERRING PROVIDER: Janice Coffin, PA-C  END OF SESSION:  PT End of Session - 11/05/22 1010     Visit Number 4    Number of Visits 13    Date for PT Re-Evaluation 12/21/22    Authorization Type Medicare - Part A & B    PT Start Time 1010    PT Stop Time 1051    PT Time Calculation (min) 41 min    Equipment Utilized During Treatment Gait belt    Activity Tolerance Patient tolerated treatment well    Behavior During Therapy WFL for tasks assessed/performed               Past Medical History:  Diagnosis Date   Atrial fibrillation (HCC)    Depression    History of melanoma    Hyperlipidemia    Hypertension    Peyronie disease    Past Surgical History:  Procedure Laterality Date   KNEE SURGERY Left    meniscal tear   MELANOMA EXCISION     Thumb replacement Left    TONSILLECTOMY     Patient Active Problem List   Diagnosis Date Noted   Rectal trauma 03/01/2020   HLD (hyperlipidemia) 03/01/2020   TIA (transient ischemic attack) 02/14/2020   Gait instability 02/14/2020   Alcohol use 02/14/2020   Depression 02/14/2020   GERD (gastroesophageal reflux disease) 02/14/2020   AF (paroxysmal atrial fibrillation) (HCC) 06/11/2014    ONSET DATE: 09/10/2022  REFERRING DIAG: G20.A1 (ICD-10-CM) - Parkinson's disease without dyskinesia, without mention of fluctuations  THERAPY DIAG:  Unsteadiness on feet  Other abnormalities of gait and mobility  Muscle weakness (generalized)  Abnormal posture  Rationale for Evaluation and Treatment: Rehabilitation  SUBJECTIVE:                                                                                                                                                                                             SUBJECTIVE STATEMENT: GOES BY  [name]  No falls. Tried the exercises at home, most challenged with the eyes closed and head motions.   Pt accompanied by:  Self  PERTINENT HISTORY: PMH: Parkinson's disease with mouth tremor, shuffling gait, imbalance, hypophonia, dysphagia, decreased arm swing left side, decreased sense of smell, onset February 2022, Depression, History of TIA in Feb 2022, Lumbar degenerative disc disease, A fib, HLD, HTN  Patient has discontinued the Carbidopa-Levodopa due to excessive fatigue.  07/22/2022 Syn-one biopsy results (lab receipt date 06/29/2022 ;  lab report date 07/15/2022  Synucleinopathy: There is no pathologic evidence of phosphorylated alpha-synuclein deposition within cutaneous  nerves. The absence of alpha-synuclein does not exclude a diagnosis of synucleinopathy.   PAIN:  Are you having pain? No  PRECAUTIONS: Fall  FALLS: Has patient fallen in last 6 months? Yes. Number of falls >10 falls  LIVING ENVIRONMENT: Lives with: lives with their spouse Lives in: House/apartment Stairs: Yes: Internal: 14 steps; can reach both and External: 3 steps; none and looking into getting these railings Has following equipment at home: Walker - 2 wheeled, Grab bars, and has a walking stick he made out of wood  PLOF: Independent and Leisure: Programmer, multimedia, used to like hiking   PATIENT GOALS: Wants to learn to move bigger, take longer steps   OBJECTIVE:  Note: Objective measures were completed at Evaluation unless otherwise noted.   COGNITION: Overall cognitive status: Within functional limits for tasks assessed   SENSATION: WFL  COORDINATION: Heel to shin: slower to perform LLE    POSTURE: rounded shoulders and forward head  LOWER EXTREMITY ROM:     Limited knee extension LLE>RLE  LOWER EXTREMITY MMT:    MMT Right Eval Left Eval  Hip flexion 5 5  Hip extension    Hip abduction    Hip adduction    Hip internal rotation    Hip external rotation    Knee flexion 5 4+  Knee  extension 5 5  Ankle dorsiflexion 4+ 5  Ankle plantarflexion    Ankle inversion    Ankle eversion    (Blank rows = not tested)  BED MOBILITY:  Pt reports no difficulty   TRANSFERS: Assistive device utilized: None  Sit to stand: SBA Stand to sit: SBA  Pt reports initially feeling off balance when standing up   Pt reports difficulty getting up from the floor    GAIT: Gait pattern: step through pattern, decreased arm swing- Right, decreased arm swing- Left, decreased stride length, Right foot flat, Left foot flat, shuffling, decreased trunk rotation, and trunk flexed Distance walked: Clinic distances Assistive device utilized: None Level of assistance: SBA Comments: Pt reports difficulty with side stepping or turning in small spaces   FUNCTIONAL TESTS:  5 times sit to stand: 12.1 seconds with no UE support, decr anterior weight shift during 4th and 5th rep 10 meter walk test: 12.97 seconds = 2.53 ft/sec   VITALS  Vitals:   11/05/22 1015  BP: 139/64  Pulse: 71     TODAY'S TREATMENT:  NMR:  Pt performs PWR! Moves in Standing position, had chair in front of pt during clinic and educated to also have one at home as needed for safety.    PWR! Up for improved posture x20 reps   PWR! Rock for improved weight shifting x20 reps, cues to stand tall through stance leg and looking up at hands   PWR! Twist for improved trunk rotation x20 reps, cues to reset in the middle each time with tall posture before twisting to other side   PWR! Step for improved step initiation x20 reps, cues for foot clearance and looking at hands   Cues provided for technique, larger movements, and relation to function Pt reporting 6/10 RPE   Holding 6# medicine ball Alternating lateral step and weight shift and tossing rebounder ball to floor and catching it x10 reps each side, initial cues for proper weight shifting and tossing with enough force to catch it Alternating forward step and trunk  rotation to R/L  when tossing and catching ball, x10 reps each side  CGA/min A for balance, intermittent cues to slow down and focus on taking a deliberate step before tossing ball. Pt initially with difficulty coordinating movement   Ther Ex  SciFit Multi-peaks with BUE/BLE at Gear 3.5 > 4.5 for 8 minutes for larger amplitude movements, neural priming, incr amplitude of stepping, and reciprocal movement patterns.   PATIENT EDUCATION: Education details: Standing PWR moves to HEP and purpose of each exercise in regards to function  Person educated: Patient and Spouse Education method: Explanation Education comprehension: verbalized understanding  HOME EXERCISE PROGRAM: Standing PWR Moves  Access Code: [email or web address] URL: [email or web address] Date: 11/01/2022 Prepared by: Alethia Berthold Plaster  Exercises - Standing on pillows/dog bed with eyes closed in corner   - 1 x daily - 7 x weekly - 3 sets - 30-45 second hold - Sit to Stand Without Arm Support  - 1 x daily - 7 x weekly - 3 sets - 10 reps  GOALS: Goals reviewed with patient? Yes  SHORT TERM GOALS: Target date: 11/19/2022  Pt will be independent with initial HEP for PD in order to build upon functional gains made in therapy.  Baseline: Goal status: INITIAL  2.  miniBEST to be assessed with LTG written.  Baseline: 18/28 Goal status: MET  3.  Pt and pt's spouse will verbalize understanding of fall prevention in the home.  Baseline:  Goal status: INITIAL  4.  Pt will improve gait speed with LRAD vs. No AD to at least 2.8 ft/sec in order to demo improved community mobility.  Baseline: 12.97 seconds = 2.53 ft/sec Goal status: INITIAL   LONG TERM GOALS: Target date: 12/17/2022  Pt will be independent with final HEP for PD in order to build upon functional gains made in therapy. Baseline:  Goal status: INITIAL  2.  Pt will improve gait speed with LRAD vs. No AD to at least 3.1 ft/sec in order to demo improved  community mobility. Baseline:  Goal status: INITIAL  3.  Pt will improve MiniBest to 22/28 for decreased fall risk and improvement with compensatory stepping strategies.   Baseline: 18/28 Goal status: REVISED  4.  Pt will be able to get on and off the floor with supervision for improved fall recovery.  Baseline:  Goal status: INITIAL  5. Pt will verbalize understanding of local Parkinson's disease resources, including options for continue community fitness.  Baseline:  Goal status: INITIAL   ASSESSMENT:  CLINICAL IMPRESSION: Today's skilled session focused on adding to HEP for standing PWR moves, with pt able to tolerate well and rating RPE as 6/10 RPE for larger amplitude movements. Pt initially with difficulty coordinating movements, esp with arm movements, but improved with incr reps. Remainder of session focused on SciFit for reciprocal movements and stepping strategies in forward/lateral direction with use of medicine ball for anticipatory balance. CGA/min A as needed with intermittent cues for slowing down. Will continue per POC.    OBJECTIVE IMPAIRMENTS: Abnormal gait, decreased activity tolerance, decreased balance, decreased coordination, decreased knowledge of condition, decreased knowledge of use of DME, difficulty walking, decreased strength, impaired flexibility, and postural dysfunction.   ACTIVITY LIMITATIONS: bending, stairs, transfers, and locomotion level  PARTICIPATION LIMITATIONS: community activity  PERSONAL FACTORS: Age, Behavior pattern, Past/current experiences, Time since onset of injury/illness/exacerbation, and 3+ comorbidities: Parkinson's disease with mouth tremor, shuffling gait, imbalance, hypophonia, dysphagia, decreased arm swing left side, decreased sense of smell, onset February 2022, Depression, History of TIA in Feb 2022, Lumbar  degenerative disc disease, A fib, HLD, HTN  are also affecting patient's functional outcome.   REHAB POTENTIAL:  Good  CLINICAL DECISION MAKING: Evolving/moderate complexity  EVALUATION COMPLEXITY: Moderate  PLAN:  PT FREQUENCY: 2x/week  PT DURATION: 8 weeks  PLANNED INTERVENTIONS: 97164- PT Re-evaluation, 97110-Therapeutic exercises, 97530- Therapeutic activity, 97112- Neuromuscular re-education, 97535- Self Care, 64403- Manual therapy, 47425- Gait training, Balance training, Stair training, and DME instructions  PLAN FOR NEXT SESSION: review standing PWR moves as needed. Work on balance tasks, and gait with arm swing. Maybe try rollator or appropriate AD to decr risk of future falls. Lateral weight shift to R side, stepping strategies    Drake Leach, PT, DPT 11/05/2022, 10:57 AM

## 2022-11-08 ENCOUNTER — Ambulatory Visit: Payer: Medicare Other | Admitting: Physical Therapy

## 2022-11-08 VITALS — BP 128/77 | HR 82

## 2022-11-08 DIAGNOSIS — M6281 Muscle weakness (generalized): Secondary | ICD-10-CM

## 2022-11-08 DIAGNOSIS — R2681 Unsteadiness on feet: Secondary | ICD-10-CM

## 2022-11-08 DIAGNOSIS — R293 Abnormal posture: Secondary | ICD-10-CM

## 2022-11-08 DIAGNOSIS — R2689 Other abnormalities of gait and mobility: Secondary | ICD-10-CM | POA: Diagnosis not present

## 2022-11-08 NOTE — Therapy (Signed)
OUTPATIENT PHYSICAL THERAPY NEURO TREATMENT   Patient Name: Alyn Jurney MRN: [id number] DOB:[birth year], 82 y.o., male Today's Date: 11/08/2022   PCP: Laurann Montana, MD   REFERRING PROVIDER: Janice Coffin, PA-C  END OF SESSION:  PT End of Session - 11/08/22 1450     Visit Number 5    Number of Visits 13    Date for PT Re-Evaluation 12/21/22    Authorization Type Medicare - Part A & B    PT Start Time 1449    PT Stop Time 1531    PT Time Calculation (min) 42 min    Equipment Utilized During Treatment Gait belt    Activity Tolerance Patient tolerated treatment well    Behavior During Therapy WFL for tasks assessed/performed                Past Medical History:  Diagnosis Date   Atrial fibrillation (HCC)    Depression    History of melanoma    Hyperlipidemia    Hypertension    Peyronie disease    Past Surgical History:  Procedure Laterality Date   KNEE SURGERY Left    meniscal tear   MELANOMA EXCISION     Thumb replacement Left    TONSILLECTOMY     Patient Active Problem List   Diagnosis Date Noted   Rectal trauma 03/01/2020   HLD (hyperlipidemia) 03/01/2020   TIA (transient ischemic attack) 02/14/2020   Gait instability 02/14/2020   Alcohol use 02/14/2020   Depression 02/14/2020   GERD (gastroesophageal reflux disease) 02/14/2020   AF (paroxysmal atrial fibrillation) (HCC) 06/11/2014    ONSET DATE: 09/10/2022  REFERRING DIAG: G20.A1 (ICD-10-CM) - Parkinson's disease without dyskinesia, without mention of fluctuations  THERAPY DIAG:  Other abnormalities of gait and mobility  Muscle weakness (generalized)  Unsteadiness on feet  Abnormal posture  Rationale for Evaluation and Treatment: Rehabilitation  SUBJECTIVE:                                                                                                                                                                                             SUBJECTIVE STATEMENT: GOES BY  [name]  Pt reports doing well. Has memorized his HEP. Having some knee pain today. Denies falls or acute changes   Pt accompanied by:  Self  PERTINENT HISTORY: PMH: Parkinson's disease with mouth tremor, shuffling gait, imbalance, hypophonia, dysphagia, decreased arm swing left side, decreased sense of smell, onset February 2022, Depression, History of TIA in Feb 2022, Lumbar degenerative disc disease, A fib, HLD, HTN  Patient has discontinued the Carbidopa-Levodopa due to excessive fatigue.  07/22/2022 Syn-one biopsy results (lab  receipt date 06/29/2022 ; lab report date 07/15/2022  Synucleinopathy: There is no pathologic evidence of phosphorylated alpha-synuclein deposition within cutaneous  nerves. The absence of alpha-synuclein does not exclude a diagnosis of synucleinopathy.   PAIN:  Are you having pain? Yes: NPRS scale: 2/10 Pain location: L knee Pain description: achy  PRECAUTIONS: Fall  FALLS: Has patient fallen in last 6 months? Yes. Number of falls >10 falls  LIVING ENVIRONMENT: Lives with: lives with their spouse Lives in: House/apartment Stairs: Yes: Internal: 14 steps; can reach both and External: 3 steps; none and looking into getting these railings Has following equipment at home: Walker - 2 wheeled, Grab bars, and has a walking stick he made out of wood  PLOF: Independent and Leisure: Programmer, multimedia, used to like hiking   PATIENT GOALS: Wants to learn to move bigger, take longer steps   OBJECTIVE:  Note: Objective measures were completed at Evaluation unless otherwise noted.   COGNITION: Overall cognitive status: Within functional limits for tasks assessed   SENSATION: WFL  COORDINATION: Heel to shin: slower to perform LLE    POSTURE: rounded shoulders and forward head  LOWER EXTREMITY ROM:     Limited knee extension LLE>RLE  LOWER EXTREMITY MMT:    MMT Right Eval Left Eval  Hip flexion 5 5  Hip extension    Hip abduction    Hip adduction     Hip internal rotation    Hip external rotation    Knee flexion 5 4+  Knee extension 5 5  Ankle dorsiflexion 4+ 5  Ankle plantarflexion    Ankle inversion    Ankle eversion    (Blank rows = not tested)  BED MOBILITY:  Pt reports no difficulty   TRANSFERS: Assistive device utilized: None  Sit to stand: SBA Stand to sit: SBA  Pt reports initially feeling off balance when standing up   Pt reports difficulty getting up from the floor    GAIT: Gait pattern: step through pattern, decreased arm swing- Right, decreased arm swing- Left, decreased stride length, Right foot flat, Left foot flat, shuffling, decreased trunk rotation, and trunk flexed Distance walked: Clinic distances Assistive device utilized: None Level of assistance: SBA Comments: Pt reports difficulty with side stepping or turning in small spaces   FUNCTIONAL TESTS:  5 times sit to stand: 12.1 seconds with no UE support, decr anterior weight shift during 4th and 5th rep 10 meter walk test: 12.97 seconds = 2.53 ft/sec   VITALS  Vitals:   11/08/22 1452  BP: 128/77  Pulse: 82      TODAY'S TREATMENT:  Ther Act  Assessed vitals (see above) and WNL for therapy   Ther Ex  SciFit multi-peaks level 8 for 8 minutes using BUE/BLEs for neural priming for reciprocal movement, dynamic cardiovascular warmup and increased amplitude of stepping. RPE of 3/10 following activity   NMR  In // bars for improved lateral weight shifting, stepping strategy, postural control and BLE strength:  On rocker board in L/R direction:  Standing w/o UE support x3 minutes w/min A x1 for posterolateral LOB correction to R side. Pt w/absent hip/ankle strategy noted and had delayed righting reactions when losing balance posteriorly. Cued pt to use mirror for visual biofeedback on body position, but pt reports the mirror throws him off and he has never been able to look at himself in the mirror and shave due to inability to locate his face with  his hands if looking in mirror. Noted pt leaning backward  to step off board and triggered freezing episode, requiring max A to prevent fall.  On rocker board in A/P direction:  Standing w/no UE support x1 minute w/mod A for posterior LOB correction. Pt required max multimodal cues to facilitate anterior weight shift, most successful w/verbal cues to "press the gas pedal"  Alt retro step off board w/o UE support, x10 per side w/ min-mod A for steadying assist. Pt unable to facilitate alt sequence without concurrent verbal cues from therapist. Noted pt leaning posteriorly w/each step. Decreased step clearance of RLE > LLE  PWR Up (supplied by [facility] certified therapist) x8 reps. Mod concurrent cues provided for proper technique, open hands and midline orientation throughout. Mod A required for safety.  6 Blaze pods on random setting for improved LE coordination, midline orientation and single leg stability.  Performed on 2 minute intervals with 1 minute rest periods.  Pt requires min-mod A guarding. Round 1:  on airex w/3 pods placed anteriorly and 3 posteriorly. Cued pt to use mirror to visualize pods behind him and to lean forward w/trunk when tapping posteriorly and posterior w/trunk when tapping anteriorly to maintain balance. 26 hits. Round 2:  Same setup.  22 hits. Pt maintained narrow BOS throughout and required mod-max A due to posterior LOB     PATIENT EDUCATION: Education details: Continue HEP Person educated: Patient and Spouse Education method: Explanation Education comprehension: verbalized understanding  HOME EXERCISE PROGRAM: Standing PWR Moves  Access Code: [email or web address] URL: [email or web address] Date: 11/01/2022 Prepared by: Alethia Berthold Allex Madia  Exercises - Standing on pillows/dog bed with eyes closed in corner   - 1 x daily - 7 x weekly - 3 sets - 30-45 second hold - Sit to Stand Without Arm Support  - 1 x daily - 7 x weekly - 3 sets - 10 reps  GOALS: Goals reviewed  with patient? Yes  SHORT TERM GOALS: Target date: 11/19/2022  Pt will be independent with initial HEP for PD in order to build upon functional gains made in therapy.  Baseline: Goal status: INITIAL  2.  miniBEST to be assessed with LTG written.  Baseline: 18/28 Goal status: MET  3.  Pt and pt's spouse will verbalize understanding of fall prevention in the home.  Baseline:  Goal status: INITIAL  4.  Pt will improve gait speed with LRAD vs. No AD to at least 2.8 ft/sec in order to demo improved community mobility.  Baseline: 12.97 seconds = 2.53 ft/sec Goal status: INITIAL   LONG TERM GOALS: Target date: 12/17/2022  Pt will be independent with final HEP for PD in order to build upon functional gains made in therapy. Baseline:  Goal status: INITIAL  2.  Pt will improve gait speed with LRAD vs. No AD to at least 3.1 ft/sec in order to demo improved community mobility. Baseline:  Goal status: INITIAL  3.  Pt will improve MiniBest to 22/28 for decreased fall risk and improvement with compensatory stepping strategies.   Baseline: 18/28 Goal status: REVISED  4.  Pt will be able to get on and off the floor with supervision for improved fall recovery.  Baseline:  Goal status: INITIAL  5. Pt will verbalize understanding of local Parkinson's disease resources, including options for continue community fitness.  Baseline:  Goal status: INITIAL   ASSESSMENT:  CLINICAL IMPRESSION: Emphasis of skilled PT session on postural control, lateral weight shifting, midline orientation and stepping strategies. Pt very challenged by session today, requiring mod-max A to prevent fall in posterior direction  w/any activity in front of mirror. Pt reports he has never been able to locate his own body in a mirror, as the image is flipped in his head. Pt demonstrates posterior lean preference as well as delayed righting reactions when losing balance in posterior or right lateral directions. Will  continue per POC.    OBJECTIVE IMPAIRMENTS: Abnormal gait, decreased activity tolerance, decreased balance, decreased coordination, decreased knowledge of condition, decreased knowledge of use of DME, difficulty walking, decreased strength, impaired flexibility, and postural dysfunction.   ACTIVITY LIMITATIONS: bending, stairs, transfers, and locomotion level  PARTICIPATION LIMITATIONS: community activity  PERSONAL FACTORS: Age, Behavior pattern, Past/current experiences, Time since onset of injury/illness/exacerbation, and 3+ comorbidities: Parkinson's disease with mouth tremor, shuffling gait, imbalance, hypophonia, dysphagia, decreased arm swing left side, decreased sense of smell, onset February 2022, Depression, History of TIA in Feb 2022, Lumbar degenerative disc disease, A fib, HLD, HTN  are also affecting patient's functional outcome.   REHAB POTENTIAL: Good  CLINICAL DECISION MAKING: Evolving/moderate complexity  EVALUATION COMPLEXITY: Moderate  PLAN:  PT FREQUENCY: 2x/week  PT DURATION: 8 weeks  PLANNED INTERVENTIONS: 97164- PT Re-evaluation, 97110-Therapeutic exercises, 97530- Therapeutic activity, 97112- Neuromuscular re-education, 97535- Self Care, 78295- Manual therapy, 62130- Gait training, Balance training, Stair training, and DME instructions  PLAN FOR NEXT SESSION: review standing PWR moves as needed. Work on balance tasks, and gait with arm swing. Maybe try rollator or appropriate AD to decr risk of future falls. Lateral weight shift to R side, stepping strategies, postural control, blaze pods on mirror?   Jill Alexanders Timoth Schara, PT, DPT 11/08/2022, 3:32 PM

## 2022-11-12 ENCOUNTER — Ambulatory Visit: Payer: Medicare Other | Admitting: Physical Therapy

## 2022-11-12 VITALS — BP 147/75 | HR 73

## 2022-11-12 DIAGNOSIS — R2689 Other abnormalities of gait and mobility: Secondary | ICD-10-CM

## 2022-11-12 NOTE — Therapy (Signed)
OUTPATIENT PHYSICAL THERAPY NEURO TREATMENT- ARRIVED NO CHARGE   Patient Name: Jose Fuentes MRN: [id number] DOB:04/24/1940, 82 y.o., male Today's Date: 11/12/2022   PCP: Laurann Montana, MD   REFERRING PROVIDER: Janice Coffin, PA-C  END OF SESSION:  PT End of Session - 11/12/22 1108     Visit Number 5   Arrived no charge   Number of Visits 13    Date for PT Re-Evaluation 12/21/22    Authorization Type Medicare - Part A & B    PT Start Time 1104    PT Stop Time 1110   Arrived no charge   PT Time Calculation (min) 6 min    Equipment Utilized During Treatment --    Activity Tolerance Other (comment)   Pt reported being very dizzy and not feeling well for therapy   Behavior During Therapy [facility] for tasks assessed/performed                 Past Medical History:  Diagnosis Date   Atrial fibrillation (HCC)    Depression    History of melanoma    Hyperlipidemia    Hypertension    Peyronie disease    Past Surgical History:  Procedure Laterality Date   KNEE SURGERY Left    meniscal tear   MELANOMA EXCISION     Thumb replacement Left    TONSILLECTOMY     Patient Active Problem List   Diagnosis Date Noted   Rectal trauma 03/01/2020   HLD (hyperlipidemia) 03/01/2020   TIA (transient ischemic attack) 02/14/2020   Gait instability 02/14/2020   Alcohol use 02/14/2020   Depression 02/14/2020   GERD (gastroesophageal reflux disease) 02/14/2020   AF (paroxysmal atrial fibrillation) (HCC) 06/11/2014    ONSET DATE: 09/10/2022  REFERRING DIAG: G20.A1 (ICD-10-CM) - Parkinson's disease without dyskinesia, without mention of fluctuations  THERAPY DIAG:  Other abnormalities of gait and mobility  Rationale for Evaluation and Treatment: Rehabilitation  SUBJECTIVE:                                                                                                                                                                                             SUBJECTIVE  STATEMENT: GOES BY [name]  Pt reports being very dizzy today and "off". Not sure he will do well w/therapy. Denies pain, rating dizziness as a 5/10   Pt accompanied by:  Self  PERTINENT HISTORY: PMH: Parkinson's disease with mouth tremor, shuffling gait, imbalance, hypophonia, dysphagia, decreased arm swing left side, decreased sense of smell, onset February 2022, Depression, History of TIA in Feb 2022, Lumbar degenerative disc disease, A fib, HLD, HTN  Patient  has discontinued the Carbidopa-Levodopa due to excessive fatigue.  07/22/2022 Syn-one biopsy results (lab receipt date 06/29/2022 ; lab report date 07/15/2022  Synucleinopathy: There is no pathologic evidence of phosphorylated alpha-synuclein deposition within cutaneous  nerves. The absence of alpha-synuclein does not exclude a diagnosis of synucleinopathy.   PAIN:  Are you having pain? Yes: NPRS scale: 2/10 Pain location: L knee Pain description: achy  PRECAUTIONS: Fall  FALLS: Has patient fallen in last 6 months? Yes. Number of falls >10 falls  LIVING ENVIRONMENT: Lives with: lives with their spouse Lives in: House/apartment Stairs: Yes: Internal: 14 steps; can reach both and External: 3 steps; none and looking into getting these railings Has following equipment at home: Walker - 2 wheeled, Grab bars, and has a walking stick he made out of wood  PLOF: Independent and Leisure: Programmer, multimedia, used to like hiking   PATIENT GOALS: Wants to learn to move bigger, take longer steps   OBJECTIVE:  Note: Objective measures were completed at Evaluation unless otherwise noted.   COGNITION: Overall cognitive status: Within functional limits for tasks assessed   SENSATION: WFL  COORDINATION: Heel to shin: slower to perform LLE    POSTURE: rounded shoulders and forward head  LOWER EXTREMITY ROM:     Limited knee extension LLE>RLE  LOWER EXTREMITY MMT:    MMT Right Eval Left Eval  Hip flexion 5 5  Hip extension     Hip abduction    Hip adduction    Hip internal rotation    Hip external rotation    Knee flexion 5 4+  Knee extension 5 5  Ankle dorsiflexion 4+ 5  Ankle plantarflexion    Ankle inversion    Ankle eversion    (Blank rows = not tested)  BED MOBILITY:  Pt reports no difficulty   TRANSFERS: Assistive device utilized: None  Sit to stand: SBA Stand to sit: SBA  Pt reports initially feeling off balance when standing up   Pt reports difficulty getting up from the floor    GAIT: Gait pattern: step through pattern, decreased arm swing- Right, decreased arm swing- Left, decreased stride length, Right foot flat, Left foot flat, shuffling, decreased trunk rotation, and trunk flexed Distance walked: Clinic distances Assistive device utilized: None Level of assistance: SBA Comments: Pt reports difficulty with side stepping or turning in small spaces   FUNCTIONAL TESTS:  5 times sit to stand: 12.1 seconds with no UE support, decr anterior weight shift during 4th and 5th rep 10 meter walk test: 12.97 seconds = 2.53 ft/sec   VITALS  Vitals:   11/12/22 1108  BP: (!) 147/75  Pulse: 73       TODAY'S TREATMENT:  Ther Act  Assessed vitals (see above) and WNL. However, pt in agreement to reschedule PT today as he drove himself to appointment and is not feeling well. Pt declined calling a friend or family member to come pick him up as he states he will be okay to drive home.  Added single appointment at end of POC to make up for today's session.      PATIENT EDUCATION: Education details: Continue HEP, next appointment day and time  Person educated: Patient and Spouse Education method: Explanation Education comprehension: verbalized understanding  HOME EXERCISE PROGRAM: Standing PWR Moves  Access Code: [email or web address] URL: [email or web address] Date: 11/01/2022 Prepared by: Alethia Berthold Mert Dietrick  Exercises - Standing on pillows/dog bed with eyes closed in corner   - 1 x  daily - 7 x weekly - 3  sets - 30-45 second hold - Sit to Stand Without Arm Support  - 1 x daily - 7 x weekly - 3 sets - 10 reps  GOALS: Goals reviewed with patient? Yes  SHORT TERM GOALS: Target date: 11/19/2022  Pt will be independent with initial HEP for PD in order to build upon functional gains made in therapy.  Baseline: Goal status: INITIAL  2.  miniBEST to be assessed with LTG written.  Baseline: 18/28 Goal status: MET  3.  Pt and pt's spouse will verbalize understanding of fall prevention in the home.  Baseline:  Goal status: INITIAL  4.  Pt will improve gait speed with LRAD vs. No AD to at least 2.8 ft/sec in order to demo improved community mobility.  Baseline: 12.97 seconds = 2.53 ft/sec Goal status: INITIAL   LONG TERM GOALS: Target date: 12/17/2022  Pt will be independent with final HEP for PD in order to build upon functional gains made in therapy. Baseline:  Goal status: INITIAL  2.  Pt will improve gait speed with LRAD vs. No AD to at least 3.1 ft/sec in order to demo improved community mobility. Baseline:  Goal status: INITIAL  3.  Pt will improve MiniBest to 22/28 for decreased fall risk and improvement with compensatory stepping strategies.   Baseline: 18/28 Goal status: REVISED  4.  Pt will be able to get on and off the floor with supervision for improved fall recovery.  Baseline:  Goal status: INITIAL  5. Pt will verbalize understanding of local Parkinson's disease resources, including options for continue community fitness.  Baseline:  Goal status: INITIAL   ASSESSMENT:  CLINICAL IMPRESSION: Arrived no charge due to pt dizziness and general malaise.    OBJECTIVE IMPAIRMENTS: Abnormal gait, decreased activity tolerance, decreased balance, decreased coordination, decreased knowledge of condition, decreased knowledge of use of DME, difficulty walking, decreased strength, impaired flexibility, and postural dysfunction.   ACTIVITY  LIMITATIONS: bending, stairs, transfers, and locomotion level  PARTICIPATION LIMITATIONS: community activity  PERSONAL FACTORS: Age, Behavior pattern, Past/current experiences, Time since onset of injury/illness/exacerbation, and 3+ comorbidities: Parkinson's disease with mouth tremor, shuffling gait, imbalance, hypophonia, dysphagia, decreased arm swing left side, decreased sense of smell, onset February 2022, Depression, History of TIA in Feb 2022, Lumbar degenerative disc disease, A fib, HLD, HTN  are also affecting patient's functional outcome.   REHAB POTENTIAL: Good  CLINICAL DECISION MAKING: Evolving/moderate complexity  EVALUATION COMPLEXITY: Moderate  PLAN:  PT FREQUENCY: 2x/week  PT DURATION: 8 weeks  PLANNED INTERVENTIONS: 97164- PT Re-evaluation, 97110-Therapeutic exercises, 97530- Therapeutic activity, 97112- Neuromuscular re-education, 97535- Self Care, 84696- Manual therapy, [phone]- Gait training, Balance training, Stair training, and DME instructions  PLAN FOR NEXT SESSION: Check BP. review standing PWR moves as needed. Work on balance tasks, and gait with arm swing. Maybe try rollator or appropriate AD to decr risk of future falls. Lateral weight shift to R side, stepping strategies, postural control, blaze pods on mirror?   Jill Alexanders Kylieann Eagles, PT, DPT 11/12/2022, 11:17 AM

## 2022-11-15 ENCOUNTER — Ambulatory Visit: Payer: Medicare Other | Admitting: Physical Therapy

## 2022-11-15 VITALS — BP 131/68 | HR 74

## 2022-11-15 DIAGNOSIS — R2681 Unsteadiness on feet: Secondary | ICD-10-CM | POA: Diagnosis not present

## 2022-11-15 DIAGNOSIS — R293 Abnormal posture: Secondary | ICD-10-CM | POA: Diagnosis not present

## 2022-11-15 DIAGNOSIS — M6281 Muscle weakness (generalized): Secondary | ICD-10-CM | POA: Diagnosis not present

## 2022-11-15 DIAGNOSIS — R2689 Other abnormalities of gait and mobility: Secondary | ICD-10-CM

## 2022-11-15 NOTE — Therapy (Signed)
OUTPATIENT PHYSICAL THERAPY NEURO TREATMENT   Patient Name: Jose Fuentes MRN: [id number] DOB:06/26/1940, 82 y.o., male Today's Date: 11/15/2022   PCP: Laurann Montana, MD   REFERRING PROVIDER: Janice Coffin, PA-C  END OF SESSION:  PT End of Session - 11/15/22 1103     Visit Number 6    Number of Visits 13    Date for PT Re-Evaluation 12/21/22    Authorization Type Medicare - Part A & B    PT Start Time 1101    PT Stop Time 1148    PT Time Calculation (min) 47 min    Equipment Utilized During Treatment Gait belt    Activity Tolerance Patient tolerated treatment well    Behavior During Therapy WFL for tasks assessed/performed                  Past Medical History:  Diagnosis Date   Atrial fibrillation (HCC)    Depression    History of melanoma    Hyperlipidemia    Hypertension    Peyronie disease    Past Surgical History:  Procedure Laterality Date   KNEE SURGERY Left    meniscal tear   MELANOMA EXCISION     Thumb replacement Left    TONSILLECTOMY     Patient Active Problem List   Diagnosis Date Noted   Rectal trauma 03/01/2020   HLD (hyperlipidemia) 03/01/2020   TIA (transient ischemic attack) 02/14/2020   Gait instability 02/14/2020   Alcohol use 02/14/2020   Depression 02/14/2020   GERD (gastroesophageal reflux disease) 02/14/2020   AF (paroxysmal atrial fibrillation) (HCC) 06/11/2014    ONSET DATE: 09/10/2022  REFERRING DIAG: G20.A1 (ICD-10-CM) - Parkinson's disease without dyskinesia, without mention of fluctuations  THERAPY DIAG:  Other abnormalities of gait and mobility  Muscle weakness (generalized)  Unsteadiness on feet  Rationale for Evaluation and Treatment: Rehabilitation  SUBJECTIVE:                                                                                                                                                                                             SUBJECTIVE STATEMENT: GOES BY Jose Fuentes  Pt reports  feeling a bit better today. Went home and took a nap and helped his dizziness a lot. Rating his dizziness as a 2/10 today. Is worried about his recent weight loss, is below his goal level and has lost ~10 lbs this year w/no changes to his diet.   Pt accompanied by:  Self  PERTINENT HISTORY: PMH: Parkinson's disease with mouth tremor, shuffling gait, imbalance, hypophonia, dysphagia, decreased arm swing left side, decreased sense of smell, onset February 2022,  Depression, History of TIA in Feb 2022, Lumbar degenerative disc disease, A fib, HLD, HTN  Patient has discontinued the Carbidopa-Levodopa due to excessive fatigue.  07/22/2022 Syn-one biopsy results (lab receipt date 06/29/2022 ; lab report date 07/15/2022  Synucleinopathy: There is no pathologic evidence of phosphorylated alpha-synuclein deposition within cutaneous  nerves. The absence of alpha-synuclein does not exclude a diagnosis of synucleinopathy.   PAIN:  Are you having pain? No  PRECAUTIONS: Fall  FALLS: Has patient fallen in last 6 months? Yes. Number of falls >10 falls  LIVING ENVIRONMENT: Lives with: lives with their spouse Lives in: House/apartment Stairs: Yes: Internal: 14 steps; can reach both and External: 3 steps; none and looking into getting these railings Has following equipment at home: Walker - 2 wheeled, Grab bars, and has a walking stick he made out of wood  PLOF: Independent and Leisure: Programmer, multimedia, used to like hiking   PATIENT GOALS: Wants to learn to move bigger, take longer steps   OBJECTIVE:  Note: Objective measures were completed at Evaluation unless otherwise noted.   COGNITION: Overall cognitive status: Within functional limits for tasks assessed   SENSATION: WFL  COORDINATION: Heel to shin: slower to perform LLE    POSTURE: rounded shoulders and forward head  LOWER EXTREMITY ROM:     Limited knee extension LLE>RLE  LOWER EXTREMITY MMT:    MMT Right Eval Left Eval  Hip  flexion 5 5  Hip extension    Hip abduction    Hip adduction    Hip internal rotation    Hip external rotation    Knee flexion 5 4+  Knee extension 5 5  Ankle dorsiflexion 4+ 5  Ankle plantarflexion    Ankle inversion    Ankle eversion    (Blank rows = not tested)  BED MOBILITY:  Pt reports no difficulty   TRANSFERS: Assistive device utilized: None  Sit to stand: SBA Stand to sit: SBA  Pt reports initially feeling off balance when standing up   Pt reports difficulty getting up from the floor    GAIT: Gait pattern: step through pattern, decreased arm swing- Right, decreased arm swing- Left, decreased stride length, Right foot flat, Left foot flat, shuffling, decreased trunk rotation, and trunk flexed Distance walked: Clinic distances Assistive device utilized: None Level of assistance: SBA Comments: Pt reports difficulty with side stepping or turning in small spaces   FUNCTIONAL TESTS:  5 times sit to stand: 12.1 seconds with no UE support, decr anterior weight shift during 4th and 5th rep 10 meter walk test: 12.97 seconds = 2.53 ft/sec   VITALS  Vitals:   11/15/22 1117  BP: 131/68  Pulse: 74     TODAY'S TREATMENT:  Ther Act  Discussed importance of adding protein to diet to maintain muscle mass. Pt reports he is going to start drinking Ensure to boost his protein levels. Encouraged pt to reach out to PCP in ~1 month if he continues to lose weight while drinking Ensure, pt verbalized understanding.   Assessed vitals (see above) and WNL for therapy  Discussed challenges w/vision, as pt reports he cannot visualize his body in a mirror, as in the image is reversed. Pt reports he has been experiencing this since he was a child and thought this was normal.    Ther Ex  SciFit multi-peaks level 8 for 8 minutes using BUE/BLEs for neural priming for reciprocal movement, dynamic cardiovascular warmup and increased amplitude of stepping. RPE of 5/10 following activity  NMR  In // bars for improved stepping strategies, increased step length/clearance, LE coordination and postural control:  Alt retro step off foam x8 reps per side w/min A for posterior LOB. Min cues to slow down and ensure his feet are fully on the foam prior to stepping, as pt frequently catching toes on foam pad and standing w/his heels off foam pad. Progressed to adding BUE abduction w/each retro step for improved postural control, but pt w/significant difficulty coordinating arms and stepping, requiring min A for posterolateral LOB to R side. Noted increased difficulty standing on LLE due to decreased strength.  Alt fwd advance/retreat over 6" hurdle while standing on foam pad, x8 reps per side. Pt frequently kicking hurdle over w/RLE and required min A to provided steadying assist due to posterior LOB. Pt reported significant difficulty w/task and again required cues to slow down and stabilize prior to stepping.    PATIENT EDUCATION: Education details: Continue HEP, next appointment day and time  Person educated: Patient Education method: Explanation Education comprehension: verbalized understanding  HOME EXERCISE PROGRAM: Standing PWR Moves  Access Code: [email or web address] URL: [email or web address] Date: 11/01/2022 Prepared by: Alethia Berthold Romari Gasparro  Exercises - Standing on pillows/dog bed with eyes closed in corner   - 1 x daily - 7 x weekly - 3 sets - 30-45 second hold - Sit to Stand Without Arm Support  - 1 x daily - 7 x weekly - 3 sets - 10 reps  GOALS: Goals reviewed with patient? Yes  SHORT TERM GOALS: Target date: 11/19/2022  Pt will be independent with initial HEP for PD in order to build upon functional gains made in therapy.  Baseline: Goal status: INITIAL  2.  miniBEST to be assessed with LTG written.  Baseline: 18/28 Goal status: MET  3.  Pt and pt's spouse will verbalize understanding of fall prevention in the home.  Baseline:  Goal status: INITIAL  4.   Pt will improve gait speed with LRAD vs. No AD to at least 2.8 ft/sec in order to demo improved community mobility.  Baseline: 12.97 seconds = 2.53 ft/sec Goal status: INITIAL   LONG TERM GOALS: Target date: 12/17/2022  Pt will be independent with final HEP for PD in order to build upon functional gains made in therapy. Baseline:  Goal status: INITIAL  2.  Pt will improve gait speed with LRAD vs. No AD to at least 3.1 ft/sec in order to demo improved community mobility. Baseline:  Goal status: INITIAL  3.  Pt will improve MiniBest to 22/28 for decreased fall risk and improvement with compensatory stepping strategies.   Baseline: 18/28 Goal status: REVISED  4.  Pt will be able to get on and off the floor with supervision for improved fall recovery.  Baseline:  Goal status: INITIAL  5. Pt will verbalize understanding of local Parkinson's disease resources, including options for continue community fitness.  Baseline:  Goal status: INITIAL   ASSESSMENT:  CLINICAL IMPRESSION: Emphasis of skilled PT session on pt education, increased step length/clearance and postural control. Pt reports he is concerned w/his recent weight loss but is going to increase his protein intake. Pt states he has significant difficulty w/mirrors, as the images are reversed in his head. Noted that pt able to elicit righting reactions and stepping strategies if not looking in mirror, compared to previous session in which pt had absent reactions. Continue POC.    OBJECTIVE IMPAIRMENTS: Abnormal gait, decreased activity tolerance, decreased balance, decreased coordination, decreased knowledge of condition, decreased knowledge  of use of DME, difficulty walking, decreased strength, impaired flexibility, and postural dysfunction.   ACTIVITY LIMITATIONS: bending, stairs, transfers, and locomotion level  PARTICIPATION LIMITATIONS: community activity  PERSONAL FACTORS: Age, Behavior pattern, Past/current  experiences, Time since onset of injury/illness/exacerbation, and 3+ comorbidities: Parkinson's disease with mouth tremor, shuffling gait, imbalance, hypophonia, dysphagia, decreased arm swing left side, decreased sense of smell, onset February 2022, Depression, History of TIA in Feb 2022, Lumbar degenerative disc disease, A fib, HLD, HTN  are also affecting patient's functional outcome.   REHAB POTENTIAL: Good  CLINICAL DECISION MAKING: Evolving/moderate complexity  EVALUATION COMPLEXITY: Moderate  PLAN:  PT FREQUENCY: 2x/week  PT DURATION: 8 weeks  PLANNED INTERVENTIONS: 97164- PT Re-evaluation, 97110-Therapeutic exercises, 97530- Therapeutic activity, 97112- Neuromuscular re-education, 97535- Self Care, 16109- Manual therapy, 60454- Gait training, Balance training, Stair training, and DME instructions  PLAN FOR NEXT SESSION: Goals. Check BP. review standing PWR moves as needed. Work on balance tasks, and gait with arm swing. Maybe try rollator or appropriate AD to decr risk of future falls. Lateral weight shift to R side, stepping strategies, postural control, blaze pods on mirror?   Jill Alexanders Jada Kuhnert, PT, DPT 11/15/2022, 11:49 AM

## 2022-11-16 DIAGNOSIS — F1011 Alcohol abuse, in remission: Secondary | ICD-10-CM | POA: Diagnosis not present

## 2022-11-16 DIAGNOSIS — F3341 Major depressive disorder, recurrent, in partial remission: Secondary | ICD-10-CM | POA: Diagnosis not present

## 2022-11-19 ENCOUNTER — Encounter: Payer: Self-pay | Admitting: Physical Therapy

## 2022-11-19 ENCOUNTER — Ambulatory Visit: Payer: Medicare Other | Admitting: Physical Therapy

## 2022-11-19 VITALS — BP 137/70 | HR 78

## 2022-11-19 DIAGNOSIS — R2681 Unsteadiness on feet: Secondary | ICD-10-CM | POA: Diagnosis not present

## 2022-11-19 DIAGNOSIS — M6281 Muscle weakness (generalized): Secondary | ICD-10-CM | POA: Diagnosis not present

## 2022-11-19 DIAGNOSIS — R2689 Other abnormalities of gait and mobility: Secondary | ICD-10-CM

## 2022-11-19 DIAGNOSIS — R293 Abnormal posture: Secondary | ICD-10-CM | POA: Diagnosis not present

## 2022-11-19 NOTE — Therapy (Signed)
OUTPATIENT PHYSICAL THERAPY NEURO TREATMENT   Patient Name: Jose Fuentes MRN: [id number] DOB:[birth year], 82 y.o., male Today's Date: 11/19/2022   PCP: Laurann Montana, MD   REFERRING PROVIDER: Janice Coffin, PA-C  END OF SESSION:  PT End of Session - 11/19/22 1233     Visit Number 7    Number of Visits 13    Date for PT Re-Evaluation 12/21/22    Authorization Type Medicare - Part A & B    PT Start Time 1232    Equipment Utilized During Treatment Gait belt    Activity Tolerance Patient tolerated treatment well    Behavior During Therapy WFL for tasks assessed/performed                  Past Medical History:  Diagnosis Date   Atrial fibrillation (HCC)    Depression    History of melanoma    Hyperlipidemia    Hypertension    Peyronie disease    Past Surgical History:  Procedure Laterality Date   KNEE SURGERY Left    meniscal tear   MELANOMA EXCISION     Thumb replacement Left    TONSILLECTOMY     Patient Active Problem List   Diagnosis Date Noted   Rectal trauma 03/01/2020   HLD (hyperlipidemia) 03/01/2020   TIA (transient ischemic attack) 02/14/2020   Gait instability 02/14/2020   Alcohol use 02/14/2020   Depression 02/14/2020   GERD (gastroesophageal reflux disease) 02/14/2020   AF (paroxysmal atrial fibrillation) (HCC) 06/11/2014    ONSET DATE: 09/10/2022  REFERRING DIAG: G20.A1 (ICD-10-CM) - Parkinson's disease without dyskinesia, without mention of fluctuations  THERAPY DIAG:  Muscle weakness (generalized)  Other abnormalities of gait and mobility  Abnormal posture  Unsteadiness on feet  Rationale for Evaluation and Treatment: Rehabilitation  SUBJECTIVE:                                                                                                                                                                                             SUBJECTIVE STATEMENT: GOES BY [name]  Had a fall since he was last here. Was bending over  to fix a hose for picking up leaves and just kept going. Reports he did not hurt himself and was able to get up on his own. Notes dizziness is fine today, not issues.   Pt accompanied by:  Self  PERTINENT HISTORY: PMH: Parkinson's disease with mouth tremor, shuffling gait, imbalance, hypophonia, dysphagia, decreased arm swing left side, decreased sense of smell, onset February 2022, Depression, History of TIA in Feb 2022, Lumbar degenerative disc disease, A fib, HLD, HTN  Patient has  discontinued the Carbidopa-Levodopa due to excessive fatigue.  07/22/2022 Syn-one biopsy results (lab receipt date 06/29/2022 ; lab report date 07/15/2022  Synucleinopathy: There is no pathologic evidence of phosphorylated alpha-synuclein deposition within cutaneous  nerves. The absence of alpha-synuclein does not exclude a diagnosis of synucleinopathy.   PAIN:  Are you having pain? No  PRECAUTIONS: Fall  FALLS: Has patient fallen in last 6 months? Yes. Number of falls >10 falls  LIVING ENVIRONMENT: Lives with: lives with their spouse Lives in: House/apartment Stairs: Yes: Internal: 14 steps; can reach both and External: 3 steps; none and looking into getting these railings Has following equipment at home: Walker - 2 wheeled, Grab bars, and has a walking stick he made out of wood  PLOF: Independent and Leisure: Programmer, multimedia, used to like hiking   PATIENT GOALS: Wants to learn to move bigger, take longer steps   OBJECTIVE:  Note: Objective measures were completed at Evaluation unless otherwise noted.   COGNITION: Overall cognitive status: Within functional limits for tasks assessed   SENSATION: WFL  COORDINATION: Heel to shin: slower to perform LLE    POSTURE: rounded shoulders and forward head  LOWER EXTREMITY ROM:     Limited knee extension LLE>RLE  LOWER EXTREMITY MMT:    MMT Right Eval Left Eval  Hip flexion 5 5  Hip extension    Hip abduction    Hip adduction    Hip  internal rotation    Hip external rotation    Knee flexion 5 4+  Knee extension 5 5  Ankle dorsiflexion 4+ 5  Ankle plantarflexion    Ankle inversion    Ankle eversion    (Blank rows = not tested)  BED MOBILITY:  Pt reports no difficulty   TRANSFERS: Assistive device utilized: None  Sit to stand: SBA Stand to sit: SBA  Pt reports initially feeling off balance when standing up   Pt reports difficulty getting up from the floor    GAIT: Gait pattern: step through pattern, decreased arm swing- Right, decreased arm swing- Left, decreased stride length, Right foot flat, Left foot flat, shuffling, decreased trunk rotation, and trunk flexed Distance walked: Clinic distances Assistive device utilized: None Level of assistance: SBA Comments: Pt reports difficulty with side stepping or turning in small spaces   FUNCTIONAL TESTS:  5 times sit to stand: 12.1 seconds with no UE support, decr anterior weight shift during 4th and 5th rep 10 meter walk test: 12.97 seconds = 2.53 ft/sec   VITALS  Vitals:   11/19/22 1237  BP: 137/70  Pulse: 78      TODAY'S TREATMENT:  Ther Act  Gait speed: 10.8 seconds = 3.1 ft/sec Assessed vitals (see above) and WNL for therapy  Went over proper technique for bending due to pt's recent fall  Pt initially performing with incr low back flexion and more narrow BOS Showed pt how to perform using his legs, having a wider BOS or with staggered stance, pt performed picking up cones, 5# and 10# kettlebells from the floor with proper technique and supervision. Pt demonstrating good balance and also reporting feeling more steady when bending this way. Also reviewed using a reacher as needed  Provided handout for fall prevention and reviewed with pt    NMR   Pt performs PWR! Moves in standing position on air ex at edge of mat table with chair in front of pt    PWR! Up for improved posture x10 reps, performed an additional 10 reps with pt  raising onto  heels   PWR! Rock for improved weight shifting x20 reps, cues to look up at hand, pt frequently losing balance forwards into chair   PWR! Twist for improved trunk rotation x10 reps   PWR! Step for improved step initiation x20 reps, intermittent UE support   Cues provided for technique   On air ex: Sit <> stands with 6# ball, slamming to floor and then catching it and sitting back down 10 reps, initial cues for sequencing  With 6 blaze pods on steps (3 on first step, 3 on 2nd step) for reaction times, weight shifting, SLS, visual scanning, performed alternating taps between R/L  Performed 1 bout of 1 minute: 35 hits Performed 2 bouts of 1 minute with cog dual tasking naming things from A-Z: 25 hits (naming foods), 22 hits (naming animals) Pt needing CGA/min A at times for balance, pt did better with balance when going more slowly    PATIENT EDUCATION: Education details: Continue HEP, see therapeutic activity section above.  Person educated: Patient Education method: Explanation, Demonstration, Verbal cues, and Handouts Education comprehension: verbalized understanding and returned demonstration  HOME EXERCISE PROGRAM: Standing PWR Moves  Access Code: [email or web address] URL: [email or web address] Date: 11/01/2022 Prepared by: Alethia Berthold Plaster  Exercises - Standing on pillows/dog bed with eyes closed in corner   - 1 x daily - 7 x weekly - 3 sets - 30-45 second hold - Sit to Stand Without Arm Support  - 1 x daily - 7 x weekly - 3 sets - 10 reps  GOALS: Goals reviewed with patient? Yes  SHORT TERM GOALS: Target date: 11/19/2022  Pt will be independent with initial HEP for PD in order to build upon functional gains made in therapy.  Baseline: pt reports exercise doing well  Goal status: MET  2.  miniBEST to be assessed with LTG written.  Baseline: 18/28 Goal status: MET  3.  Pt and pt's spouse will verbalize understanding of fall prevention in the home.  Baseline:  handout provided Goal status: MET  4.  Pt will improve gait speed with LRAD vs. No AD to at least 2.8 ft/sec in order to demo improved community mobility.  Baseline: 12.97 seconds = 2.53 ft/sec   10.8 seconds = 3.1 ft/sec Goal status: MET   LONG TERM GOALS: Target date: 12/17/2022  Pt will be independent with final HEP for PD in order to build upon functional gains made in therapy. Baseline:  Goal status: INITIAL  2.  Pt will improve gait speed with LRAD vs. No AD to at least 3.3 ft/sec in order to demo improved community mobility. Baseline:  10.8 seconds = 3.1 ft/sec Goal status: REVISED  3.  Pt will improve MiniBest to 22/28 for decreased fall risk and improvement with compensatory stepping strategies.   Baseline: 18/28 Goal status: REVISED  4.  Pt will be able to get on and off the floor with supervision for improved fall recovery.  Baseline:  Goal status: INITIAL  5. Pt will verbalize understanding of local Parkinson's disease resources, including options for continue community fitness.  Baseline:  Goal status: INITIAL   ASSESSMENT:  CLINICAL IMPRESSION: Today's skilled session focused on assessing pt's STGs. Pt has met all 4 STGs. Gave pt handout for fall prevention and went over proper bending technique with wide BOS and using legs (due to pt's recent fall when bending). Pt improved gait speed to 3.1 ft/sec (previously 2.53 ft/sec), indicating improved community mobility. With balance tasks with SLS, pt does better  with stability when slowing down. When pt moving more quickly, requires CGA/min A for balance. Continue POC.    OBJECTIVE IMPAIRMENTS: Abnormal gait, decreased activity tolerance, decreased balance, decreased coordination, decreased knowledge of condition, decreased knowledge of use of DME, difficulty walking, decreased strength, impaired flexibility, and postural dysfunction.   ACTIVITY LIMITATIONS: bending, stairs, transfers, and locomotion  level  PARTICIPATION LIMITATIONS: community activity  PERSONAL FACTORS: Age, Behavior pattern, Past/current experiences, Time since onset of injury/illness/exacerbation, and 3+ comorbidities: Parkinson's disease with mouth tremor, shuffling gait, imbalance, hypophonia, dysphagia, decreased arm swing left side, decreased sense of smell, onset February 2022, Depression, History of TIA in Feb 2022, Lumbar degenerative disc disease, A fib, HLD, HTN  are also affecting patient's functional outcome.   REHAB POTENTIAL: Good  CLINICAL DECISION MAKING: Evolving/moderate complexity  EVALUATION COMPLEXITY: Moderate  PLAN:  PT FREQUENCY: 2x/week  PT DURATION: 8 weeks  PLANNED INTERVENTIONS: 97164- PT Re-evaluation, 97110-Therapeutic exercises, 97530- Therapeutic activity, 97112- Neuromuscular re-education, 97535- Self Care, 09811- Manual therapy, 91478- Gait training, Balance training, Stair training, and DME instructions  PLAN FOR NEXT SESSION:Check BP.  Work on balance tasks, and gait with arm swing. Maybe try rollator or appropriate AD to decr risk of future falls. Lateral weight shift to R side, stepping strategies, postural control, blaze pods on mirror?   Drake Leach, PT, DPT 11/19/2022, 2:40 PM

## 2022-11-22 ENCOUNTER — Ambulatory Visit: Payer: Medicare Other | Admitting: Physical Therapy

## 2022-11-22 VITALS — BP 129/69 | HR 76

## 2022-11-22 DIAGNOSIS — R2689 Other abnormalities of gait and mobility: Secondary | ICD-10-CM | POA: Diagnosis not present

## 2022-11-22 DIAGNOSIS — R2681 Unsteadiness on feet: Secondary | ICD-10-CM

## 2022-11-22 DIAGNOSIS — R293 Abnormal posture: Secondary | ICD-10-CM | POA: Diagnosis not present

## 2022-11-22 DIAGNOSIS — M6281 Muscle weakness (generalized): Secondary | ICD-10-CM | POA: Diagnosis not present

## 2022-11-22 NOTE — Therapy (Signed)
OUTPATIENT PHYSICAL THERAPY NEURO TREATMENT   Patient Name: Jose Fuentes MRN: [id number] DOB:[birth year], 82 y.o., male Today's Date: 11/22/2022   PCP: Laurann Montana, MD   REFERRING PROVIDER: Janice Coffin, PA-C  END OF SESSION:  PT End of Session - 11/22/22 1106     Visit Number 8    Number of Visits 13    Date for PT Re-Evaluation 12/21/22    Authorization Type Medicare - Part A & B    PT Start Time 1105    PT Stop Time 1145    PT Time Calculation (min) 40 min    Equipment Utilized During Treatment Gait belt    Activity Tolerance Patient tolerated treatment well    Behavior During Therapy WFL for tasks assessed/performed                   Past Medical History:  Diagnosis Date   Atrial fibrillation (HCC)    Depression    History of melanoma    Hyperlipidemia    Hypertension    Peyronie disease    Past Surgical History:  Procedure Laterality Date   KNEE SURGERY Left    meniscal tear   MELANOMA EXCISION     Thumb replacement Left    TONSILLECTOMY     Patient Active Problem List   Diagnosis Date Noted   Rectal trauma 03/01/2020   HLD (hyperlipidemia) 03/01/2020   TIA (transient ischemic attack) 02/14/2020   Gait instability 02/14/2020   Alcohol use 02/14/2020   Depression 02/14/2020   GERD (gastroesophageal reflux disease) 02/14/2020   AF (paroxysmal atrial fibrillation) (HCC) 06/11/2014    ONSET DATE: 09/10/2022  REFERRING DIAG: G20.A1 (ICD-10-CM) - Parkinson's disease without dyskinesia, without mention of fluctuations  THERAPY DIAG:  Muscle weakness (generalized)  Other abnormalities of gait and mobility  Abnormal posture  Unsteadiness on feet  Rationale for Evaluation and Treatment: Rehabilitation  SUBJECTIVE:                                                                                                                                                                                             SUBJECTIVE STATEMENT: GOES  BY Jose Fuentes  Pt reports doing well. Still working on raking up leaves at his house, "it is a job". No falls since last session.   Pt accompanied by:  Self  PERTINENT HISTORY: PMH: Parkinson's disease with mouth tremor, shuffling gait, imbalance, hypophonia, dysphagia, decreased arm swing left side, decreased sense of smell, onset February 2022, Depression, History of TIA in Feb 2022, Lumbar degenerative disc disease, A fib, HLD, HTN  Patient has discontinued the Carbidopa-Levodopa due to excessive  fatigue.  07/22/2022 Syn-one biopsy results (lab receipt date 06/29/2022 ; lab report date 07/15/2022  Synucleinopathy: There is no pathologic evidence of phosphorylated alpha-synuclein deposition within cutaneous  nerves. The absence of alpha-synuclein does not exclude a diagnosis of synucleinopathy.   PAIN:  Are you having pain? No  PRECAUTIONS: Fall  FALLS: Has patient fallen in last 6 months? Yes. Number of falls >10 falls  LIVING ENVIRONMENT: Lives with: lives with their spouse Lives in: House/apartment Stairs: Yes: Internal: 14 steps; can reach both and External: 3 steps; none and looking into getting these railings Has following equipment at home: Walker - 2 wheeled, Grab bars, and has a walking stick he made out of wood  PLOF: Independent and Leisure: Programmer, multimedia, used to like hiking   PATIENT GOALS: Wants to learn to move bigger, take longer steps   OBJECTIVE:  Note: Objective measures were completed at Evaluation unless otherwise noted.   COGNITION: Overall cognitive status: Within functional limits for tasks assessed   SENSATION: WFL  COORDINATION: Heel to shin: slower to perform LLE    POSTURE: rounded shoulders and forward head  LOWER EXTREMITY ROM:     Limited knee extension LLE>RLE  LOWER EXTREMITY MMT:    MMT Right Eval Left Eval  Hip flexion 5 5  Hip extension    Hip abduction    Hip adduction    Hip internal rotation    Hip external rotation     Knee flexion 5 4+  Knee extension 5 5  Ankle dorsiflexion 4+ 5  Ankle plantarflexion    Ankle inversion    Ankle eversion    (Blank rows = not tested)  BED MOBILITY:  Pt reports no difficulty   TRANSFERS: Assistive device utilized: None  Sit to stand: SBA Stand to sit: SBA  Pt reports initially feeling off balance when standing up   Pt reports difficulty getting up from the floor    GAIT: Gait pattern: step through pattern, decreased arm swing- Right, decreased arm swing- Left, decreased stride length, Right foot flat, Left foot flat, shuffling, decreased trunk rotation, and trunk flexed Distance walked: Clinic distances Assistive device utilized: None Level of assistance: SBA Comments: Pt reports difficulty with side stepping or turning in small spaces   FUNCTIONAL TESTS:  5 times sit to stand: 12.1 seconds with no UE support, decr anterior weight shift during 4th and 5th rep 10 meter walk test: 12.97 seconds = 2.53 ft/sec   VITALS  Vitals:   11/22/22 1110  BP: 129/69  Pulse: 76     TODAY'S TREATMENT:  Ther Act  Assessed vitals (see above) and WNL for therapy    NMR  In hallway, performed cone drill x20' in lateral direction and fwd/retro direction for review of proper lifting technique, step length/clearance and cognitive dual-tasking. Pt demonstrated good recall of proper lifting technique from previous session but did require min cues for increased step length in all directions due to shuffling. With fwd/retro gait, added cog dual-task (naming an object that is the same color as the cone) and pt instead naming an object that starts with the same letter of the color despite max cues. No changes in balance noted w/added dual-task, CGA throughout.  Pushing Stedy fwd U981' and pulling retro x115' for improved functional strength, anterior weight shift and step clearance. Pt more challenged w/retro direction, requiring min A to stabilize around a turn due to posterior  LOB and shuffling gait, which pt unaware of. CGA in fwd direction.  Standing parallel to rebounder, alt fwd step w/rotation and ball throw/catch, x8 reps per leg per side. Pt required CGA when turning to L and min A when turning to R. Significant difficult w/large retro step on RLE noted as well. Min cues to allow ball to drop to ground if he is unable to catch it, but pt continuing to dive towards ground to catch ball.  Floor Recovery: Patient educated in floor recovery this visit using teach-back for injury assessment and sequencing of task in clinic setting.  Discussion of transfer of skills to variable scenarios outside the clinic.  Patient has most difficulty with half-kneel to stand without UE support.  Performed 3 times, once w/o UE support, once w/UE support on mat and once w/UE support on cane.  Level of Assist:  MODI. Pt demonstrated posterior LOB when performing half-kneel > stand without UE support, relying on mat table to prevent fall. Educated pt on using UE support to stabilize to stand. Pt states he typically has his wooden stick with him, so practiced holding stick in L hand and pt able to perform transfer w/no instability.    PATIENT EDUCATION: Education details: Continue HEP, proper floor recovery techniques  Person educated: Patient Education method: Explanation, Demonstration, and Verbal cues Education comprehension: verbalized understanding, returned demonstration, and verbal cues required  HOME EXERCISE PROGRAM: Standing PWR Moves  Access Code: [email or web address] URL: [email or web address] Date: 11/01/2022 Prepared by: Alethia Berthold Carlson Belland  Exercises - Standing on pillows/dog bed with eyes closed in corner   - 1 x daily - 7 x weekly - 3 sets - 30-45 second hold - Sit to Stand Without Arm Support  - 1 x daily - 7 x weekly - 3 sets - 10 reps  GOALS: Goals reviewed with patient? Yes  SHORT TERM GOALS: Target date: 11/19/2022  Pt will be independent with initial HEP  for PD in order to build upon functional gains made in therapy.  Baseline: pt reports exercise doing well  Goal status: MET  2.  miniBEST to be assessed with LTG written.  Baseline: 18/28 Goal status: MET  3.  Pt and pt's spouse will verbalize understanding of fall prevention in the home.  Baseline: handout provided Goal status: MET  4.  Pt will improve gait speed with LRAD vs. No AD to at least 2.8 ft/sec in order to demo improved community mobility.  Baseline: 12.97 seconds = 2.53 ft/sec   10.8 seconds = 3.1 ft/sec Goal status: MET   LONG TERM GOALS: Target date: 12/17/2022  Pt will be independent with final HEP for PD in order to build upon functional gains made in therapy. Baseline:  Goal status: INITIAL  2.  Pt will improve gait speed with LRAD vs. No AD to at least 3.3 ft/sec in order to demo improved community mobility. Baseline:  10.8 seconds = 3.1 ft/sec Goal status: REVISED  3.  Pt will improve MiniBest to 22/28 for decreased fall risk and improvement with compensatory stepping strategies.   Baseline: 18/28 Goal status: REVISED  4.  Pt will be able to get on and off the floor with supervision for improved fall recovery.  Baseline: mod I (11/18) Goal status: MET  5. Pt will verbalize understanding of local Parkinson's disease resources, including options for continue community fitness.  Baseline:  Goal status: INITIAL   ASSESSMENT:  CLINICAL IMPRESSION: Emphasis of skilled PT session on assessing recall on proper lifting technique, functional strength, dual-tasking and fall recovery. Pt most challenged w/cognitive dual-tasks, unable to properly  follow therapist's cues this date but no change in balance noted w/added task. Pt w/increased instability when rotating to R side, so encouraged pt to be extra careful while raking leaves If rotating to the R. Pt able to perform floor transfer mod I w/light UE this date, meeting his LTG # 4. Continue POC.    OBJECTIVE  IMPAIRMENTS: Abnormal gait, decreased activity tolerance, decreased balance, decreased coordination, decreased knowledge of condition, decreased knowledge of use of DME, difficulty walking, decreased strength, impaired flexibility, and postural dysfunction.   ACTIVITY LIMITATIONS: bending, stairs, transfers, and locomotion level  PARTICIPATION LIMITATIONS: community activity  PERSONAL FACTORS: Age, Behavior pattern, Past/current experiences, Time since onset of injury/illness/exacerbation, and 3+ comorbidities: Parkinson's disease with mouth tremor, shuffling gait, imbalance, hypophonia, dysphagia, decreased arm swing left side, decreased sense of smell, onset February 2022, Depression, History of TIA in Feb 2022, Lumbar degenerative disc disease, A fib, HLD, HTN  are also affecting patient's functional outcome.   REHAB POTENTIAL: Good  CLINICAL DECISION MAKING: Evolving/moderate complexity  EVALUATION COMPLEXITY: Moderate  PLAN:  PT FREQUENCY: 2x/week  PT DURATION: 8 weeks  PLANNED INTERVENTIONS: 97164- PT Re-evaluation, 97110-Therapeutic exercises, 97530- Therapeutic activity, 97112- Neuromuscular re-education, 97535- Self Care, 16109- Manual therapy, 60454- Gait training, Balance training, Stair training, and DME instructions  PLAN FOR NEXT SESSION:Check BP.  Work on balance tasks, and gait with arm swing. Maybe try rollator or appropriate AD to decr risk of future falls. Lateral weight shift to R side, stepping strategies, postural control, blaze pods on mirror? Functional strength    Jill Alexanders Amor Packard, PT, DPT 11/22/2022, 11:45 AM

## 2022-11-26 ENCOUNTER — Ambulatory Visit: Payer: Medicare Other | Admitting: Physical Therapy

## 2022-11-26 ENCOUNTER — Encounter: Payer: Self-pay | Admitting: Physical Therapy

## 2022-11-26 DIAGNOSIS — R2689 Other abnormalities of gait and mobility: Secondary | ICD-10-CM

## 2022-11-26 DIAGNOSIS — R2681 Unsteadiness on feet: Secondary | ICD-10-CM | POA: Diagnosis not present

## 2022-11-26 DIAGNOSIS — M6281 Muscle weakness (generalized): Secondary | ICD-10-CM

## 2022-11-26 DIAGNOSIS — R293 Abnormal posture: Secondary | ICD-10-CM | POA: Diagnosis not present

## 2022-11-26 NOTE — Therapy (Signed)
OUTPATIENT PHYSICAL THERAPY NEURO TREATMENT   Patient Name: Jose Fuentes MRN: [id number] DOB:November 01, 1940, 82 y.o., male Today's Date: 11/26/2022   PCP: Laurann Montana, MD   REFERRING PROVIDER: Janice Coffin, PA-C  END OF SESSION:  PT End of Session - 11/26/22 1019     Visit Number 9    Number of Visits 13    Date for PT Re-Evaluation 12/21/22    Authorization Type Medicare - Part A & B    PT Start Time 1017    PT Stop Time 1056    PT Time Calculation (min) 39 min    Equipment Utilized During Treatment Gait belt    Activity Tolerance Patient tolerated treatment well    Behavior During Therapy WFL for tasks assessed/performed                   Past Medical History:  Diagnosis Date   Atrial fibrillation (HCC)    Depression    History of melanoma    Hyperlipidemia    Hypertension    Peyronie disease    Past Surgical History:  Procedure Laterality Date   KNEE SURGERY Left    meniscal tear   MELANOMA EXCISION     Thumb replacement Left    TONSILLECTOMY     Patient Active Problem List   Diagnosis Date Noted   Rectal trauma 03/01/2020   HLD (hyperlipidemia) 03/01/2020   TIA (transient ischemic attack) 02/14/2020   Gait instability 02/14/2020   Alcohol use 02/14/2020   Depression 02/14/2020   GERD (gastroesophageal reflux disease) 02/14/2020   AF (paroxysmal atrial fibrillation) (HCC) 06/11/2014    ONSET DATE: 09/10/2022  REFERRING DIAG: G20.A1 (ICD-10-CM) - Parkinson's disease without dyskinesia, without mention of fluctuations  THERAPY DIAG:  Muscle weakness (generalized)  Other abnormalities of gait and mobility  Abnormal posture  Unsteadiness on feet  Rationale for Evaluation and Treatment: Rehabilitation  SUBJECTIVE:                                                                                                                                                                                             SUBJECTIVE STATEMENT: GOES  BY Jose Fuentes  No falls. Nothing new. Has been feeling a little bit more balanced when he has been walking.   Pt accompanied by:  Self  PERTINENT HISTORY: PMH: Parkinson's disease with mouth tremor, shuffling gait, imbalance, hypophonia, dysphagia, decreased arm swing left side, decreased sense of smell, onset February 2022, Depression, History of TIA in Feb 2022, Lumbar degenerative disc disease, A fib, HLD, HTN  Patient has discontinued the Carbidopa-Levodopa due to excessive fatigue.  07/22/2022 Syn-one biopsy  results (lab receipt date 06/29/2022 ; lab report date 07/15/2022  Synucleinopathy: There is no pathologic evidence of phosphorylated alpha-synuclein deposition within cutaneous  nerves. The absence of alpha-synuclein does not exclude a diagnosis of synucleinopathy.   PAIN:  Are you having pain? No  PRECAUTIONS: Fall  FALLS: Has patient fallen in last 6 months? Yes. Number of falls >10 falls  LIVING ENVIRONMENT: Lives with: lives with their spouse Lives in: House/apartment Stairs: Yes: Internal: 14 steps; can reach both and External: 3 steps; none and looking into getting these railings Has following equipment at home: Walker - 2 wheeled, Grab bars, and has a walking stick he made out of wood  PLOF: Independent and Leisure: Programmer, multimedia, used to like hiking   PATIENT GOALS: Wants to learn to move bigger, take longer steps   OBJECTIVE:  Note: Objective measures were completed at Evaluation unless otherwise noted.   COGNITION: Overall cognitive status: Within functional limits for tasks assessed   SENSATION: WFL  COORDINATION: Heel to shin: slower to perform LLE    POSTURE: rounded shoulders and forward head  LOWER EXTREMITY ROM:     Limited knee extension LLE>RLE  LOWER EXTREMITY MMT:    MMT Right Eval Left Eval  Hip flexion 5 5  Hip extension    Hip abduction    Hip adduction    Hip internal rotation    Hip external rotation    Knee flexion 5 4+   Knee extension 5 5  Ankle dorsiflexion 4+ 5  Ankle plantarflexion    Ankle inversion    Ankle eversion    (Blank rows = not tested)  BED MOBILITY:  Pt reports no difficulty   TRANSFERS: Assistive device utilized: None  Sit to stand: SBA Stand to sit: SBA  Pt reports initially feeling off balance when standing up   Pt reports difficulty getting up from the floor    GAIT: Gait pattern: step through pattern, decreased arm swing- Right, decreased arm swing- Left, decreased stride length, Right foot flat, Left foot flat, shuffling, decreased trunk rotation, and trunk flexed Distance walked: Clinic distances Assistive device utilized: None Level of assistance: SBA Comments: Pt reports difficulty with side stepping or turning in small spaces   FUNCTIONAL TESTS:  5 times sit to stand: 12.1 seconds with no UE support, decr anterior weight shift during 4th and 5th rep 10 meter walk test: 12.97 seconds = 2.53 ft/sec   VITALS  There were no vitals filed for this visit.    TODAY'S TREATMENT:  Ther Ex  SciFit multi-peaks level 8 for 8 minutes using BUE/BLEs for neural priming for reciprocal movement, dynamic cardiovascular warmup and increased amplitude of stepping. RPE of 5/10 following activity    NMR  With 5 blaze pods on mirror in a semi-circle (superiorly and laterally) for visual scanning, trunk rotation, reaction time, weight shifting, performing reaching across body. Pt standing on blue foam beam for compliant surface. Cues to open up hands when tapping  3 bouts of 1 minute: 18 hits, 27 hits, 30 hits Pt reporting RPE as 7/10 Pt needing to grab onto bar at times for balance and min A at times due to pt almost losing balance posteriorly, pt improved with incr practice  On blue foam beam: Side stepping with cues for big side step and SLS taps with each foot to 5 cones, performed down and back x3 reps, cues to slow pace down as pt with improved balance this way, pt needing  CGA/min A for  balance, pt needing less assist during last rep and demonstrated improved stability  Alternating forward step with trunk rotation and tossing purple ball to floor and catching it, 10 reps each side, cued for wider step with RLE as pt with tendency to step it forwards with too narrow of a BOS and frequently losing his balance. Then progressed to holding 4# medicine ball and using incr amplitude to throw it to floor and catch it 10 reps each side On rockerboard in A/P direction: alternating forward stepping with contralateral march for dynamic SLS, 10 reps each side, pt with difficulty maintaining his balance when stepping on and decr control when stepping on    PATIENT EDUCATION: Education details: Continue HEP Person educated: Patient Education method: Medical illustrator Education comprehension: verbalized understanding, returned demonstration, and verbal cues required  HOME EXERCISE PROGRAM: Standing PWR Moves  Access Code: [email or web address] URL: [email or web address] Date: 11/01/2022 Prepared by: Alethia Berthold Plaster  Exercises - Standing on pillows/dog bed with eyes closed in corner   - 1 x daily - 7 x weekly - 3 sets - 30-45 second hold - Sit to Stand Without Arm Support  - 1 x daily - 7 x weekly - 3 sets - 10 reps  GOALS: Goals reviewed with patient? Yes  SHORT TERM GOALS: Target date: 11/19/2022  Pt will be independent with initial HEP for PD in order to build upon functional gains made in therapy.  Baseline: pt reports exercise doing well  Goal status: MET  2.  miniBEST to be assessed with LTG written.  Baseline: 18/28 Goal status: MET  3.  Pt and pt's spouse will verbalize understanding of fall prevention in the home.  Baseline: handout provided Goal status: MET  4.  Pt will improve gait speed with LRAD vs. No AD to at least 2.8 ft/sec in order to demo improved community mobility.  Baseline: 12.97 seconds = 2.53 ft/sec   10.8 seconds = 3.1  ft/sec Goal status: MET   LONG TERM GOALS: Target date: 12/17/2022  Pt will be independent with final HEP for PD in order to build upon functional gains made in therapy. Baseline:  Goal status: INITIAL  2.  Pt will improve gait speed with LRAD vs. No AD to at least 3.3 ft/sec in order to demo improved community mobility. Baseline:  10.8 seconds = 3.1 ft/sec Goal status: REVISED  3.  Pt will improve MiniBest to 22/28 for decreased fall risk and improvement with compensatory stepping strategies.   Baseline: 18/28 Goal status: REVISED  4.  Pt will be able to get on and off the floor with supervision for improved fall recovery.  Baseline: mod I (11/18) Goal status: MET  5. Pt will verbalize understanding of local Parkinson's disease resources, including options for continue community fitness.  Baseline:  Goal status: INITIAL   ASSESSMENT:  CLINICAL IMPRESSION: Today's skilled session focused on balance tasks with use of trunk rotations, stepping tasks, and SLS activities. Pt initially needing more assist for balance tasks with CGA/min A and decr assist needed with incr reps of an exercise. Pt does better with balance tasks, esp with trunk rotations and SLS when pt slows down instead of going quickly. Will continue per POC.     OBJECTIVE IMPAIRMENTS: Abnormal gait, decreased activity tolerance, decreased balance, decreased coordination, decreased knowledge of condition, decreased knowledge of use of DME, difficulty walking, decreased strength, impaired flexibility, and postural dysfunction.   ACTIVITY LIMITATIONS: bending, stairs, transfers, and locomotion level  PARTICIPATION LIMITATIONS: community activity  PERSONAL FACTORS: Age, Behavior pattern, Past/current experiences, Time since onset of injury/illness/exacerbation, and 3+ comorbidities: Parkinson's disease with mouth tremor, shuffling gait, imbalance, hypophonia, dysphagia, decreased arm swing left side, decreased sense of  smell, onset February 2022, Depression, History of TIA in Feb 2022, Lumbar degenerative disc disease, A fib, HLD, HTN  are also affecting patient's functional outcome.   REHAB POTENTIAL: Good  CLINICAL DECISION MAKING: Evolving/moderate complexity  EVALUATION COMPLEXITY: Moderate  PLAN:  PT FREQUENCY: 2x/week  PT DURATION: 8 weeks  PLANNED INTERVENTIONS: 97164- PT Re-evaluation, 97110-Therapeutic exercises, 97530- Therapeutic activity, 97112- Neuromuscular re-education, 97535- Self Care, 16109- Manual therapy, 60454- Gait training, Balance training, Stair training, and DME instructions  PLAN FOR NEXT SESSION: Check BP. WILL NEED 10TH VISIT PN!   Work on balance tasks, and gait with arm swing. Maybe try rollator or appropriate AD to decr risk of future falls. Lateral weight shift to R side, stepping strategies, postural control, blaze pods on mirror? Functional strength    Drake Leach, PT, DPT 11/26/2022, 10:58 AM

## 2022-11-28 ENCOUNTER — Other Ambulatory Visit: Payer: Self-pay | Admitting: Medical Genetics

## 2022-11-28 DIAGNOSIS — Z006 Encounter for examination for normal comparison and control in clinical research program: Secondary | ICD-10-CM

## 2022-11-29 ENCOUNTER — Ambulatory Visit: Payer: Medicare Other | Admitting: Physical Therapy

## 2022-11-29 VITALS — BP 128/75 | HR 74

## 2022-11-29 DIAGNOSIS — R293 Abnormal posture: Secondary | ICD-10-CM | POA: Diagnosis not present

## 2022-11-29 DIAGNOSIS — R2689 Other abnormalities of gait and mobility: Secondary | ICD-10-CM

## 2022-11-29 DIAGNOSIS — M6281 Muscle weakness (generalized): Secondary | ICD-10-CM | POA: Diagnosis not present

## 2022-11-29 DIAGNOSIS — R2681 Unsteadiness on feet: Secondary | ICD-10-CM | POA: Diagnosis not present

## 2022-11-29 NOTE — Therapy (Signed)
OUTPATIENT PHYSICAL THERAPY NEURO TREATMENT- 10TH VISIT PROGRESS NOTE   Patient Name: Jose Fuentes MRN: [id number] DOB:09/28/1940, 82 y.o., male Today's Date: 11/29/2022   PCP: Laurann Montana, MD   REFERRING PROVIDER: Janice Coffin, PA-C  Physical Therapy Progress Note   Dates of Reporting Period:10/22/22 - 11/29/22  See Note below for Objective Data and Assessment of Progress/Goals.    END OF SESSION:  PT End of Session - 11/29/22 1104     Visit Number 10    Number of Visits 13    Date for PT Re-Evaluation 12/21/22    Authorization Type Medicare - Part A & B    PT Start Time 1102    PT Stop Time 1145    PT Time Calculation (min) 43 min    Equipment Utilized During Treatment Gait belt    Activity Tolerance Patient tolerated treatment well    Behavior During Therapy WFL for tasks assessed/performed                    Past Medical History:  Diagnosis Date   Atrial fibrillation (HCC)    Depression    History of melanoma    Hyperlipidemia    Hypertension    Peyronie disease    Past Surgical History:  Procedure Laterality Date   KNEE SURGERY Left    meniscal tear   MELANOMA EXCISION     Thumb replacement Left    TONSILLECTOMY     Patient Active Problem List   Diagnosis Date Noted   Rectal trauma 03/01/2020   HLD (hyperlipidemia) 03/01/2020   TIA (transient ischemic attack) 02/14/2020   Gait instability 02/14/2020   Alcohol use 02/14/2020   Depression 02/14/2020   GERD (gastroesophageal reflux disease) 02/14/2020   AF (paroxysmal atrial fibrillation) (HCC) 06/11/2014    ONSET DATE: 09/10/2022  REFERRING DIAG: G20.A1 (ICD-10-CM) - Parkinson's disease without dyskinesia, without mention of fluctuations  THERAPY DIAG:  Other abnormalities of gait and mobility  Unsteadiness on feet  Muscle weakness (generalized)  Rationale for Evaluation and Treatment: Rehabilitation  SUBJECTIVE:                                                                                                                                                                                              SUBJECTIVE STATEMENT: GOES BY [name]  Pt reports doing well. Denies falls or acute changes. No pain. Was a bit dizzy this morning but feels better now.   Pt accompanied by:  Self  PERTINENT HISTORY: PMH: Parkinson's disease with mouth tremor, shuffling gait, imbalance, hypophonia, dysphagia, decreased arm swing left side, decreased sense of smell,  onset February 2022, Depression, History of TIA in Feb 2022, Lumbar degenerative disc disease, A fib, HLD, HTN  Patient has discontinued the Carbidopa-Levodopa due to excessive fatigue.  07/22/2022 Syn-one biopsy results (lab receipt date 06/29/2022 ; lab report date 07/15/2022  Synucleinopathy: There is no pathologic evidence of phosphorylated alpha-synuclein deposition within cutaneous  nerves. The absence of alpha-synuclein does not exclude a diagnosis of synucleinopathy.   PAIN:  Are you having pain? No  PRECAUTIONS: Fall  FALLS: Has patient fallen in last 6 months? Yes. Number of falls >10 falls  LIVING ENVIRONMENT: Lives with: lives with their spouse Lives in: House/apartment Stairs: Yes: Internal: 14 steps; can reach both and External: 3 steps; none and looking into getting these railings Has following equipment at home: Walker - 2 wheeled, Grab bars, and has a walking stick he made out of wood  PLOF: Independent and Leisure: Programmer, multimedia, used to like hiking   PATIENT GOALS: Wants to learn to move bigger, take longer steps   OBJECTIVE:  Note: Objective measures were completed at Evaluation unless otherwise noted.   COGNITION: Overall cognitive status: Within functional limits for tasks assessed   SENSATION: WFL  COORDINATION: Heel to shin: slower to perform LLE    POSTURE: rounded shoulders and forward head  LOWER EXTREMITY ROM:     Limited knee extension LLE>RLE  LOWER  EXTREMITY MMT:    MMT Right Eval Left Eval  Hip flexion 5 5  Hip extension    Hip abduction    Hip adduction    Hip internal rotation    Hip external rotation    Knee flexion 5 4+  Knee extension 5 5  Ankle dorsiflexion 4+ 5  Ankle plantarflexion    Ankle inversion    Ankle eversion    (Blank rows = not tested)  BED MOBILITY:  Pt reports no difficulty   TRANSFERS: Assistive device utilized: None  Sit to stand: SBA Stand to sit: SBA  Pt reports initially feeling off balance when standing up   Pt reports difficulty getting up from the floor    GAIT: Gait pattern: step through pattern, decreased arm swing- Right, decreased arm swing- Left, decreased stride length, Right foot flat, Left foot flat, shuffling, decreased trunk rotation, and trunk flexed Distance walked: Clinic distances Assistive device utilized: None Level of assistance: SBA Comments: Pt reports difficulty with side stepping or turning in small spaces   FUNCTIONAL TESTS:  5 times sit to stand: 12.1 seconds with no UE support, decr anterior weight shift during 4th and 5th rep 10 meter walk test: 12.97 seconds = 2.53 ft/sec   VITALS  Vitals:   11/29/22 1109  BP: 128/75  Pulse: 74     TODAY'S TREATMENT:  Ther Act  Assessed vitals (see above) and WNL   Ther Ex  SciFit multi-peaks level 12 for 8 minutes using BUE/BLEs for neural priming for reciprocal movement, dynamic cardiovascular warmup and increased amplitude of stepping.   NMR  5 Blaze pods on random reach setting placed on mirror for improved step clearance, truncal rotation and dual-tasking.  Performed on 2 minute intervals with 1 minute rest periods.  Pt requires CGA guarding and frequent UE support for balance correction. Round 1:  On blue balance beam w/4 pods placed posterolaterally and one pod placed inferiorly. 42 hits. Pt reaching to hit pod rather than side stepping. Frequent posterior LOB requiring min A and BUE support to correct,  but pt does demonstrate adequate righting reactions.  Round 2:  same setup w/added dual-task (naming animals if pod is red and foods if pod is blue). Pt w/increased impulsivity w/added task and required increased assistance due to worsening balance.  21 hits. Round 3:  same setup. Cued pt to slow down and focus on proper naming of objects and noted improved balance, but pt frequently mixing up colors.  17 hits. Alt fwd step w/contralateral OH scarf tosses, x10 per side, for improved reciprocal coordination. Pt w/increased difficulty maintaining sequence when stepping w/RLE >LLE. CGA throughout w/one single instance of posterior LOB due to RLE catching, requiring min A.     PATIENT EDUCATION: Education details: Continue HEP Person educated: Patient Education method: Psychiatrist comprehension: verbalized understanding, returned demonstration, and verbal cues required  HOME EXERCISE PROGRAM: Standing PWR Moves  Access Code: [email or web address] URL: [email or web address] Date: 11/01/2022 Prepared by: Alethia Berthold Riel Hirschman  Exercises - Standing on pillows/dog bed with eyes closed in corner   - 1 x daily - 7 x weekly - 3 sets - 30-45 second hold - Sit to Stand Without Arm Support  - 1 x daily - 7 x weekly - 3 sets - 10 reps  GOALS: Goals reviewed with patient? Yes  SHORT TERM GOALS: Target date: 11/19/2022  Pt will be independent with initial HEP for PD in order to build upon functional gains made in therapy.  Baseline: pt reports exercise doing well  Goal status: MET  2.  miniBEST to be assessed with LTG written.  Baseline: 18/28 Goal status: MET  3.  Pt and pt's spouse will verbalize understanding of fall prevention in the home.  Baseline: handout provided Goal status: MET  4.  Pt will improve gait speed with LRAD vs. No AD to at least 2.8 ft/sec in order to demo improved community mobility.  Baseline: 12.97 seconds = 2.53 ft/sec   10.8 seconds = 3.1  ft/sec Goal status: MET   LONG TERM GOALS: Target date: 12/17/2022  Pt will be independent with final HEP for PD in order to build upon functional gains made in therapy. Baseline:  Goal status: INITIAL  2.  Pt will improve gait speed with LRAD vs. No AD to at least 3.3 ft/sec in order to demo improved community mobility. Baseline:  10.8 seconds = 3.1 ft/sec Goal status: REVISED  3.  Pt will improve MiniBest to 22/28 for decreased fall risk and improvement with compensatory stepping strategies.   Baseline: 18/28 Goal status: REVISED  4.  Pt will be able to get on and off the floor with supervision for improved fall recovery.  Baseline: mod I (11/18) Goal status: MET  5. Pt will verbalize understanding of local Parkinson's disease resources, including options for continue community fitness.  Baseline:  Goal status: INITIAL   ASSESSMENT:  CLINICAL IMPRESSION: Emphasis of skilled PT session on reciprocal coordination, dual-tasking and anticipatory balance strategies. Pt demonstrates reduced balance strategies when moving too quickly or when distracted, requiring cues to slow down. Pt most challenged by addition of cog dual-task this date, demonstrating increased posterior LOB w/added task. However, pt did improve on second trial and when cued to slow down. Continue POC.     OBJECTIVE IMPAIRMENTS: Abnormal gait, decreased activity tolerance, decreased balance, decreased coordination, decreased knowledge of condition, decreased knowledge of use of DME, difficulty walking, decreased strength, impaired flexibility, and postural dysfunction.   ACTIVITY LIMITATIONS: bending, stairs, transfers, and locomotion level  PARTICIPATION LIMITATIONS: community activity  PERSONAL FACTORS: Age, Behavior pattern, Past/current experiences, Time since onset of injury/illness/exacerbation, and  3+ comorbidities: Parkinson's disease with mouth tremor, shuffling gait, imbalance, hypophonia, dysphagia,  decreased arm swing left side, decreased sense of smell, onset February 2022, Depression, History of TIA in Feb 2022, Lumbar degenerative disc disease, A fib, HLD, HTN  are also affecting patient's functional outcome.   REHAB POTENTIAL: Good  CLINICAL DECISION MAKING: Evolving/moderate complexity  EVALUATION COMPLEXITY: Moderate  PLAN:  PT FREQUENCY: 2x/week  PT DURATION: 8 weeks  PLANNED INTERVENTIONS: 97164- PT Re-evaluation, 97110-Therapeutic exercises, 97530- Therapeutic activity, 97112- Neuromuscular re-education, 97535- Self Care, 82956- Manual therapy, 21308- Gait training, Balance training, Stair training, and DME instructions  PLAN FOR NEXT SESSION: Check BP.  Work on balance tasks, and gait with arm swing. Maybe try rollator or appropriate AD to decr risk of future falls. Lateral weight shift to R side, stepping strategies, postural control, blaze pods on mirror? Functional strength   *plan to DC at end of POC. 51mo screens?   Jill Alexanders Alaze Garverick, PT, DPT 11/29/2022, 11:46 AM

## 2022-12-06 ENCOUNTER — Ambulatory Visit: Payer: Medicare Other | Attending: Family Medicine | Admitting: Physical Therapy

## 2022-12-06 ENCOUNTER — Encounter: Payer: Self-pay | Admitting: Physical Therapy

## 2022-12-06 DIAGNOSIS — M6281 Muscle weakness (generalized): Secondary | ICD-10-CM | POA: Diagnosis not present

## 2022-12-06 DIAGNOSIS — R2681 Unsteadiness on feet: Secondary | ICD-10-CM

## 2022-12-06 DIAGNOSIS — R293 Abnormal posture: Secondary | ICD-10-CM

## 2022-12-06 DIAGNOSIS — R2689 Other abnormalities of gait and mobility: Secondary | ICD-10-CM

## 2022-12-06 NOTE — Therapy (Signed)
OUTPATIENT PHYSICAL THERAPY NEURO TREATMENT   Patient Name: Jose Fuentes MRN: [id number] DOB:1940/08/24, 82 y.o., male Today's Date: 12/06/2022   PCP: Laurann Montana, MD   REFERRING PROVIDER: Janice Coffin, PA-C   END OF SESSION:  PT End of Session - 12/06/22 1107     Visit Number 11    Number of Visits 13    Date for PT Re-Evaluation 12/21/22    Authorization Type Medicare - Part A & B    PT Start Time 1105    PT Stop Time 1145    PT Time Calculation (min) 40 min    Equipment Utilized During Treatment Gait belt    Activity Tolerance Patient tolerated treatment well    Behavior During Therapy WFL for tasks assessed/performed                    Past Medical History:  Diagnosis Date   Atrial fibrillation (HCC)    Depression    History of melanoma    Hyperlipidemia    Hypertension    Peyronie disease    Past Surgical History:  Procedure Laterality Date   KNEE SURGERY Left    meniscal tear   MELANOMA EXCISION     Thumb replacement Left    TONSILLECTOMY     Patient Active Problem List   Diagnosis Date Noted   Rectal trauma 03/01/2020   HLD (hyperlipidemia) 03/01/2020   TIA (transient ischemic attack) 02/14/2020   Gait instability 02/14/2020   Alcohol use 02/14/2020   Depression 02/14/2020   GERD (gastroesophageal reflux disease) 02/14/2020   AF (paroxysmal atrial fibrillation) (HCC) 06/11/2014    ONSET DATE: 09/10/2022  REFERRING DIAG: G20.A1 (ICD-10-CM) - Parkinson's disease without dyskinesia, without mention of fluctuations  THERAPY DIAG:  Other abnormalities of gait and mobility  Unsteadiness on feet  Muscle weakness (generalized)  Abnormal posture  Rationale for Evaluation and Treatment: Rehabilitation  SUBJECTIVE:                                                                                                                                                                                             SUBJECTIVE  STATEMENT: GOES BY [name]  Had a fall - when working with leaves and lost balance backwards into a big pile of leaves. Pt reports it was fun. Did not hurt himself and was able to get up on his own. Wants to work on some blaze pods and his balance.   Pt accompanied by:  Self  PERTINENT HISTORY: PMH: Parkinson's disease with mouth tremor, shuffling gait, imbalance, hypophonia, dysphagia, decreased arm swing left side, decreased sense of smell, onset  February 2022, Depression, History of TIA in Feb 2022, Lumbar degenerative disc disease, A fib, HLD, HTN  Patient has discontinued the Carbidopa-Levodopa due to excessive fatigue.  07/22/2022 Syn-one biopsy results (lab receipt date 06/29/2022 ; lab report date 07/15/2022  Synucleinopathy: There is no pathologic evidence of phosphorylated alpha-synuclein deposition within cutaneous  nerves. The absence of alpha-synuclein does not exclude a diagnosis of synucleinopathy.   PAIN:  Are you having pain? No  PRECAUTIONS: Fall  FALLS: Has patient fallen in last 6 months? Yes. Number of falls >10 falls  LIVING ENVIRONMENT: Lives with: lives with their spouse Lives in: House/apartment Stairs: Yes: Internal: 14 steps; can reach both and External: 3 steps; none and looking into getting these railings Has following equipment at home: Walker - 2 wheeled, Grab bars, and has a walking stick he made out of wood  PLOF: Independent and Leisure: Programmer, multimedia, used to like hiking   PATIENT GOALS: Wants to learn to move bigger, take longer steps   OBJECTIVE:  Note: Objective measures were completed at Evaluation unless otherwise noted.   COGNITION: Overall cognitive status: Within functional limits for tasks assessed   SENSATION: WFL  COORDINATION: Heel to shin: slower to perform LLE    POSTURE: rounded shoulders and forward head  LOWER EXTREMITY ROM:     Limited knee extension LLE>RLE  LOWER EXTREMITY MMT:    MMT Right Eval Left Eval   Hip flexion 5 5  Hip extension    Hip abduction    Hip adduction    Hip internal rotation    Hip external rotation    Knee flexion 5 4+  Knee extension 5 5  Ankle dorsiflexion 4+ 5  Ankle plantarflexion    Ankle inversion    Ankle eversion    (Blank rows = not tested)  BED MOBILITY:  Pt reports no difficulty   TRANSFERS: Assistive device utilized: None  Sit to stand: SBA Stand to sit: SBA  Pt reports initially feeling off balance when standing up   Pt reports difficulty getting up from the floor    GAIT: Gait pattern: step through pattern, decreased arm swing- Right, decreased arm swing- Left, decreased stride length, Right foot flat, Left foot flat, shuffling, decreased trunk rotation, and trunk flexed Distance walked: Clinic distances Assistive device utilized: None Level of assistance: SBA Comments: Pt reports difficulty with side stepping or turning in small spaces   FUNCTIONAL TESTS:  5 times sit to stand: 12.1 seconds with no UE support, decr anterior weight shift during 4th and 5th rep 10 meter walk test: 12.97 seconds = 2.53 ft/sec   VITALS  There were no vitals filed for this visit.    TODAY'S TREATMENT:  Ther Ex  SciFit multi-peaks level 10 for 8 minutes using BUE/BLEs for neural priming for reciprocal movement, dynamic cardiovascular warmup and increased amplitude of stepping. Pt reporting RPE as 7-8/10 during the hills.    NMR  6 blaze pods on focus on one color setting with distracting colors with 3 pods on the bottom step and 3 pods on the 2nd step for foot clearance, SLS, weight shifting, and dual tasking. Pt requires CGA guarding and frequent UE support for balance correction, esp in the posterior direction. Performed on blue foam beam with pt alternating between R/L sides when tapping:  Performed 2 bouts of 1 minute: 22 hits, 19 hits Performed 2 bouts of 1 minute with cognitive challenge: 12 hits, 3 hits (naming animals in alphabetical  order) Cues needed to  slow down for balance, as pt frequently losing balance in the posterior direction, did better when slowing down  With 6 stepping stones next to countertop working on step length, and SLS: down and back x1 rep with focus on slowed pace for balance, performed an additional down and back 3 reps with added cognitive task with pt naming chemicals/elements on the periodic table in alphabetical order, min guard at times for balance  On rockerboard in M/L direction: Working on trunk rotation and reaching outside of BOS to grab bean bag, steady self on board using appropriate hip/ankle strategies, and then toss into crate, performed 15 reps each side, cued to also look at hand when reaching, pt wit more difficulty keeping weight shift and balance when going to the R, needing to use intermittent UE support at times for balance.    PATIENT EDUCATION: Education details: Continue HEP Person educated: Patient Education method: Psychiatrist comprehension: verbalized understanding, returned demonstration, and verbal cues required  HOME EXERCISE PROGRAM: Standing PWR Moves  Access Code: [email or web address] URL: [email or web address] Date: 11/01/2022 Prepared by: Alethia Berthold Plaster  Exercises - Standing on pillows/dog bed with eyes closed in corner   - 1 x daily - 7 x weekly - 3 sets - 30-45 second hold - Sit to Stand Without Arm Support  - 1 x daily - 7 x weekly - 3 sets - 10 reps  GOALS: Goals reviewed with patient? Yes  SHORT TERM GOALS: Target date: 11/19/2022  Pt will be independent with initial HEP for PD in order to build upon functional gains made in therapy.  Baseline: pt reports exercise doing well  Goal status: MET  2.  miniBEST to be assessed with LTG written.  Baseline: 18/28 Goal status: MET  3.  Pt and pt's spouse will verbalize understanding of fall prevention in the home.  Baseline: handout provided Goal status: MET  4.  Pt  will improve gait speed with LRAD vs. No AD to at least 2.8 ft/sec in order to demo improved community mobility.  Baseline: 12.97 seconds = 2.53 ft/sec   10.8 seconds = 3.1 ft/sec Goal status: MET   LONG TERM GOALS: Target date: 12/17/2022  Pt will be independent with final HEP for PD in order to build upon functional gains made in therapy. Baseline:  Goal status: INITIAL  2.  Pt will improve gait speed with LRAD vs. No AD to at least 3.3 ft/sec in order to demo improved community mobility. Baseline:  10.8 seconds = 3.1 ft/sec Goal status: REVISED  3.  Pt will improve MiniBest to 22/28 for decreased fall risk and improvement with compensatory stepping strategies.   Baseline: 18/28 Goal status: REVISED  4.  Pt will be able to get on and off the floor with supervision for improved fall recovery.  Baseline: mod I (11/18) Goal status: MET  5. Pt will verbalize understanding of local Parkinson's disease resources, including options for continue community fitness.  Baseline:  Goal status: INITIAL   ASSESSMENT:  CLINICAL IMPRESSION: Today's skilled session continued to focus on reciprocal movements, dual tasking, SLS, weight shifting, and hip/ankle strategies for balance. With tasks on rockerboard, pt most challenged when having to shift weight to the R, frequently needing to use UE support for balance, but did improve with incr reps. Pt does better with balance tasks with SLS when slowing down. When pt moves too quickly, tends to lose his balance in the posterior direction. Will continue per POC.     OBJECTIVE IMPAIRMENTS:  Abnormal gait, decreased activity tolerance, decreased balance, decreased coordination, decreased knowledge of condition, decreased knowledge of use of DME, difficulty walking, decreased strength, impaired flexibility, and postural dysfunction.   ACTIVITY LIMITATIONS: bending, stairs, transfers, and locomotion level  PARTICIPATION LIMITATIONS: community  activity  PERSONAL FACTORS: Age, Behavior pattern, Past/current experiences, Time since onset of injury/illness/exacerbation, and 3+ comorbidities: Parkinson's disease with mouth tremor, shuffling gait, imbalance, hypophonia, dysphagia, decreased arm swing left side, decreased sense of smell, onset February 2022, Depression, History of TIA in Feb 2022, Lumbar degenerative disc disease, A fib, HLD, HTN  are also affecting patient's functional outcome.   REHAB POTENTIAL: Good  CLINICAL DECISION MAKING: Evolving/moderate complexity  EVALUATION COMPLEXITY: Moderate  PLAN:  PT FREQUENCY: 2x/week  PT DURATION: 8 weeks  PLANNED INTERVENTIONS: 97164- PT Re-evaluation, 97110-Therapeutic exercises, 97530- Therapeutic activity, 97112- Neuromuscular re-education, 97535- Self Care, 10272- Manual therapy, 53664- Gait training, Balance training, Stair training, and DME instructions  PLAN FOR NEXT SESSION: Check BP.  Work on balance tasks, and gait with arm swing. Maybe try rollator or appropriate AD to decr risk of future falls. Lateral weight shift to R side, stepping strategies, postural control, blaze pods on mirror? Functional strength   *plan to DC at end of POC. 51mo screens?   Drake Leach, PT, DPT 12/06/2022, 12:44 PM

## 2022-12-10 ENCOUNTER — Ambulatory Visit: Payer: Medicare Other | Admitting: Physical Therapy

## 2022-12-10 DIAGNOSIS — R2681 Unsteadiness on feet: Secondary | ICD-10-CM

## 2022-12-10 DIAGNOSIS — M6281 Muscle weakness (generalized): Secondary | ICD-10-CM

## 2022-12-10 DIAGNOSIS — R2689 Other abnormalities of gait and mobility: Secondary | ICD-10-CM

## 2022-12-10 DIAGNOSIS — R293 Abnormal posture: Secondary | ICD-10-CM | POA: Diagnosis not present

## 2022-12-10 NOTE — Therapy (Signed)
OUTPATIENT PHYSICAL THERAPY NEURO TREATMENT- DISCHARGE SUMMARY   Patient Name: Jose Fuentes MRN: [id number] DOB:May 03, 1940, 82 y.o., male Today's Date: 12/10/2022   PCP: Laurann Montana, MD   REFERRING PROVIDER: Janice Coffin, PA-C  PHYSICAL THERAPY DISCHARGE SUMMARY  Visits from Start of Care: 12  Current functional level related to goals / functional outcomes: Pt is grossly mod I w/all ADLs without use of AD   Remaining deficits: High fall risk (due to number of recent falls), impaired reactive balance strategies, poor postural control, decreased functional strength   Education / Equipment: HEP, 13mo PD screens    Patient agrees to discharge. Patient goals were met. Patient is being discharged due to meeting the stated rehab goals.    END OF SESSION:  PT End of Session - 12/10/22 1416     Visit Number 12    Number of Visits 13    Date for PT Re-Evaluation 12/21/22    Authorization Type Medicare - Part A & B    PT Start Time 1415   Previous pt session ran late - [place]   PT Stop Time 1448    PT Time Calculation (min) 33 min    Equipment Utilized During Treatment --    Activity Tolerance Patient tolerated treatment well    Behavior During Therapy WFL for tasks assessed/performed                    Past Medical History:  Diagnosis Date   Atrial fibrillation (HCC)    Depression    History of melanoma    Hyperlipidemia    Hypertension    Peyronie disease    Past Surgical History:  Procedure Laterality Date   KNEE SURGERY Left    meniscal tear   MELANOMA EXCISION     Thumb replacement Left    TONSILLECTOMY     Patient Active Problem List   Diagnosis Date Noted   Rectal trauma 03/01/2020   HLD (hyperlipidemia) 03/01/2020   TIA (transient ischemic attack) 02/14/2020   Gait instability 02/14/2020   Alcohol use 02/14/2020   Depression 02/14/2020   GERD (gastroesophageal reflux disease) 02/14/2020   AF (paroxysmal atrial fibrillation) (HCC)  06/11/2014    ONSET DATE: 09/10/2022  REFERRING DIAG: G20.A1 (ICD-10-CM) - Parkinson's disease without dyskinesia, without mention of fluctuations  THERAPY DIAG:  Other abnormalities of gait and mobility  Unsteadiness on feet  Muscle weakness (generalized)  Rationale for Evaluation and Treatment: Rehabilitation  SUBJECTIVE:  SUBJECTIVE STATEMENT: GOES BY [name]  Pt reports doing well. Denies falls or changes since last visit. Feels ready to DC today.   Pt accompanied by:  Self  PERTINENT HISTORY: PMH: Parkinson's disease with mouth tremor, shuffling gait, imbalance, hypophonia, dysphagia, decreased arm swing left side, decreased sense of smell, onset February 2022, Depression, History of TIA in Feb 2022, Lumbar degenerative disc disease, A fib, HLD, HTN  Patient has discontinued the Carbidopa-Levodopa due to excessive fatigue.  07/22/2022 Syn-one biopsy results (lab receipt date 06/29/2022 ; lab report date 07/15/2022  Synucleinopathy: There is no pathologic evidence of phosphorylated alpha-synuclein deposition within cutaneous  nerves. The absence of alpha-synuclein does not exclude a diagnosis of synucleinopathy.   PAIN:  Are you having pain? No  PRECAUTIONS: Fall  FALLS: Has patient fallen in last 6 months? Yes. Number of falls >10 falls  LIVING ENVIRONMENT: Lives with: lives with their spouse Lives in: House/apartment Stairs: Yes: Internal: 14 steps; can reach both and External: 3 steps; none and looking into getting these railings Has following equipment at home: Walker - 2 wheeled, Grab bars, and has a walking stick he made out of wood  PLOF: Independent and Leisure: Programmer, multimedia, used to like hiking   PATIENT GOALS: Wants to learn to move bigger, take longer steps   OBJECTIVE:   Note: Objective measures were completed at Evaluation unless otherwise noted.   COGNITION: Overall cognitive status: Within functional limits for tasks assessed   SENSATION: WFL  COORDINATION: Heel to shin: slower to perform LLE    POSTURE: rounded shoulders and forward head  LOWER EXTREMITY ROM:     Limited knee extension LLE>RLE  LOWER EXTREMITY MMT:    MMT Right Eval Left Eval  Hip flexion 5 5  Hip extension    Hip abduction    Hip adduction    Hip internal rotation    Hip external rotation    Knee flexion 5 4+  Knee extension 5 5  Ankle dorsiflexion 4+ 5  Ankle plantarflexion    Ankle inversion    Ankle eversion    (Blank rows = not tested)  BED MOBILITY:  Pt reports no difficulty   TRANSFERS: Assistive device utilized: None  Sit to stand: SBA Stand to sit: SBA  Pt reports initially feeling off balance when standing up   Pt reports difficulty getting up from the floor    GAIT: Gait pattern: step through pattern, decreased arm swing- Right, decreased arm swing- Left, decreased stride length, Right foot flat, Left foot flat, shuffling, decreased trunk rotation, and trunk flexed Distance walked: Clinic distances Assistive device utilized: None Level of assistance: SBA Comments: Pt reports difficulty with side stepping or turning in small spaces   FUNCTIONAL TESTS:  5 times sit to stand: 12.1 seconds with no UE support, decr anterior weight shift during 4th and 5th rep 10 meter walk test: 12.97 seconds = 2.53 ft/sec   VITALS  There were no vitals filed for this visit.   TODAY'S TREATMENT:  Ther Act LTG Assessment   [facility] PT Assessment - 12/10/22 1430       Ambulation/Gait   Gait velocity 32.8' over 9.63s = 3.4 ft/s no AD      Mini-BESTest   Sit To Stand Normal: Comes to stand without use of hands and stabilizes independently.    Rise to Toes Normal: Stable for 3 s with maximum height.    Stand on one leg (left) Moderate: < 20 s   ~1s  Stand on one leg (right) Moderate: < 20 s   3.07s   Stand on one leg - lowest score 1    Compensatory Stepping Correction - Forward Normal: Recovers independently with a single, large step (second realignement is allowed).    Compensatory Stepping Correction - Backward Moderate: More than one step is required to recover equilibrium   4 small steps   Compensatory Stepping Correction - Left Lateral Severe: Falls, or cannot step    Compensatory Stepping Correction - Right Lateral Severe:  Falls, or cannot step    Stepping Corredtion Lateral - lowest score 0    Stance - Feet together, eyes open, firm surface  Normal: 30s    Stance - Feet together, eyes closed, foam surface  Moderate: < 30s   21s   Incline - Eyes Closed Normal: Stands independently 30s and aligns with gravity    Change in Gait Speed Normal: Significantly changes walkling speed without imbalance    Walk with head turns - Horizontal Normal: performs head turns with no change in gait speed and good balance    Walk with pivot turns Normal: Turns with feet close FAST (< 3 steps) with good balance.    Step over obstacles Normal: Able to step over box with minimal change of gait speed and with good balance.    Timed UP & GO with Dual Task Moderate: Dual Task affects either counting OR walking (>10%) when compared to the TUG without Dual Task.    Mini-BEST total score 22      Timed Up and Go Test   Normal TUG (seconds) 9.28    Cognitive TUG (seconds) 12.38   retro counting by 3s           Discussed goal results and importance of pt slowing down when performing yard work or house tasks, as this is when he tends to lose his balance. Pt verbalized understanding  Provided pt w/December PD community resources handout. Pt reports he is not interested in group fitness, but appreciates the resources.  Pt in agreement to schedule 42mo PD screens and was educated on how to obtain new PT referral if mobility needs change.   PATIENT  EDUCATION: Education details: See above Person educated: Patient Education method: Explanation Education comprehension: verbalized understanding  HOME EXERCISE PROGRAM: Standing PWR Moves  Access Code: [email or web address] URL: [email or web address] Date: 11/01/2022 Prepared by: Alethia Berthold Mychal Decarlo  Exercises - Standing on pillows/dog bed with eyes closed in corner   - 1 x daily - 7 x weekly - 3 sets - 30-45 second hold - Sit to Stand Without Arm Support  - 1 x daily - 7 x weekly - 3 sets - 10 reps  GOALS: Goals reviewed with patient? Yes  SHORT TERM GOALS: Target date: 11/19/2022  Pt will be independent with initial HEP for PD in order to build upon functional gains made in therapy.  Baseline: pt reports exercise doing well  Goal status: MET  2.  miniBEST to be assessed with LTG written.  Baseline: 18/28 Goal status: MET  3.  Pt and pt's spouse will verbalize understanding of fall prevention in the home.  Baseline: handout provided Goal status: MET  4.  Pt will improve gait speed with LRAD vs. No AD to at least 2.8 ft/sec in order to demo improved community mobility.  Baseline: 12.97 seconds = 2.53 ft/sec   10.8 seconds = 3.1 ft/sec Goal status: MET   LONG TERM GOALS: Target date: 12/17/2022  Pt will be independent  with final HEP for PD in order to build upon functional gains made in therapy. Baseline:  Goal status: MET  2.  Pt will improve gait speed with LRAD vs. No AD to at least 3.3 ft/sec in order to demo improved community mobility. Baseline:  10.8 seconds = 3.1 ft/sec; 3.4 ft/s  Goal status: MET  3.  Pt will improve MiniBest to 22/28 for decreased fall risk and improvement with compensatory stepping strategies.   Baseline: 18/28; 22/28 Goal status: MET  4.  Pt will be able to get on and off the floor with supervision for improved fall recovery.  Baseline: mod I (11/18) Goal status: MET  5. Pt will verbalize understanding of local Parkinson's disease  resources, including options for continue community fitness.  Baseline:  Goal status: MET   ASSESSMENT:  CLINICAL IMPRESSION: Emphasis of skilled PT session on LTG assessment and DC from PT. Pt has met 5 of 5 LTGs, improving his gait speed w/o use of AD and performing fall recovery at mod I level. Pt improved his score on MiniBest to a 22/28 and is most challenged by cog dual-tasks and stepping strategies, especially to L side. Pt not interested in PD group fitness classes, but did verbalize understanding of the support group and game night options. Pt is a high fall risk per his multiple falls, most of which occur when pt is moving too quickly. Pt does not require AD for safety at this time. Pt in agreement to schedule 56mo PD screens.       OBJECTIVE IMPAIRMENTS: Abnormal gait, decreased activity tolerance, decreased balance, decreased coordination, decreased knowledge of condition, decreased knowledge of use of DME, difficulty walking, decreased strength, impaired flexibility, and postural dysfunction.   ACTIVITY LIMITATIONS: bending, stairs, transfers, and locomotion level  PARTICIPATION LIMITATIONS: community activity  PERSONAL FACTORS: Age, Behavior pattern, Past/current experiences, Time since onset of injury/illness/exacerbation, and 3+ comorbidities: Parkinson's disease with mouth tremor, shuffling gait, imbalance, hypophonia, dysphagia, decreased arm swing left side, decreased sense of smell, onset February 2022, Depression, History of TIA in Feb 2022, Lumbar degenerative disc disease, A fib, HLD, HTN  are also affecting patient's functional outcome.   REHAB POTENTIAL: Good  CLINICAL DECISION MAKING: Evolving/moderate complexity  EVALUATION COMPLEXITY: Moderate  PLAN:  PT FREQUENCY: 2x/week  PT DURATION: 8 weeks  PLANNED INTERVENTIONS: 97164- PT Re-evaluation, 97110-Therapeutic exercises, 97530- Therapeutic activity, 97112- Neuromuscular re-education, 97535- Self Care, 28413-  Manual therapy, [phone]- Gait training, Balance training, Stair training, and DME instructions    Jill Alexanders Takao Lizer, PT, DPT 12/10/2022, 2:50 PM

## 2022-12-13 ENCOUNTER — Ambulatory Visit: Payer: Medicare Other | Admitting: Physical Therapy

## 2023-01-03 ENCOUNTER — Other Ambulatory Visit (HOSPITAL_COMMUNITY)
Admission: RE | Admit: 2023-01-03 | Discharge: 2023-01-03 | Disposition: A | Discharge status: Home / Self Care | Payer: Self-pay | Source: Ambulatory Visit | Attending: Oncology | Admitting: Oncology

## 2023-01-03 DIAGNOSIS — Z006 Encounter for examination for normal comparison and control in clinical research program: Secondary | ICD-10-CM

## 2023-01-17 LAB — GENECONNECT MOLECULAR SCREEN: Genetic Analysis Overall Interpretation: NEGATIVE

## 2023-01-21 DIAGNOSIS — H02054 Trichiasis without entropian left upper eyelid: Secondary | ICD-10-CM | POA: Diagnosis not present

## 2023-01-21 DIAGNOSIS — H02055 Trichiasis without entropian left lower eyelid: Secondary | ICD-10-CM | POA: Diagnosis not present

## 2023-01-21 DIAGNOSIS — H524 Presbyopia: Secondary | ICD-10-CM | POA: Diagnosis not present

## 2023-01-21 DIAGNOSIS — H01002 Unspecified blepharitis right lower eyelid: Secondary | ICD-10-CM | POA: Diagnosis not present

## 2023-01-21 DIAGNOSIS — H01005 Unspecified blepharitis left lower eyelid: Secondary | ICD-10-CM | POA: Diagnosis not present

## 2023-01-21 DIAGNOSIS — Z961 Presence of intraocular lens: Secondary | ICD-10-CM | POA: Diagnosis not present

## 2023-01-24 DIAGNOSIS — R1319 Other dysphagia: Secondary | ICD-10-CM | POA: Diagnosis not present

## 2023-01-24 DIAGNOSIS — R2689 Other abnormalities of gait and mobility: Secondary | ICD-10-CM | POA: Diagnosis not present

## 2023-01-24 DIAGNOSIS — G20A1 Parkinson's disease without dyskinesia, without mention of fluctuations: Secondary | ICD-10-CM | POA: Diagnosis not present

## 2023-01-24 DIAGNOSIS — Z8673 Personal history of transient ischemic attack (TIA), and cerebral infarction without residual deficits: Secondary | ICD-10-CM | POA: Diagnosis not present

## 2023-01-24 DIAGNOSIS — R296 Repeated falls: Secondary | ICD-10-CM | POA: Diagnosis not present

## 2023-02-18 ENCOUNTER — Other Ambulatory Visit: Payer: Self-pay | Admitting: Gastroenterology

## 2023-02-18 DIAGNOSIS — R1311 Dysphagia, oral phase: Secondary | ICD-10-CM | POA: Diagnosis not present

## 2023-02-28 ENCOUNTER — Other Ambulatory Visit: Payer: Medicare Other

## 2023-03-22 ENCOUNTER — Ambulatory Visit
Admission: RE | Admit: 2023-03-22 | Discharge: 2023-03-22 | Disposition: A | Discharge status: Home / Self Care | Payer: Medicare Other | Source: Ambulatory Visit | Attending: Gastroenterology | Admitting: Gastroenterology

## 2023-03-22 DIAGNOSIS — R1311 Dysphagia, oral phase: Secondary | ICD-10-CM

## 2023-03-22 DIAGNOSIS — K2289 Other specified disease of esophagus: Secondary | ICD-10-CM | POA: Diagnosis not present

## 2023-03-22 DIAGNOSIS — R1319 Other dysphagia: Secondary | ICD-10-CM | POA: Diagnosis not present

## 2023-04-22 ENCOUNTER — Other Ambulatory Visit (HOSPITAL_BASED_OUTPATIENT_CLINIC_OR_DEPARTMENT_OTHER): Payer: Self-pay

## 2023-04-22 MED ORDER — COVID-19 MRNA VAC-TRIS(PFIZER) 30 MCG/0.3ML IM SUSY
0.3000 mL | PREFILLED_SYRINGE | Freq: Once | INTRAMUSCULAR | 0 refills | Status: AC
  Filled 2023-04-22: qty 0.3, 1d supply, fill #0

## 2023-06-14 ENCOUNTER — Ambulatory Visit: Payer: Medicare Other

## 2023-06-14 ENCOUNTER — Ambulatory Visit: Payer: Medicare Other | Admitting: Occupational Therapy

## 2023-06-14 ENCOUNTER — Ambulatory Visit: Payer: Medicare Other | Attending: Family Medicine | Admitting: Physical Therapy

## 2023-06-14 DIAGNOSIS — R29818 Other symptoms and signs involving the nervous system: Secondary | ICD-10-CM

## 2023-06-14 DIAGNOSIS — M6281 Muscle weakness (generalized): Secondary | ICD-10-CM | POA: Insufficient documentation

## 2023-06-14 DIAGNOSIS — R471 Dysarthria and anarthria: Secondary | ICD-10-CM

## 2023-06-14 DIAGNOSIS — R2681 Unsteadiness on feet: Secondary | ICD-10-CM | POA: Insufficient documentation

## 2023-06-14 NOTE — Therapy (Signed)
 [facility] [address] [place], [place], [address] Phone: [phone]   Fax:  [phone]  Patient Details  Name: Jose Fuentes MRN: [id number] Date of Birth: [birth year] Referring Provider:  Victorio Grave, MD  Encounter Date: 06/14/2023  Physical Therapy Parkinson's Disease Screen   Timed Up and Go test:12.97s (previously 9.28s)   10 meter walk test:32.8' over 9.47s = 3.46 ft/s (previously 3.4 ft/s)   5 time sit to stand test:13.82s (previously 12.1s)   Pt reports his balance is not improving and he has had several falls, some more serious than others. Busted his eyebrow open and had a plastic shard go through his cheek. Has joined the [facility] and does a lot of walking. Stopped taking the Carbidopa-Levidopa because it made him too fatigued. Is now fatigued all the time. Is sleeping through the night and taking multiple naps during the day.   Patient would benefit from Physical Therapy evaluation due to frequent falls and impaired balance.    Curtistine Downer Yvonnia Tango, PT 06/14/2023, 10:23 AM  [facility] [address] [place], [place], [address] Phone: [phone]   Fax:  [phone]

## 2023-06-14 NOTE — Therapy (Signed)
 Occupational Therapy Parkinson's Disease Screen  Hand dominance:  Left   Physical Performance Test item #2 (simulated eating):  11 sec  Physical Performance Test item #4 (donning/doffing jacket):  15 sec  Fastening/unfastening 3 buttons in:  35 sec  9-hole peg test:    RUE  32 sec        LUE  28 sec  Box & Blocks Test:   RUE  45 blocks        LUE  53 blocks   Change in ability to perform ADLs/IADLs:  He has had increased difficulty with buttons and with handwriting.   Other Comments:  Reports chronic tendon injury to L index finger, which does affect fine motor coordination.   Pt would benefit from occupational therapy evaluation for establishment of PD HEP and strategies as needed He has had increased difficulty with buttons and with handwriting.

## 2023-06-14 NOTE — Therapy (Signed)
 [facility] [address] [place], [place], [address] Phone: [phone]   Fax:  [phone]  Patient Details  Name: Jose Fuentes MRN: [id number] Date of Birth: 04/18/1940 Referring Provider:  Paich, Kaitlin, PA-C  (Reported Mason Sole as primary neurologist)  Encounter Date: 06/14/2023  Speech Therapy Parkinson's Disease Screen   Decibel Level today: 68dB  (WNL=70-72 dB) with sound level meter 30cm away from pt's mouth. Pt endorsed more frequent stammering compared to prior baseline.   Pt does report previous difficulty swallowing; however, recently dx with presbyesophagus and feels he is able to swallow effectively with self-implemented double swallows and liquid washes. Educated patient on plan to monitor in case of oropharygneal dysphagia.   Pt does not endorse significant changes in cognition. Pt reports some forgetfulness but correlated to age. Wife manages majority of household tasks (meds, bills, directions).   Pt would benefit from dysarthria evaluation - please order via EPIC    Tamar Fairly, CCC-SLP 06/14/2023, 10:27 AM  [facility] [address] [place], [place], [address] Phone: [phone]   Fax:  [phone]

## 2023-07-25 DIAGNOSIS — G249 Dystonia, unspecified: Secondary | ICD-10-CM | POA: Diagnosis not present

## 2023-07-25 DIAGNOSIS — Z1331 Encounter for screening for depression: Secondary | ICD-10-CM | POA: Diagnosis not present

## 2023-07-25 DIAGNOSIS — R296 Repeated falls: Secondary | ICD-10-CM | POA: Diagnosis not present

## 2023-07-26 ENCOUNTER — Ambulatory Visit: Admitting: Physical Therapy

## 2023-07-26 ENCOUNTER — Encounter

## 2023-07-26 ENCOUNTER — Encounter: Admitting: Occupational Therapy

## 2023-07-26 DIAGNOSIS — F3341 Major depressive disorder, recurrent, in partial remission: Secondary | ICD-10-CM | POA: Diagnosis not present

## 2023-08-19 ENCOUNTER — Encounter: Admitting: Occupational Therapy

## 2023-08-19 ENCOUNTER — Encounter: Admitting: Speech Pathology

## 2023-08-19 ENCOUNTER — Ambulatory Visit: Admitting: Physical Therapy

## 2023-08-30 IMAGING — MR MR LUMBAR SPINE W/O CM
5 of 6 series · 29 of 48 positions shown · non-contrast
Comparison: X-ray lumbar 05/23/2020.

CLINICAL DATA: Patient complains of low back pain radiating down to
bilateral thighs for 2 years. No known injury. Patient denies
cancer, therapeutic injections or surgeries.

EXAM:
MRI LUMBAR SPINE WITHOUT CONTRAST
TECHNIQUE: Multiplanar, multisequence MR imaging of the lumbar spine was
performed. No intravenous contrast was administered.

[Series 3: T2 · sagittal · 4.0mm · 1.09mm/px · 5 of 19 slices shown (1 of 3)]
[im 1/19]
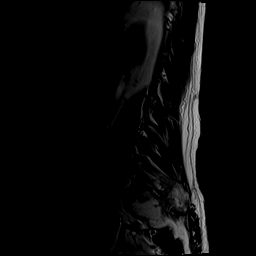
[im 5/19]
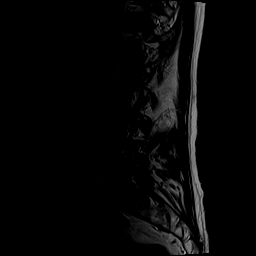
[im 10/19]
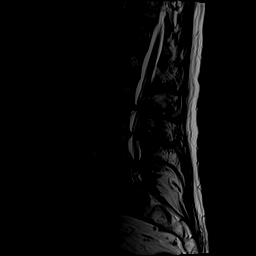
[im 14/19]
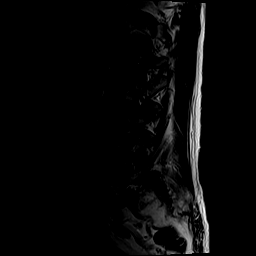
[im 19/19]
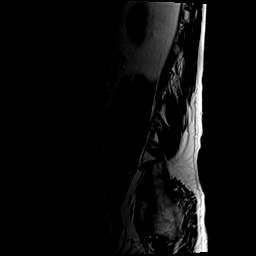

[Series 5: T1 · sagittal · 4.0mm · 1.09mm/px · 5 of 19 slices shown (1 of 2)]
[im 1/19]
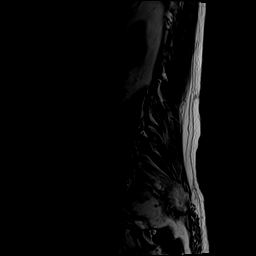
[im 5/19]
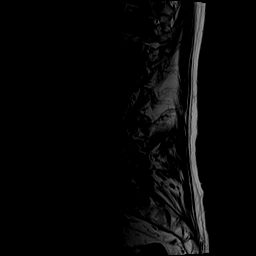
[im 10/19]
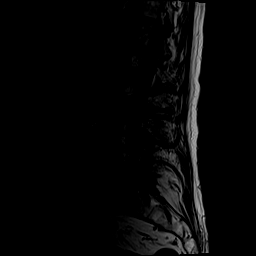
[im 14/19]
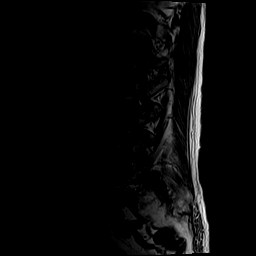
[im 19/19]
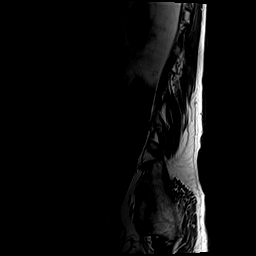

[Series 6: T2 · axial · 4.0mm · 0.39mm/px · z∈[-166,+80]mm · 8 of 49 slices shown (2 of 3)]
[im 1/49]
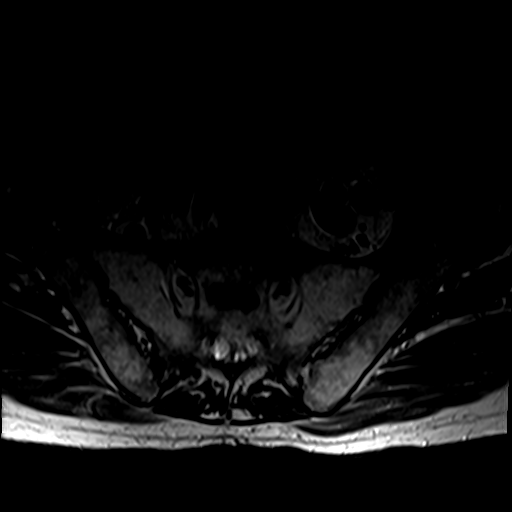
[im 8/49]
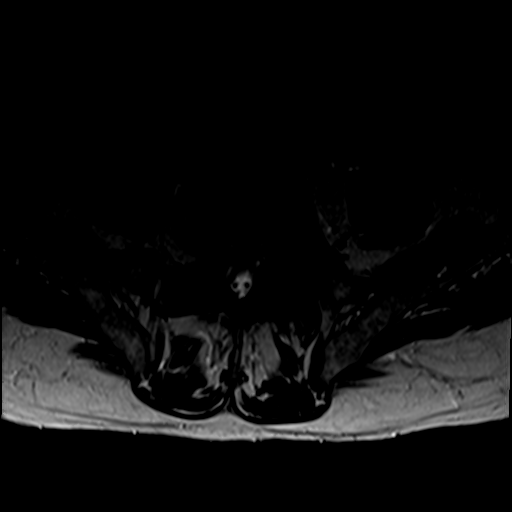
[im 15/49]
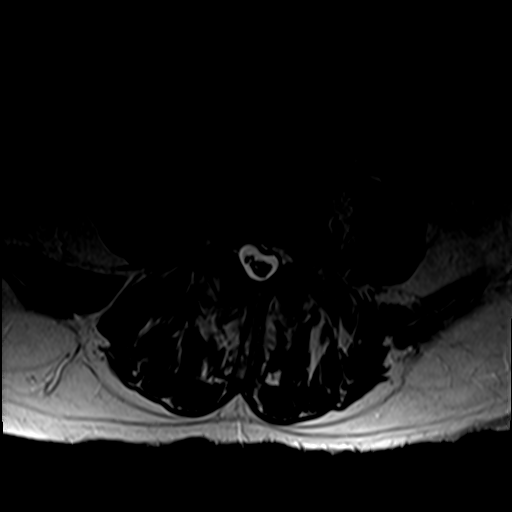
[im 23/49]
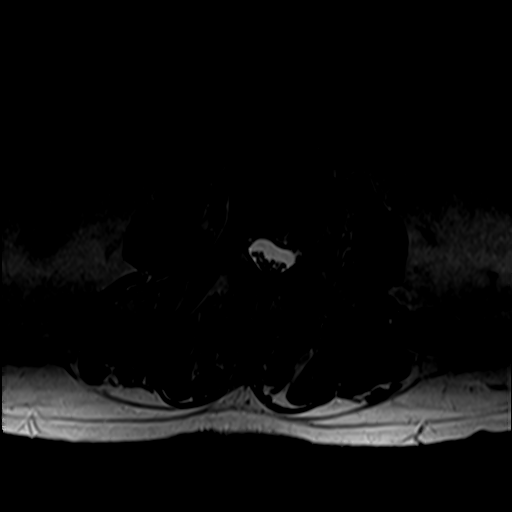
[im 26/49]
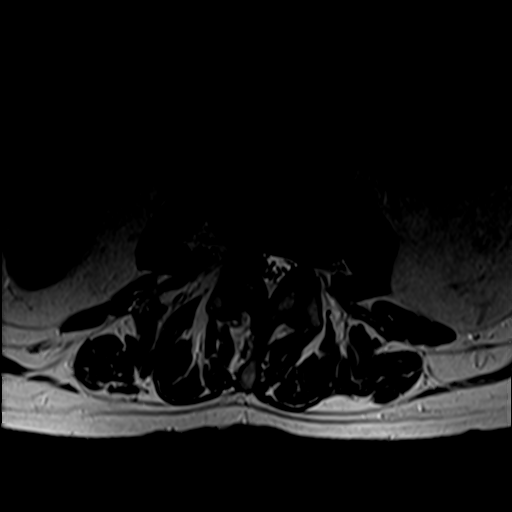
[im 34/49]
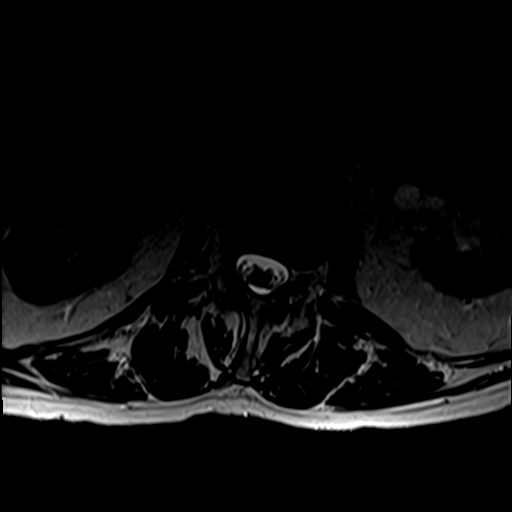
[im 41/49]
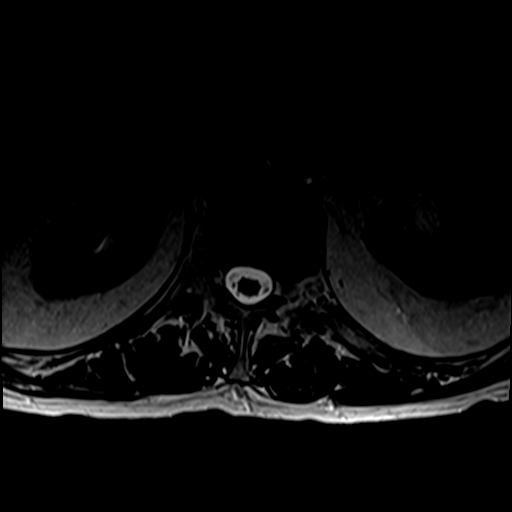
[im 49/49]
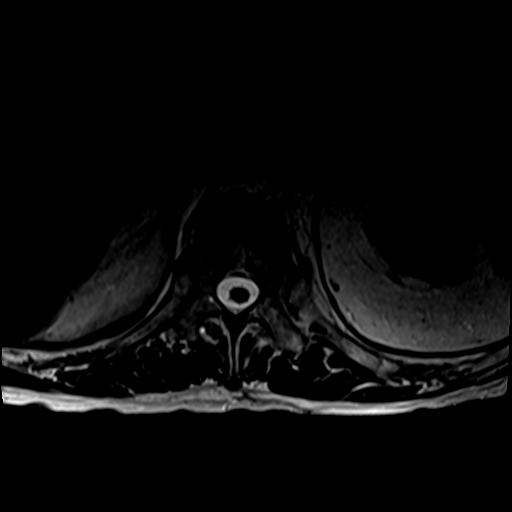

[Series 7: T1 · axial · 4.0mm · 0.39mm/px · z∈[-166,+9]mm · 6 of 49 slices shown (2 of 2)]
[im 1/49]
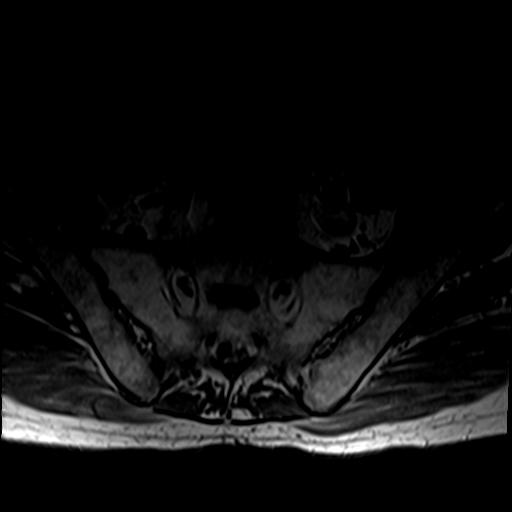
[im 8/49]
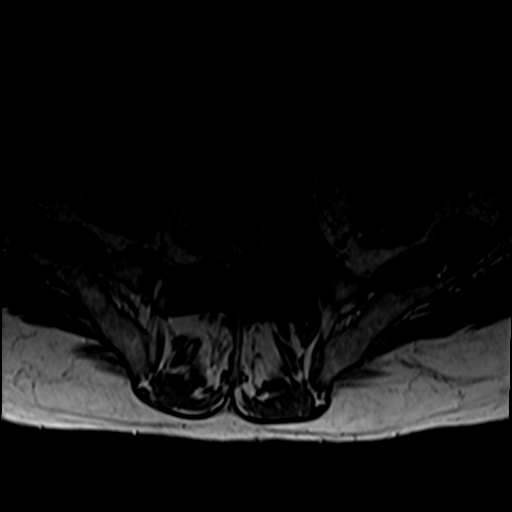
[im 15/49]
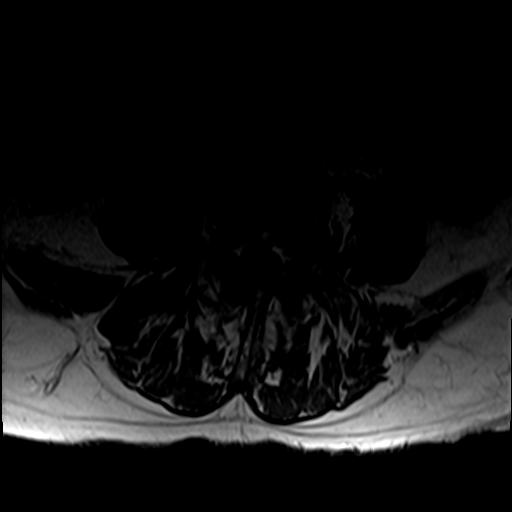
[im 23/49]
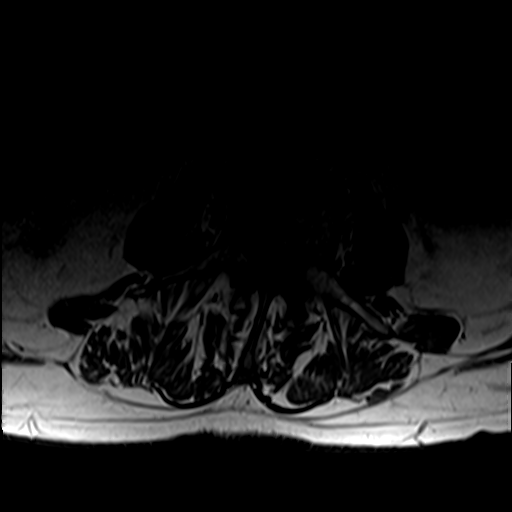
[im 26/49]
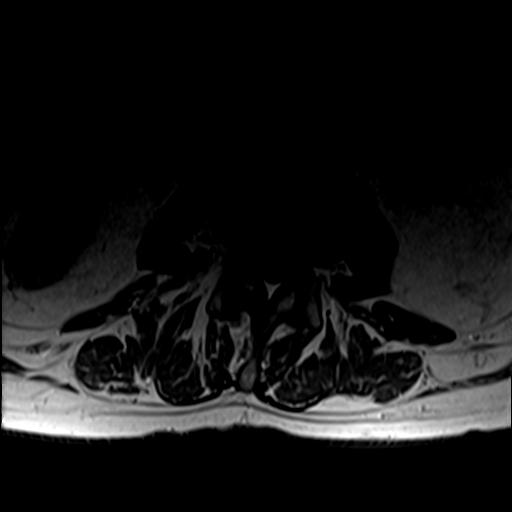
[im 34/49]
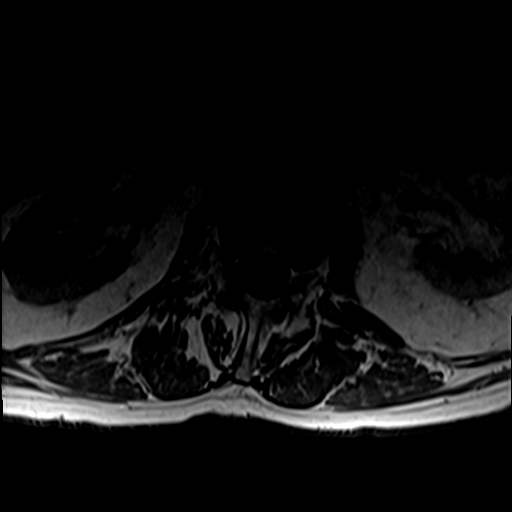

[Series 8: T2 · coronal · 4.0mm · 1.09mm/px · 5 of 19 slices shown (3 of 3)]
[im 1/19]
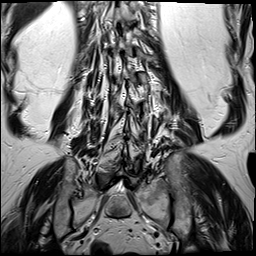
[im 5/19]
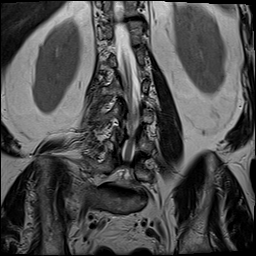
[im 10/19]
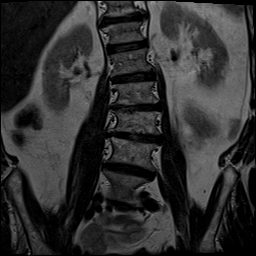
[im 14/19]
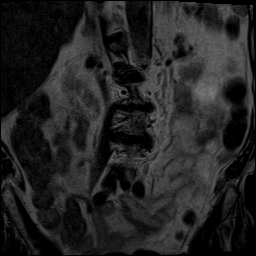
[im 19/19]
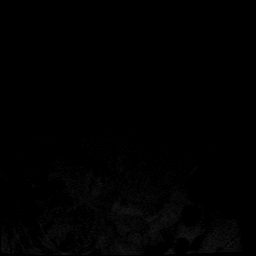

[29 of 48 positions shown; findings below may reference images not displayed]

FINDINGS: Segmentation:  Standard.

Alignment:  Physiologic.

Vertebrae: No acute fracture, evidence of discitis, or aggressive
bone lesion.

Conus medullaris and cauda equina: Conus extends to the L1-2 level.
Conus and cauda equina appear normal.

Paraspinal and other soft tissues: No acute paraspinal abnormality.

Disc levels:

Disc spaces: Degenerative disease with disc height loss throughout
the lumbar spine. Reactive endplate changes at L2-3.

T12-L1: No significant disc bulge. No neural foraminal stenosis. No
central canal stenosis.

L1-L2: No significant disc bulge. No neural foraminal stenosis. No
central canal stenosis. Mild bilateral facet arthropathy.

L2-L3: Broad-based disc osteophyte complex. Moderate bilateral facet
arthropathy. Mild-moderate spinal stenosis. Mild bilateral foraminal
stenosis.

L3-L4: Broad-based disc bulge. Moderate bilateral facet arthropathy.
Moderate spinal stenosis. Mild bilateral foraminal stenosis.

L4-L5: Broad-based disc bulge. Moderate bilateral facet arthropathy.
Severe spinal stenosis. Severe left foraminal stenosis. Mild right
foraminal stenosis.

L5-S1: Mild broad-based disc bulge. Severe left and moderate right
facet arthropathy. Severe right foraminal stenosis. No left
foraminal stenosis. Mild spinal stenosis. In
IMPRESSION: 1. Lumbar spine spondylosis as described above.
2. No acute osseous injury of the lumbar spine.

## 2023-09-19 DIAGNOSIS — G20C Parkinsonism, unspecified: Secondary | ICD-10-CM | POA: Diagnosis not present

## 2023-09-19 DIAGNOSIS — R2681 Unsteadiness on feet: Secondary | ICD-10-CM | POA: Diagnosis not present

## 2023-09-19 DIAGNOSIS — R2689 Other abnormalities of gait and mobility: Secondary | ICD-10-CM | POA: Diagnosis not present

## 2023-09-23 ENCOUNTER — Other Ambulatory Visit (HOSPITAL_BASED_OUTPATIENT_CLINIC_OR_DEPARTMENT_OTHER): Payer: Self-pay

## 2023-09-23 DIAGNOSIS — Z23 Encounter for immunization: Secondary | ICD-10-CM | POA: Diagnosis not present

## 2023-09-23 MED ORDER — FLUZONE HIGH-DOSE 0.5 ML IM SUSY
0.5000 mL | PREFILLED_SYRINGE | Freq: Once | INTRAMUSCULAR | 0 refills | Status: AC
  Filled 2023-09-23: qty 0.5, 1d supply, fill #0

## 2023-09-23 MED ORDER — COMIRNATY 30 MCG/0.3ML IM SUSY
0.3000 mL | PREFILLED_SYRINGE | Freq: Once | INTRAMUSCULAR | 0 refills | Status: AC
  Filled 2023-09-23: qty 0.3, 1d supply, fill #0
